# Patient Record
Sex: Female | Born: 1935 | Race: White | Hispanic: No | Smoking: Never smoker
Health system: Southern US, Community
[De-identification: ages and names within clinical notes are randomized; demographics above are authoritative.]

## PROBLEM LIST (undated history)

## (undated) DIAGNOSIS — M1990 Unspecified osteoarthritis, unspecified site: Secondary | ICD-10-CM

## (undated) DIAGNOSIS — E785 Hyperlipidemia, unspecified: Secondary | ICD-10-CM

## (undated) DIAGNOSIS — E669 Obesity, unspecified: Secondary | ICD-10-CM

## (undated) DIAGNOSIS — J309 Allergic rhinitis, unspecified: Secondary | ICD-10-CM

## (undated) DIAGNOSIS — I10 Essential (primary) hypertension: Secondary | ICD-10-CM

## (undated) DIAGNOSIS — I890 Lymphedema, not elsewhere classified: Secondary | ICD-10-CM

## (undated) DIAGNOSIS — N183 Chronic kidney disease, stage 3 unspecified: Secondary | ICD-10-CM

## (undated) DIAGNOSIS — Z0389 Encounter for observation for other suspected diseases and conditions ruled out: Secondary | ICD-10-CM

## (undated) DIAGNOSIS — Z9289 Personal history of other medical treatment: Secondary | ICD-10-CM

## (undated) DIAGNOSIS — I480 Paroxysmal atrial fibrillation: Secondary | ICD-10-CM

## (undated) DIAGNOSIS — F0390 Unspecified dementia without behavioral disturbance: Secondary | ICD-10-CM

## (undated) DIAGNOSIS — C541 Malignant neoplasm of endometrium: Secondary | ICD-10-CM

## (undated) DIAGNOSIS — H269 Unspecified cataract: Secondary | ICD-10-CM

## (undated) DIAGNOSIS — K219 Gastro-esophageal reflux disease without esophagitis: Secondary | ICD-10-CM

## (undated) HISTORY — PX: CHOLECYSTECTOMY: SHX55

## (undated) HISTORY — PX: BACK SURGERY: SHX140

## (undated) HISTORY — DX: Malignant neoplasm of endometrium: C54.1

## (undated) HISTORY — DX: Hyperlipidemia, unspecified: E78.5

## (undated) HISTORY — DX: Gastro-esophageal reflux disease without esophagitis: K21.9

## (undated) HISTORY — DX: Chronic kidney disease, stage 3 (moderate): N18.3

## (undated) HISTORY — DX: Unspecified cataract: H26.9

## (undated) HISTORY — DX: Chronic kidney disease, stage 3 unspecified: N18.30

## (undated) HISTORY — DX: Unspecified osteoarthritis, unspecified site: M19.90

## (undated) HISTORY — PX: VAGINAL HYSTERECTOMY: SUR661

## (undated) HISTORY — DX: Obesity, unspecified: E66.9

## (undated) HISTORY — DX: Paroxysmal atrial fibrillation: I48.0

## (undated) HISTORY — DX: Allergic rhinitis, unspecified: J30.9

## (undated) HISTORY — DX: Essential (primary) hypertension: I10

## (undated) HISTORY — DX: Personal history of other medical treatment: Z92.89

## (undated) HISTORY — DX: Lymphedema, not elsewhere classified: I89.0

## (undated) HISTORY — PX: APPENDECTOMY: SHX54

## (undated) HISTORY — DX: Encounter for observation for other suspected diseases and conditions ruled out: Z03.89

---

## 1998-03-18 ENCOUNTER — Other Ambulatory Visit: Admission: RE | Admit: 1998-03-18 | Discharge: 1998-03-18 | Payer: Self-pay | Admitting: Gynecology

## 1999-06-17 ENCOUNTER — Other Ambulatory Visit: Admission: RE | Admit: 1999-06-17 | Discharge: 1999-06-17 | Payer: Self-pay | Admitting: Obstetrics and Gynecology

## 2000-03-11 ENCOUNTER — Inpatient Hospital Stay (HOSPITAL_COMMUNITY): Admission: EM | Admit: 2000-03-11 | Discharge: 2000-03-14 | Payer: Self-pay | Admitting: Emergency Medicine

## 2000-03-11 ENCOUNTER — Encounter: Payer: Self-pay | Admitting: Family Medicine

## 2000-03-13 ENCOUNTER — Encounter: Payer: Self-pay | Admitting: Internal Medicine

## 2000-04-11 ENCOUNTER — Encounter: Admission: RE | Admit: 2000-04-11 | Discharge: 2000-07-10 | Payer: Self-pay | Admitting: *Deleted

## 2000-05-06 ENCOUNTER — Ambulatory Visit (HOSPITAL_COMMUNITY): Admission: RE | Admit: 2000-05-06 | Discharge: 2000-05-06 | Payer: Self-pay | Admitting: Internal Medicine

## 2000-05-06 ENCOUNTER — Encounter: Payer: Self-pay | Admitting: Internal Medicine

## 2000-08-19 ENCOUNTER — Other Ambulatory Visit: Admission: RE | Admit: 2000-08-19 | Discharge: 2000-08-19 | Payer: Self-pay | Admitting: Gynecology

## 2001-01-08 ENCOUNTER — Emergency Department (HOSPITAL_COMMUNITY): Admission: EM | Admit: 2001-01-08 | Discharge: 2001-01-08 | Payer: Self-pay | Admitting: Emergency Medicine

## 2001-01-08 ENCOUNTER — Encounter: Payer: Self-pay | Admitting: Emergency Medicine

## 2001-06-26 ENCOUNTER — Encounter: Payer: Self-pay | Admitting: Family Medicine

## 2001-06-26 ENCOUNTER — Ambulatory Visit (HOSPITAL_COMMUNITY): Admission: RE | Admit: 2001-06-26 | Discharge: 2001-06-26 | Payer: Self-pay | Admitting: Family Medicine

## 2001-11-06 ENCOUNTER — Encounter: Payer: Self-pay | Admitting: Family Medicine

## 2001-11-06 ENCOUNTER — Other Ambulatory Visit: Admission: RE | Admit: 2001-11-06 | Discharge: 2001-11-06 | Payer: Self-pay | Admitting: Family Medicine

## 2001-11-06 ENCOUNTER — Ambulatory Visit (HOSPITAL_COMMUNITY): Admission: RE | Admit: 2001-11-06 | Discharge: 2001-11-06 | Payer: Self-pay | Admitting: Family Medicine

## 2001-11-16 ENCOUNTER — Ambulatory Visit (HOSPITAL_COMMUNITY): Admission: RE | Admit: 2001-11-16 | Discharge: 2001-11-16 | Payer: Self-pay | Admitting: Cardiology

## 2001-11-17 ENCOUNTER — Ambulatory Visit (HOSPITAL_COMMUNITY): Admission: RE | Admit: 2001-11-17 | Discharge: 2001-11-17 | Payer: Self-pay | Admitting: Cardiology

## 2002-03-18 ENCOUNTER — Ambulatory Visit (HOSPITAL_BASED_OUTPATIENT_CLINIC_OR_DEPARTMENT_OTHER): Admission: RE | Admit: 2002-03-18 | Discharge: 2002-03-18 | Payer: Self-pay | Admitting: Family Medicine

## 2002-12-04 ENCOUNTER — Encounter: Admission: RE | Admit: 2002-12-04 | Discharge: 2003-03-04 | Payer: Self-pay | Admitting: Family Medicine

## 2003-06-06 ENCOUNTER — Encounter: Admission: RE | Admit: 2003-06-06 | Discharge: 2003-09-04 | Payer: Self-pay | Admitting: Family Medicine

## 2003-07-31 ENCOUNTER — Ambulatory Visit (HOSPITAL_BASED_OUTPATIENT_CLINIC_OR_DEPARTMENT_OTHER): Admission: RE | Admit: 2003-07-31 | Discharge: 2003-07-31 | Payer: Self-pay | Admitting: Orthopedic Surgery

## 2003-07-31 ENCOUNTER — Ambulatory Visit (HOSPITAL_COMMUNITY): Admission: RE | Admit: 2003-07-31 | Discharge: 2003-07-31 | Payer: Self-pay | Admitting: Orthopedic Surgery

## 2004-06-04 ENCOUNTER — Other Ambulatory Visit: Admission: RE | Admit: 2004-06-04 | Discharge: 2004-06-04 | Payer: Self-pay | Admitting: Family Medicine

## 2008-03-15 DIAGNOSIS — C541 Malignant neoplasm of endometrium: Secondary | ICD-10-CM

## 2008-03-15 HISTORY — DX: Malignant neoplasm of endometrium: C54.1

## 2009-01-08 ENCOUNTER — Encounter (INDEPENDENT_AMBULATORY_CARE_PROVIDER_SITE_OTHER): Payer: Self-pay | Admitting: *Deleted

## 2009-01-11 ENCOUNTER — Inpatient Hospital Stay (HOSPITAL_COMMUNITY): Admission: EM | Admit: 2009-01-11 | Discharge: 2009-01-12 | Payer: Self-pay | Admitting: Emergency Medicine

## 2009-01-12 ENCOUNTER — Encounter (INDEPENDENT_AMBULATORY_CARE_PROVIDER_SITE_OTHER): Payer: Self-pay | Admitting: Internal Medicine

## 2009-02-25 ENCOUNTER — Encounter (INDEPENDENT_AMBULATORY_CARE_PROVIDER_SITE_OTHER): Payer: Self-pay | Admitting: *Deleted

## 2009-03-04 ENCOUNTER — Ambulatory Visit: Payer: Self-pay | Admitting: Internal Medicine

## 2009-03-04 ENCOUNTER — Encounter (INDEPENDENT_AMBULATORY_CARE_PROVIDER_SITE_OTHER): Payer: Self-pay | Admitting: *Deleted

## 2009-03-05 ENCOUNTER — Telehealth (INDEPENDENT_AMBULATORY_CARE_PROVIDER_SITE_OTHER): Payer: Self-pay | Admitting: *Deleted

## 2009-03-15 DIAGNOSIS — IMO0001 Reserved for inherently not codable concepts without codable children: Secondary | ICD-10-CM

## 2009-03-15 HISTORY — DX: Reserved for inherently not codable concepts without codable children: IMO0001

## 2009-03-20 ENCOUNTER — Ambulatory Visit: Payer: Self-pay | Admitting: Internal Medicine

## 2009-05-05 ENCOUNTER — Encounter (INDEPENDENT_AMBULATORY_CARE_PROVIDER_SITE_OTHER): Payer: Self-pay | Admitting: Internal Medicine

## 2009-06-13 HISTORY — PX: CARDIAC CATHETERIZATION: SHX172

## 2009-06-19 ENCOUNTER — Emergency Department (HOSPITAL_COMMUNITY): Admission: EM | Admit: 2009-06-19 | Discharge: 2009-06-19 | Payer: Self-pay | Admitting: Emergency Medicine

## 2009-07-01 ENCOUNTER — Inpatient Hospital Stay (HOSPITAL_BASED_OUTPATIENT_CLINIC_OR_DEPARTMENT_OTHER): Admission: RE | Admit: 2009-07-01 | Discharge: 2009-07-01 | Payer: Self-pay | Admitting: Cardiovascular Disease

## 2009-08-07 ENCOUNTER — Ambulatory Visit (HOSPITAL_COMMUNITY): Admission: RE | Admit: 2009-08-07 | Discharge: 2009-08-07 | Payer: Self-pay | Admitting: Family Medicine

## 2009-08-12 ENCOUNTER — Encounter: Admission: RE | Admit: 2009-08-12 | Discharge: 2009-08-12 | Payer: Self-pay | Admitting: Orthopedic Surgery

## 2009-08-15 ENCOUNTER — Inpatient Hospital Stay (HOSPITAL_COMMUNITY): Admission: RE | Admit: 2009-08-15 | Discharge: 2009-08-19 | Payer: Self-pay | Admitting: Specialist

## 2009-08-18 ENCOUNTER — Ambulatory Visit: Payer: Self-pay | Admitting: Physical Medicine & Rehabilitation

## 2009-11-10 ENCOUNTER — Encounter: Admission: RE | Admit: 2009-11-10 | Discharge: 2009-11-24 | Payer: Self-pay | Admitting: Specialist

## 2010-01-20 ENCOUNTER — Ambulatory Visit: Payer: Self-pay | Admitting: Cardiovascular Disease

## 2010-02-19 ENCOUNTER — Inpatient Hospital Stay (HOSPITAL_COMMUNITY): Admission: EM | Admit: 2010-02-19 | Discharge: 2009-05-06 | Payer: Self-pay | Admitting: Emergency Medicine

## 2010-04-16 NOTE — Procedures (Signed)
Summary: Colonoscopy  Patient: [name] Note: All result statuses are Final unless otherwise noted.  Tests: (1) Colonoscopy (COL)   COL Colonoscopy           DONE     [facility]     [address]     [place], [place]  [address]           COLONOSCOPY PROCEDURE REPORT           PATIENT:  Janice [name], Janice [name]  MR#:  [id number]     BIRTHDATE:  [birth year], 77 yrs. old  GENDER:  female           ENDOSCOPIST:  Hedwig Morton. Juanda Chance, MD     Referred by:  Laurann Montana, M.D.           PROCEDURE DATE:  03/20/2009     PROCEDURE:  Colonoscopy 19147     ASA CLASS:  Class II     INDICATIONS:  history of polyps, family history of colon cancer     mother and 2 sisters with colon cancer,     prior colon [phone] with polyps,     last colon 2003 no polyps     hx of uterine cancer, s/p radiation           MEDICATIONS:   Versed 5 mg, Fentanyl 50 mcg           DESCRIPTION OF PROCEDURE:   After the risks benefits and     alternatives of the procedure were thoroughly explained, informed     consent was obtained.  Digital rectal exam was performed and     revealed no rectal masses.   The LB [id number]-[id number] endoscope     was introduced through the anus and advanced to the cecum, which     was identified by both the appendix and ileocecal valve, without     limitations.  The quality of the prep was good, using MiraLax.     The instrument was then slowly withdrawn as the colon was fully     examined.     <<PROCEDUREIMAGES>>           FINDINGS:  No polyps or cancers were seen (see image1, image2,     image3, and image4).   Retroflexed views in the rectum revealed no     abnormalities.    The scope was then withdrawn from the patient     and the procedure completed.           COMPLICATIONS:  None           ENDOSCOPIC IMPRESSION:     1) No polyps or cancers     2) Normal colonoscopy     RECOMMENDATIONS:     1) high fiber diet           REPEAT EXAM:  In 5 year(s) for.        ______________________________     Hedwig Morton. Juanda Chance, MD           CC:           n.     eSIGNED:   Hedwig Morton. [place] at 03/20/2009 10:27 AM           Almedia Balls, [id number]  Note: An exclamation mark (!) indicates a result that was not dispersed into the flowsheet. Document Creation Date: 03/20/2009 1:00 PM _______________________________________________________________________  (1) Order result status: Final Collection or observation date-time: 03/20/2009 10:20 Requested date-time:  Receipt date-time:  Reported date-time:  Referring Physician:   Ordering Physician: Lina Sar [phone]) Specimen Source:  Source: Launa Grill Order Number: [id number] Lab site:   Appended Document: Colonoscopy    Clinical Lists Changes  Observations: Added new observation of COLONNXTDUE: 03/2014 (03/20/2009 13:22)

## 2010-05-30 LAB — GLUCOSE, CAPILLARY
Glucose-Capillary: 105 mg/dL — ABNORMAL HIGH (ref 70–99)
Glucose-Capillary: 107 mg/dL — ABNORMAL HIGH (ref 70–99)

## 2010-06-01 LAB — URINE MICROSCOPIC-ADD ON

## 2010-06-01 LAB — GLUCOSE, CAPILLARY
Glucose-Capillary: 102 mg/dL — ABNORMAL HIGH (ref 70–99)
Glucose-Capillary: 104 mg/dL — ABNORMAL HIGH (ref 70–99)
Glucose-Capillary: 111 mg/dL — ABNORMAL HIGH (ref 70–99)
Glucose-Capillary: 112 mg/dL — ABNORMAL HIGH (ref 70–99)
Glucose-Capillary: 115 mg/dL — ABNORMAL HIGH (ref 70–99)
Glucose-Capillary: 116 mg/dL — ABNORMAL HIGH (ref 70–99)
Glucose-Capillary: 117 mg/dL — ABNORMAL HIGH (ref 70–99)
Glucose-Capillary: 124 mg/dL — ABNORMAL HIGH (ref 70–99)
Glucose-Capillary: 137 mg/dL — ABNORMAL HIGH (ref 70–99)
Glucose-Capillary: 147 mg/dL — ABNORMAL HIGH (ref 70–99)

## 2010-06-01 LAB — COMPREHENSIVE METABOLIC PANEL
ALT: 96 U/L — ABNORMAL HIGH (ref 0–35)
Alkaline Phosphatase: 53 U/L (ref 39–117)
BUN: 31 mg/dL — ABNORMAL HIGH (ref 6–23)
CO2: 27 mEq/L (ref 19–32)
Chloride: 97 mEq/L (ref 96–112)
GFR calc non Af Amer: 54 mL/min — ABNORMAL LOW (ref >60)
Potassium: 4.3 mEq/L (ref 3.5–5.1)
Sodium: 132 mEq/L — ABNORMAL LOW (ref 135–145)
Total Bilirubin: 0.6 mg/dL (ref 0.3–1.2)
Total Protein: 6.6 g/dL (ref 6.0–8.3)

## 2010-06-01 LAB — URINALYSIS, ROUTINE W REFLEX MICROSCOPIC
Bilirubin Urine: NEGATIVE
Ketones, ur: NEGATIVE mg/dL
Nitrite: NEGATIVE
Protein, ur: NEGATIVE mg/dL

## 2010-06-01 LAB — CBC
Platelets: 231 10*3/uL (ref 150–400)
RBC: 4.21 MIL/uL (ref 3.87–5.11)
RDW: 14.4 % (ref 11.5–15.5)

## 2010-06-01 LAB — PROTIME-INR
INR: 1.19 (ref 0.00–1.49)
Prothrombin Time: 15 seconds (ref 11.6–15.2)

## 2010-06-01 LAB — HEMOGLOBIN AND HEMATOCRIT, BLOOD
HCT: 29.1 % — ABNORMAL LOW (ref 36.0–46.0)
Hemoglobin: 10.1 g/dL — ABNORMAL LOW (ref 12.0–15.0)

## 2010-06-01 LAB — HEMOGLOBIN A1C
Hgb A1c MFr Bld: 5.9 % — ABNORMAL HIGH (ref <5.7)
Mean Plasma Glucose: 123 mg/dL — ABNORMAL HIGH (ref <117)

## 2010-06-01 LAB — CARDIAC PANEL(CRET KIN+CKTOT+MB+TROPI)
CK, MB: 1.6 ng/mL (ref 0.3–4.0)
Relative Index: INVALID (ref 0.0–2.5)
Troponin I: 0.01 ng/mL (ref 0.00–0.06)

## 2010-06-01 LAB — ABO/RH: ABO/RH(D): O POS

## 2010-06-03 LAB — DIFFERENTIAL
Eosinophils Absolute: 0.1 10*3/uL (ref 0.0–0.7)
Eosinophils Relative: 2 % (ref 0–5)
Lymphs Abs: 2.6 10*3/uL (ref 0.7–4.0)
Monocytes Relative: 13 % — ABNORMAL HIGH (ref 3–12)
Neutrophils Relative %: 50 % (ref 43–77)

## 2010-06-03 LAB — PROTIME-INR: INR: 2.74 — ABNORMAL HIGH (ref 0.00–1.49)

## 2010-06-03 LAB — CBC
HCT: 33.5 % — ABNORMAL LOW (ref 36.0–46.0)
MCV: 90.5 fL (ref 78.0–100.0)
RBC: 3.7 MIL/uL — ABNORMAL LOW (ref 3.87–5.11)
WBC: 7.8 10*3/uL (ref 4.0–10.5)

## 2010-06-03 LAB — BASIC METABOLIC PANEL
Chloride: 101 mEq/L (ref 96–112)
GFR calc Af Amer: >60 mL/min (ref >60)
Potassium: 4.2 mEq/L (ref 3.5–5.1)
Sodium: 136 mEq/L (ref 135–145)

## 2010-06-03 LAB — POCT CARDIAC MARKERS
CKMB, poc: 1.1 ng/mL (ref 1.0–8.0)
Myoglobin, poc: 99.5 ng/mL (ref 12–200)

## 2010-06-03 LAB — BRAIN NATRIURETIC PEPTIDE: Pro B Natriuretic peptide (BNP): 39 pg/mL (ref 0.0–100.0)

## 2010-06-04 LAB — CBC
HCT: 33 % — ABNORMAL LOW (ref 36.0–46.0)
HCT: 33.5 % — ABNORMAL LOW (ref 36.0–46.0)
Hemoglobin: 11 g/dL — ABNORMAL LOW (ref 12.0–15.0)
MCHC: 33.2 g/dL (ref 30.0–36.0)
MCHC: 34.7 g/dL (ref 30.0–36.0)
MCV: 91 fL (ref 78.0–100.0)
MCV: 91 fL (ref 78.0–100.0)
Platelets: 128 10*3/uL — ABNORMAL LOW (ref 150–400)
Platelets: 142 10*3/uL — ABNORMAL LOW (ref 150–400)
Platelets: 168 10*3/uL (ref 150–400)
RBC: 3.63 MIL/uL — ABNORMAL LOW (ref 3.87–5.11)
RBC: 3.69 MIL/uL — ABNORMAL LOW (ref 3.87–5.11)
RBC: 3.75 MIL/uL — ABNORMAL LOW (ref 3.87–5.11)
RDW: 12.9 % (ref 11.5–15.5)
WBC: 8.2 10*3/uL (ref 4.0–10.5)
WBC: 8.7 10*3/uL (ref 4.0–10.5)

## 2010-06-04 LAB — DIFFERENTIAL
Lymphocytes Relative: 33 % (ref 12–46)
Lymphs Abs: 2.9 10*3/uL (ref 0.7–4.0)
Monocytes Absolute: 1 10*3/uL (ref 0.1–1.0)
Monocytes Relative: 12 % (ref 3–12)
Neutro Abs: 4.4 10*3/uL (ref 1.7–7.7)
Neutrophils Relative %: 50 % (ref 43–77)

## 2010-06-04 LAB — BASIC METABOLIC PANEL
BUN: 12 mg/dL (ref 6–23)
BUN: 18 mg/dL (ref 6–23)
CO2: 26 mEq/L (ref 19–32)
CO2: 28 mEq/L (ref 19–32)
Calcium: 8.8 mg/dL (ref 8.4–10.5)
Calcium: 9.1 mg/dL (ref 8.4–10.5)
Chloride: 101 mEq/L (ref 96–112)
Creatinine, Ser: 0.8 mg/dL (ref 0.4–1.2)
Creatinine, Ser: 0.93 mg/dL (ref 0.4–1.2)
GFR calc Af Amer: >60 mL/min (ref >60)
GFR calc Af Amer: >60 mL/min (ref >60)
Glucose, Bld: 101 mg/dL — ABNORMAL HIGH (ref 70–99)
Sodium: 136 mEq/L (ref 135–145)

## 2010-06-04 LAB — PROTIME-INR
INR: 1.32 (ref 0.00–1.49)
INR: 2.39 — ABNORMAL HIGH (ref 0.00–1.49)
Prothrombin Time: 14.3 seconds (ref 11.6–15.2)
Prothrombin Time: 16.3 seconds — ABNORMAL HIGH (ref 11.6–15.2)

## 2010-06-04 LAB — COMPREHENSIVE METABOLIC PANEL
AST: 25 U/L (ref 0–37)
BUN: 13 mg/dL (ref 6–23)
CO2: 26 mEq/L (ref 19–32)
Chloride: 107 mEq/L (ref 96–112)
Creatinine, Ser: 0.88 mg/dL (ref 0.4–1.2)
GFR calc non Af Amer: >60 mL/min (ref >60)
Glucose, Bld: 108 mg/dL — ABNORMAL HIGH (ref 70–99)
Total Bilirubin: 0.5 mg/dL (ref 0.3–1.2)

## 2010-06-04 LAB — POCT I-STAT, CHEM 8
Creatinine, Ser: 0.9 mg/dL (ref 0.4–1.2)
HCT: 34 % — ABNORMAL LOW (ref 36.0–46.0)
Hemoglobin: 11.6 g/dL — ABNORMAL LOW (ref 12.0–15.0)
Potassium: 3.5 mEq/L (ref 3.5–5.1)
Sodium: 134 mEq/L — ABNORMAL LOW (ref 135–145)

## 2010-06-04 LAB — HEPARIN LEVEL (UNFRACTIONATED)
Heparin Unfractionated: 0.72 IU/mL — ABNORMAL HIGH (ref 0.30–0.70)
Heparin Unfractionated: 1.25 IU/mL — ABNORMAL HIGH (ref 0.30–0.70)

## 2010-06-04 LAB — CK TOTAL AND CKMB (NOT AT ARMC)
Relative Index: INVALID (ref 0.0–2.5)
Relative Index: INVALID (ref 0.0–2.5)

## 2010-06-04 LAB — LIPID PANEL
Cholesterol: 116 mg/dL (ref 0–200)
HDL: 57 mg/dL (ref >39)
LDL Cholesterol: 49 mg/dL (ref 0–99)

## 2010-06-04 LAB — TSH: TSH: 2.382 u[IU]/mL (ref 0.350–4.500)

## 2010-06-04 LAB — TROPONIN I
Troponin I: 0.02 ng/mL (ref 0.00–0.06)
Troponin I: 0.03 ng/mL (ref 0.00–0.06)

## 2010-06-04 LAB — GLUCOSE, CAPILLARY
Glucose-Capillary: 107 mg/dL — ABNORMAL HIGH (ref 70–99)
Glucose-Capillary: 84 mg/dL (ref 70–99)
Glucose-Capillary: 91 mg/dL (ref 70–99)
Glucose-Capillary: 91 mg/dL (ref 70–99)

## 2010-06-04 LAB — POCT CARDIAC MARKERS
CKMB, poc: <1 ng/mL — ABNORMAL LOW (ref 1.0–8.0)
Myoglobin, poc: 112 ng/mL (ref 12–200)
Myoglobin, poc: 93.4 ng/mL (ref 12–200)

## 2010-06-18 LAB — GLUCOSE, CAPILLARY

## 2010-06-18 LAB — CARDIAC PANEL(CRET KIN+CKTOT+MB+TROPI)
CK, MB: 1 ng/mL (ref 0.3–4.0)
Relative Index: INVALID (ref 0.0–2.5)
Total CK: 60 U/L (ref 7–177)
Troponin I: 0.01 ng/mL (ref 0.00–0.06)

## 2010-06-18 LAB — DIFFERENTIAL
Basophils Absolute: 0 10*3/uL (ref 0.0–0.1)
Basophils Relative: 0 % (ref 0–1)
Eosinophils Absolute: 0.2 10*3/uL (ref 0.0–0.7)
Lymphs Abs: 2.2 10*3/uL (ref 0.7–4.0)
Neutrophils Relative %: 64 % (ref 43–77)

## 2010-06-18 LAB — BASIC METABOLIC PANEL
BUN: 19 mg/dL (ref 6–23)
Chloride: 103 mEq/L (ref 96–112)
Glucose, Bld: 104 mg/dL — ABNORMAL HIGH (ref 70–99)
Potassium: 3.8 mEq/L (ref 3.5–5.1)
Sodium: 137 mEq/L (ref 135–145)

## 2010-06-18 LAB — POCT I-STAT, CHEM 8
Calcium, Ion: 1.14 mmol/L (ref 1.12–1.32)
Chloride: 101 mEq/L (ref 96–112)
HCT: 41 % (ref 36.0–46.0)
Hemoglobin: 13.9 g/dL (ref 12.0–15.0)
TCO2: 25 mmol/L (ref 0–100)

## 2010-06-18 LAB — CBC
HCT: 34 % — ABNORMAL LOW (ref 36.0–46.0)
HCT: 39.7 % (ref 36.0–46.0)
Hemoglobin: 11.4 g/dL — ABNORMAL LOW (ref 12.0–15.0)
MCV: 92.2 fL (ref 78.0–100.0)
Platelets: 159 10*3/uL (ref 150–400)
Platelets: 196 10*3/uL (ref 150–400)
RDW: 13.3 % (ref 11.5–15.5)
WBC: 9.6 10*3/uL (ref 4.0–10.5)

## 2010-06-18 LAB — LIPID PANEL
Cholesterol: 109 mg/dL (ref 0–200)
LDL Cholesterol: 52 mg/dL (ref 0–99)

## 2010-06-18 LAB — CK TOTAL AND CKMB (NOT AT ARMC)
CK, MB: 1.4 ng/mL (ref 0.3–4.0)
Total CK: 78 U/L (ref 7–177)

## 2010-06-18 LAB — POCT CARDIAC MARKERS

## 2010-06-18 LAB — BRAIN NATRIURETIC PEPTIDE: Pro B Natriuretic peptide (BNP): <30 pg/mL (ref 0.0–100.0)

## 2010-06-18 LAB — TROPONIN I: Troponin I: 0.01 ng/mL (ref 0.00–0.06)

## 2010-07-16 ENCOUNTER — Encounter: Payer: Self-pay | Admitting: Cardiovascular Disease

## 2010-07-16 DIAGNOSIS — I4891 Unspecified atrial fibrillation: Secondary | ICD-10-CM | POA: Insufficient documentation

## 2010-07-16 DIAGNOSIS — R079 Chest pain, unspecified: Secondary | ICD-10-CM | POA: Insufficient documentation

## 2010-07-16 DIAGNOSIS — I10 Essential (primary) hypertension: Secondary | ICD-10-CM | POA: Insufficient documentation

## 2010-07-16 DIAGNOSIS — E785 Hyperlipidemia, unspecified: Secondary | ICD-10-CM | POA: Insufficient documentation

## 2010-07-16 DIAGNOSIS — E119 Type 2 diabetes mellitus without complications: Secondary | ICD-10-CM | POA: Insufficient documentation

## 2010-07-22 ENCOUNTER — Ambulatory Visit (INDEPENDENT_AMBULATORY_CARE_PROVIDER_SITE_OTHER): Payer: Medicare Other | Admitting: Cardiovascular Disease

## 2010-07-22 ENCOUNTER — Encounter: Payer: Self-pay | Admitting: Cardiovascular Disease

## 2010-07-22 VITALS — BP 110/74 | HR 62 | Wt 180.0 lb

## 2010-07-22 DIAGNOSIS — I10 Essential (primary) hypertension: Secondary | ICD-10-CM

## 2010-07-22 DIAGNOSIS — E785 Hyperlipidemia, unspecified: Secondary | ICD-10-CM

## 2010-07-22 DIAGNOSIS — I4891 Unspecified atrial fibrillation: Secondary | ICD-10-CM

## 2010-07-22 MED ORDER — DABIGATRAN ETEXILATE MESYLATE 150 MG PO CAPS: 150.0000 mg | ORAL_CAPSULE | Freq: Two times a day (BID) | ORAL | Status: DC

## 2010-07-22 NOTE — Progress Notes (Signed)
Janice [name] Date of Birth  27-Nov-1931 [facility] / [facility] [address]     [address] [place], [place]  [address] [phone]  Fax  [phone]  History of Present Illness:  Janice [name] is an elderly female with a history of atrial fibrillation, hypertension, and hyperlipidemia. She's been in normal sinus rhythm for the past several office visits. She's been exercising at the [facility] several times a week ( water aerobics).  No chest pain or dyspnea.  Occasionally has palpitations.    Current Outpatient Prescriptions on File Prior to Visit  Medication Sig Dispense Refill  . aspirin 81 MG tablet Take 81 mg by mouth daily.        . Cyanocobalamin (B-12 PO) Take by mouth daily.        . dabigatran (PRADAXA) 150 MG CAPS Take 150 mg by mouth every 12 (twelve) hours.        . Diclofenac Sodium (PENNSAID) 1.5 % SOLN Place onto the skin as needed.        . fluticasone (FLONASE) 50 MCG/ACT nasal spray 2 sprays by Nasal route as needed.        Marland Kitchen losartan-hydrochlorothiazide (HYZAAR) 100-25 MG per tablet Take 1 tablet by mouth daily.        . metFORMIN (GLUMETZA) 1000 MG (MOD) 24 hr tablet Take 1,000 mg by mouth 2 (two) times daily with a meal.       . metoprolol (LOPRESSOR) 50 MG tablet Take 25 mg by mouth 2 (two) times daily.       . Multiple Minerals (CALCIUM/MAGNESIUM/ZINC PO) Take by mouth daily.        . nitroGLYCERIN (NITROSTAT) 0.4 MG SL tablet Place 0.4 mg under the tongue every 5 (five) minutes as needed.        . ranitidine (ZANTAC) 300 MG tablet Take 300 mg by mouth as needed.        . simvastatin (ZOCOR) 80 MG tablet Take 20 mg by mouth at bedtime.        . methocarbamol (ROBAXIN) 500 MG tablet Take 500 mg by mouth as needed.          Allergies  Allergen Reactions  . Meperidine Hcl     REACTION: nausea and vomiting    Past Medical History  Diagnosis Date  . Atrial fibrillation   . Diabetes mellitus   . Hypertension   . Hyperlipidemia   . Chest  pain     Past Surgical History  Procedure Date  . Cardiac catheterization 06/2009    REVEALED SMOOTH AND NORMAL CORONARY ARTERIES  . Back surgery     History  Smoking status  . Never Smoker   Smokeless tobacco  . Not on file    History  Alcohol Use No    Family History  Problem Relation Age of Onset  . Colon cancer Mother   . Kidney cancer Father   . Lung cancer Sister     Reviw of Systems:  Reviewed in the HPI.  All other systems are negative.  Physical Exam: BP 110/74  Pulse 62  Wt 180 lb (81.647 kg) The patient is alert and oriented x 3.  The mood and affect are normal.  The skin is warm and dry.  Color is normal.  The HEENT exam reveals that the sclera are nonicteric.  The mucous membranes are moist.  The carotids are 2+ without bruits.  There is no thyromegaly.  There is no JVD.  The  lungs are clear.  The chest wall is non tender.  The heart exam reveals a regular rate with a normal S1 and S2.  There are no murmurs, gallops, or rubs.  The PMI is not displaced.   Abdominal exam reveals good bowel sounds.  There is no guarding or rebound.  There is no hepatosplenomegaly or tenderness.  There are no masses.  Exam of the legs reveal no clubbing, cyanosis, or edema.  The legs are without rashes.  The distal pulses are intact.  Cranial nerves II - XII are intact.  Motor and sensory functions are intact.  The gait is normal.  ECG: Normal sinus rhythm. She has no ST or T wave changes. Assessment / Plan:

## 2010-07-22 NOTE — Assessment & Plan Note (Signed)
She is stable for the time being. I like to change her from simvastatin atorvastatin and becomes less expensive.

## 2010-07-22 NOTE — Assessment & Plan Note (Signed)
Normal is doing very well from a cardiac standpoint. We'll continue the Pradaxa. Fortunately, she remains in normal sinus rhythm. I'll see her in 6 months.

## 2010-07-22 NOTE — Patient Instructions (Signed)
I anticipate changing the Simvastatin to Atorvastatin when it becomes cheaper.

## 2010-07-22 NOTE — Assessment & Plan Note (Signed)
Her blood pressure remains fairly well controlled. We'll continue with her current medications

## 2010-07-31 NOTE — Discharge Summary (Signed)
[facility]  Patient:    Janice [name], Janice [name]                     MRN: [id number] Adm. Date:  [id number] Disc. Date: 03/14/00 Attending:  Selina Cooley CC:         Willis Modena. Dreiling, M.D.  Hedwig Morton. Juanda Chance, M.D. [facility]   Discharge Summary  DISCHARGE DIAGNOSES: 1. Pancreatitis, unclear etiology, probably secondary to choledocholithiasis    versus idiopathic.  Computed tomographic scan consistent with pancreatitis    of the uncinate process.  Peak lipase of 107, peak amylase of 148.    Discharge lipase of 18.  Liver function tests normal. 2. Ileus secondary to pancreatitis. 3. Status post cholecystectomy approximately nine years ago. 4. History of hypertension. 5. Hyperglycemia with a hemoglobin A1C of 6 and a strong family history of    diabetes mellitus. 6. History of colon polyps per colonoscopy in 1993, by Dr. Juanda Chance. 7. Status post total abdominal hysterectomy/bilateral salpingo-oophorectomy    for uterine cancer, by Dr. Lily Peer. 8. Degenerative joint disease of the lumbar spine.  DISCHARGE MEDICATIONS: 1. Maxzide 12.5 mg p.o. q.d. 2. Claritin-D p.r.n. 3. Multivitamin p.o. q.d. 4. Os-Cal D t.i.d. with meals. 5. Evista 60 mg p.o. q.d. (patient not taking). 6. Extra-strength Tylenol p.o. q.4-6h. p.r.n. pain.  DIET:  Low fat.  FOLLOW-UP:  Follow-up appointment with Dr. Lattie Corns on Thursday, March 24, 2000, at 11 a.m.  Dr. Juanda Chance on March 28, 2000, at 11:30 a.m.  CONDITION ON DISCHARGE:  The patient is alert and oriented, with stable vital signs, denying nausea and vomiting.  Endorsing much-improved abdominal pain with slight right upper quadrant tenderness.  Tolerating a full liquid diet. Discharge hemoglobin of 12.1, white count of 8.5.  Lipase of 18.  Creatinine of 0.8.  CONSULTATIONS:  Dr. Lina Sar.  LABORATORY DATA:  Abdominal CT on March 13, 2000, consistent with focal pancreatitis of the uncinate process.  No evidence of  pseudocyst or abscess. Ductal dilatation, mild fatty liver, and atelectasis of the left base. Abdominal ultrasound consistent with cholecystectomy.  Common bile duct of 4 mm.  Slight diffuse fatty liver.  Lipids:  Triglycerides of 67, LDL of 102.  REASON FOR ADMISSION:  The patient is a 77 year old white female with a past medical history for cholecystitis and cholecystectomy approximately nine years ago, who was admitted with acute onset of nausea, vomiting, and epigastric right upper quadrant pain radiating to the back.  Admission pancreatic enzymes elevated with a lipase of 107 and amylase of 148.  Abdominal x-ray on admission consistent with mild ileus.  HOSPITAL COURSE: #1 - PANCREATITIS:  Unclear etiology.  The patient was provided with antiemetics, IV Demerol, IV fluids, complete bowel rest, with gradual improvement in her symptoms.  She did have a transient worsening of symptoms once started back on clears; however, on discharge she is tolerating a full liquid diet.  Dr. Lina Sar of gastroenterology was invited to consult.  She suspected that this episode was secondary to choledocholithiasis.  A CT scan was obtained which was consistent with focal pancreatitis involving the uncinate process.  On discharge her pancreatic enzymes normalized.  LFTs were normal, as well, throughout the hospitalization.  The patient will follow up with Dr. Juanda Chance and ultimately undergo an ERCP, probably several weeks after discharge.  Of note, the patient has no significant alcohol history, is status post cholecystectomy, had normal triglycerides, and was on no culprit medications that could be associated with pancreatitis.  There is a chance that this could ultimately be idiopathic.  #2 - ILEUS SECONDARY TO PANCREATITIS:  Resolving on discharge.  #3 - HYPERTENSION:  The patient will be discharged on her usual medications.  #4 - HYPERGLYCEMIA:  Suspect borderline diabetes with hemoglobin A1C  of 6 and a strong family history of diabetes mellitus.  The patient would probably benefit from diabetes education with respect to lifestyle modification, i.e., exercise and low fat, low carbohydrate diet. DD:  03/14/00 TD:  03/14/00 Job: [id number]

## 2010-07-31 NOTE — Op Note (Signed)
   NAME:  Janice Janice [name], Janice Janice [name]                        ACCOUNT NO.:  [id number]   MEDICAL RECORD NO.:  [id number]                   PATIENT TYPE:  OIB   LOCATION:  2856                                 FACILITY:  [facility]   PHYSICIAN:  Traci R. Mayford Knife, M.D.               DATE OF BIRTH:  1932/02/23   DATE OF PROCEDURE:  11/17/2001  DATE OF DISCHARGE:  11/17/2001                                 OPERATIVE REPORT   PROCEDURE PERFORMED:  Tilt table test.   OPERATOR:  Traci R. Mayford Knife, M.D.   INDICATIONS FOR PROCEDURE:  Syncope and presyncope.   COMPLICATIONS:  None.   The patient is Janice [name] 77 year old white female with several episodes of  presyncope and one episode of syncope who how presents for tilt table  testing.  The patient was brought to the cardiac catheterization laboratory  in Janice [name] fasting non sedated state.  Informed consent was obtained.  The patient  was connected to continuous heart rate and pulse oximetry monitoring and  intermittent blood pressure monitoring.  The patient's blood pressure was  measured for Janice [name] total of 15 minutes supine and then she was tilted upright to  70 degrees and blood pressure monitored for 45 minutes.  Baseline blood  pressure ranged from 125/59 to 141/77.  Heart rates were in the 80s.  The  lowest blood pressure achieved during upright tilt was 112/72 mmHg with  heart rates remaining in the 80s during the entire tilt.  The patient was  completely asymptomatic during the study.   ASSESSMENT:  1. Presyncope and syncope.  2. Negative tilt table test for vasovagal orthostatic hypotension, vasovagal     syncope or orthostatic hypotension.   PLAN:  She is going to follow up with me in several weeks after her stress  test and 2D echocardiogram.                                               Traci R. Mayford Knife, M.D.    TRT/MEDQ  D:  11/17/2001  T:  11/20/2001  Job:  [id number]

## 2010-07-31 NOTE — H&P (Signed)
[facility]  Patient:    Janice [name], Janice [name]                     MRN: [id number] Adm. Date:  [id number] Attending:  Selina Cooley                         History and Physical  CHIEF COMPLAINT:  Nausea and abdominal pain.  HISTORY OF PRESENT ILLNESS:  A 77 year old white female with onset 4 days prior to admission of nausea and feeling of indigestion. She developed abdominal pain about 24 hours prior to her admission and vomited x 1 during that time. She presented to the emergency room at [facility] secondary to increasing pain. She states that it feels similar to gallbladder pain in the past. Demerol and Phenergan given in the emergency room helped only lasting about 2 hours before the pain returns.  PAST MEDICAL HISTORY:  Significant for hypertension, history of allergies as well as history of uterine cancer.  PAST SURGICAL HISTORY:  Status post cholecystectomy approximately 9 years ago and status post TAH and BSO via Dr. Lily Peer in approximately 1993 for the uterine cancer. History of colonic polyps followed by Dr. Juanda Chance (unclear what type).  MEDICATIONS:  Maxzide 25 mg 1/2 tablet q.d., Claritin D p.r.n., multivitamin daily with vitamin D (not taking calcium). E-Vista 6 mg q.d. has been prescribed but she forgets to take it.  ALLERGIES:  No known drug allergies.  FAMILY HISTORY:  Multiple family members with diabetes and some family members with stroke. Mother died earlier this month questionably secondary to colon cancer. Maternal aunt had colon cancer. Father died of lung cancer and sister also had lung cancer.  SOCIAL HISTORY:  No tobacco or alcohol.  REVIEW OF SYSTEMS:  Denies fever, congestion, chest pain, shortness of breath, diarrhea, constipation or urinary symptoms. She has some mild pedal edema which the Maxzide usually controls. By report has had normal bone densitometry. PHYSICAL EXAMINATION:  GENERAL:   Well-developed, well-nourished, in mild pain during exam but no acute distress.  VITAL SIGNS:  Temperature 98.4, blood pressure 186/76, pulse 106, respirations 24.  HEENT:  Normocephalic, atraumatic. Pupils equal round and reactive to light. Fundi normal. Tympanic membranes normal. Oropharynx normal.  NECK:  Supple without adenopathy, nontender.  LUNGS:  Clear.  HEART:  Regular rate and rhythm without murmur, gallop or rub.  ABDOMEN:  Positive bowel sounds, soft, mildly tender throughout especially in the epigastrium but no rebound or guarding.  EXTREMITIES:  Show 1+ pedal edema, normal DP and PT pulses.  NEUROLOGIC:  Grossly within normal limits. Muscle strength 5/5, DTRs 2+ and symmetrical.  GU/GYN:  Deferred to Dr. Colman Cater exam (the patient states she is up to date and has also done a mammogram).  LABORATORY DATA:  WBC 16.4, 79 neutrophils, and 11 lymphs. Hemoglobin 15.4, hematocrit 43.9, platelets 256,000. Amylase elevated at 148, lipase elevated at 107. AST and ALT normal. Sodium 136, potassium 3.6, chloride 99, CO2 30, BUN 12, creatinine 0.9. Glucose mildly elevated at 155. Urinalysis is normal.  Chest x-ray normal. Abdominal x-ray shows mild ileus but no obstruction.  IMPRESSION: 1. Pancreatitis although etiology is somewhat unclear since she does not drink    alcohol and had cholecystectomy 9 years ago. There is no elevation of liver    functions. Ileus is secondary to this. 2. Hypertension. 3. Mildly elevated glucose.  PLAN: 1. N.P.O., nasogastric tube if needed but has been  able to hold off so far,    IV fluids, Demerol and Phenergan for pain. 2. Check abdominal ultrasound, follow amylase and lipase as well as white    blood count today and will follow glucose as well. DD:  03/11/00 TD:  03/11/00 Job: [id number]

## 2010-07-31 NOTE — Op Note (Signed)
NAME:  Janice [name], Janice [name]                        ACCOUNT NO.:  [id number]   MEDICAL RECORD NO.:  [id number]                   PATIENT TYPE:  AMB   LOCATION:  DSC                                  FACILITY:  [facility]   PHYSICIAN:  Feliberto Gottron. Turner Daniels, M.D.                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   POSTOPERATIVE DIAGNOSIS:  Left knee lateral medial meniscal tear with  chondromalacia to the medial femoral condyle and the trochlea.   SURGERY:  1. Left knee arthroscopic partial medial meniscectomy, posterior horn     lateral meniscectomy, posterolateral horn.  2. Debridement of grade 3 from the medial femoral condyle and grade 2-3 from     the trochlea.   SURGEON:  Feliberto Gottron. Turner Daniels, M.D.   ANESTHESIA:  Local with IV sedation.   ESTIMATED BLOOD LOSS:  Minimal.   FLUIDS REPLACED:  700 mL crystalloid.   DRAINS PLACED:  None.   TOURNIQUET TIME:  None.   INDICATION FOR PROCEDURE:  [name] 77 year old woman with symptomatic left knee  medial meniscal tear as well as some arthritis by x-ray.  Her arthritis was  not bad enough to warrant any sort of total knee replacement, but she does  have medial joint line tenderness and has failed conservative treatment with  observation, exercise, anti-inflammatory medicines.  She desires elective  arthroscopic evaluation and treatment of her left knee.  Risks and benefits  of surgery were understood by the patient and her family.   DESCRIPTION OF PROCEDURE:  The patient identified by arm band, taken to the  operating room at [facility] day surgery center.  Appropriate anesthetic monitors  were attached and local anesthesia with IV sedation induced into the left  knee, [name] lateral post applied to the table, and the left lower extremity  prepped and draped in the usual sterile fashion from the  ankle to the mid-  thigh.  Using [name] #11 blade, standard inferomedial and inferolateral  peripatellar portals were then made allowing introduction of the arthroscope  through the inferolateral portal and the outflow through the inferomedial  portal.  Diagnostic arthroscopy revealed grade 2-3 chondromalacia of the  trochlea, which was debrided back to stable margins using [name] 4.2 Great White  sucker shaver, as was chondromalacia of the medial femoral condyle.  The  posterior horn of the medial meniscus had complex horizontal and parrot beak-  type tears, and this was debrided back to [name] stable margin and thoroughly  probed using straight biters and [name] 4.2 Great White sucker shaver.  The ACL  and the PCL were intact.  On the lateral side the patient had some  significant degenerative tear of the posterolateral horn of the lateral  meniscus, and this was probed and debrided as  well.  The gutters were  cleared.  The scope was taken medial and lateral to the PCL, clearing the posterior  compartment.  The knee was irrigated out with normal saline solution and [name]  dressing of Xeroform, 4 x 4 dressing sponges, Webril, and an Ace wrap  applied.  The patient was then awakened and taken to the recovery room  without difficulty.                                               Feliberto Gottron. Turner Daniels, M.D.    Ovid Curd  D:  07/31/2003  T:  08/01/2003  Job:  [id number]

## 2010-10-09 ENCOUNTER — Telehealth: Payer: Self-pay | Admitting: Cardiovascular Disease

## 2010-10-09 NOTE — Telephone Encounter (Signed)
Patient has some questions regarding her prescriptions

## 2010-10-09 NOTE — Telephone Encounter (Signed)
Pt cant continue to afford pradaxa, wants to go back on coumadin but wants PCP Laurann Montana to do lab draws because its cheaper, informed her that she needs to ask her pcp if she will draw labs and regulate her coumadin. She will call and find out and will let us know which way she will go. She has enough pradaxa for 2 months. Alfonso Ramus RN

## 2010-10-23 ENCOUNTER — Telehealth: Payer: Self-pay | Admitting: Cardiovascular Disease

## 2010-10-23 NOTE — Telephone Encounter (Signed)
Called because Dr. Cliffton Asters said that she will prescribe the Warfrin for her and that she would monitor it as long as it is ok with him. Please call back. I pulled her chart.

## 2010-10-23 NOTE — Telephone Encounter (Signed)
Spoke with patient she will finish approximately 1 more month of Pradaxa then get script for Coumadin from Dr. Cliffton Asters who will also monitor INR. As long as this is OK with Dr Elease Hashimoto. Note to be routed to Dr Elease Hashimoto and Kennon Rounds in the Coumadin clinic.

## 2010-10-26 ENCOUNTER — Encounter: Payer: Self-pay | Admitting: Cardiovascular Disease

## 2010-10-26 NOTE — Progress Notes (Signed)
This encounter was created in error - please disregard.

## 2010-10-26 NOTE — Telephone Encounter (Signed)
Ok with me if primary monitors / regulates coumadin / INR

## 2010-10-27 ENCOUNTER — Telehealth: Payer: Self-pay | Admitting: Cardiology

## 2010-10-27 NOTE — Telephone Encounter (Signed)
Pt informed that Dr Elease Hashimoto is ok with her getting her coumadin managed by Dr Cliffton Asters. Verbalizes understanding.

## 2011-04-13 IMAGING — CR DG LUMBAR SPINE 1V
1 series · 1 of 1 positions shown · non-contrast
Comparison: MR 08/07/2009

CLINICAL DATA: Spinal stenosis

LUMBAR SPINE - 1 VIEW

[view not recorded]
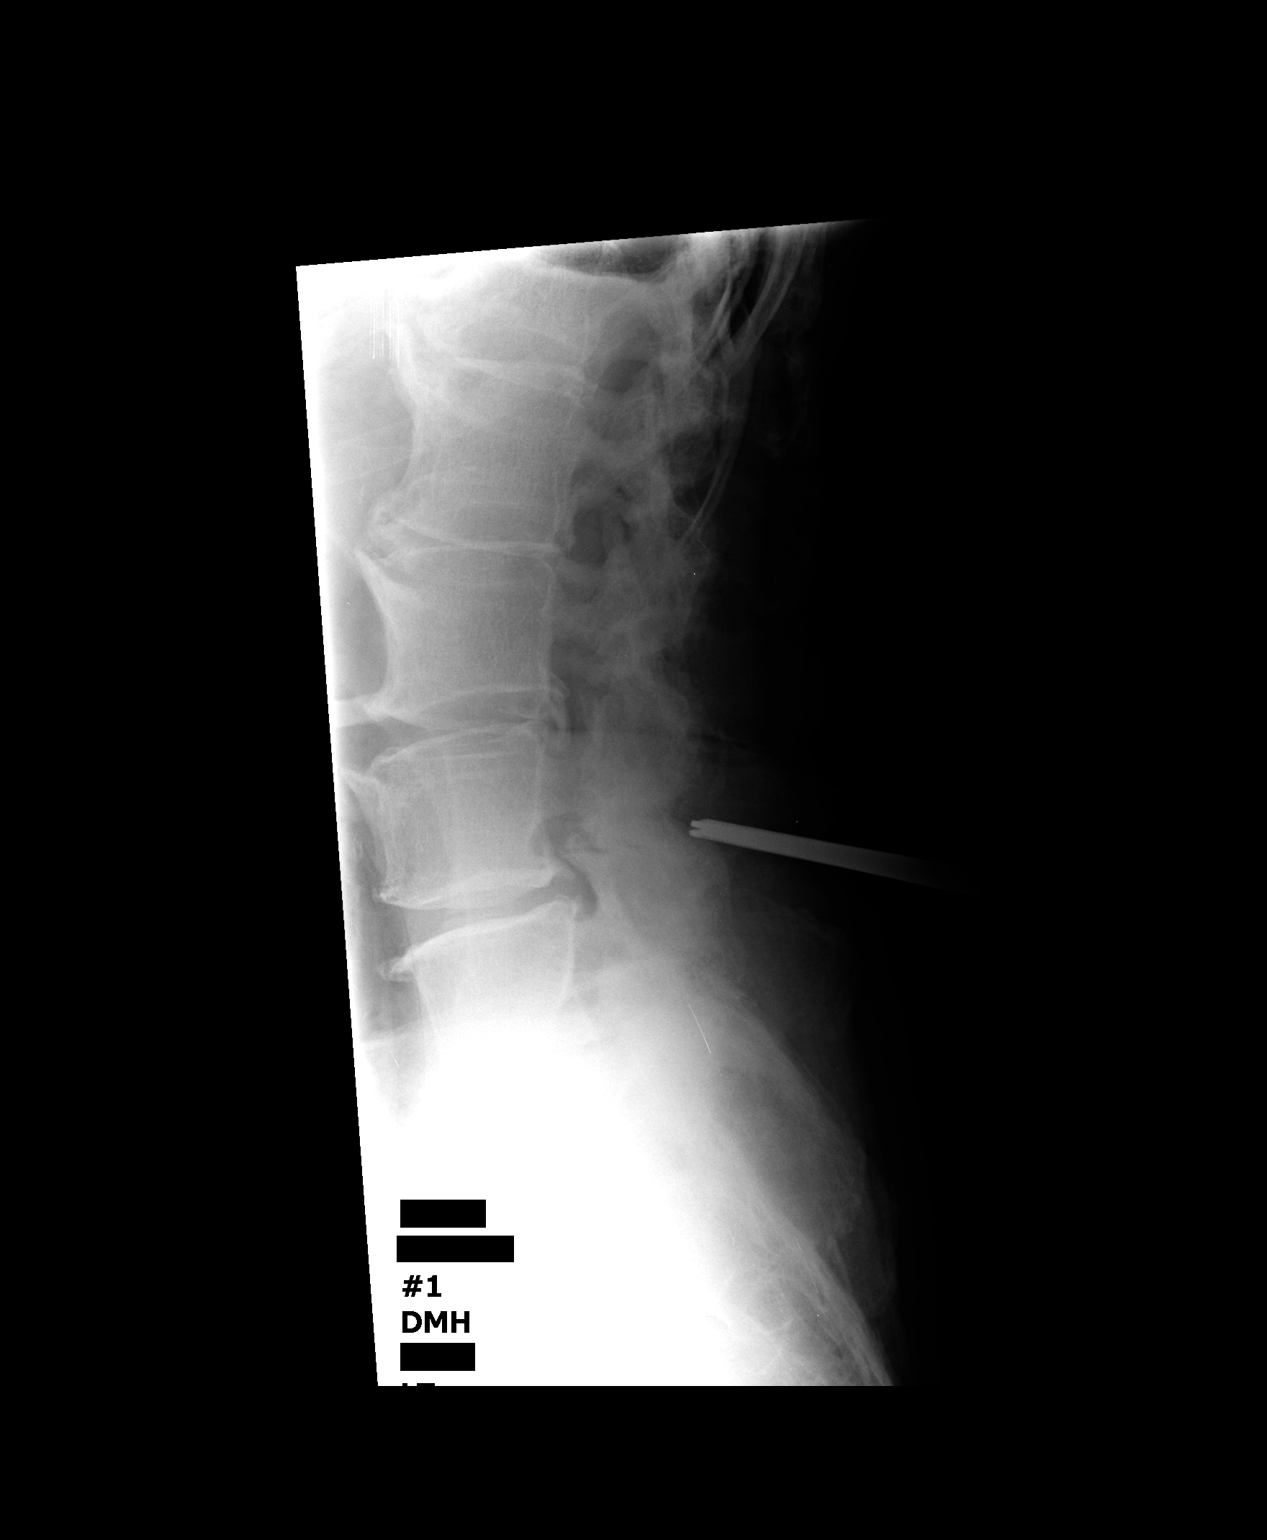

[1 of 1 positions shown; findings below may reference images not displayed]

FINDINGS: Single lateral intraoperative lumbar spine radiograph
shows surgical instrument projecting over the L4 spinous process.
IMPRESSION: Intraoperative localization.

## 2011-04-15 ENCOUNTER — Encounter: Payer: Self-pay | Admitting: Cardiovascular Disease

## 2011-04-15 ENCOUNTER — Ambulatory Visit (INDEPENDENT_AMBULATORY_CARE_PROVIDER_SITE_OTHER): Payer: Medicare Other | Admitting: Cardiovascular Disease

## 2011-04-15 DIAGNOSIS — I4891 Unspecified atrial fibrillation: Secondary | ICD-10-CM

## 2011-04-15 DIAGNOSIS — I10 Essential (primary) hypertension: Secondary | ICD-10-CM

## 2011-04-15 MED ORDER — SIMVASTATIN 20 MG PO TABS: 20.0000 mg | ORAL_TABLET | Freq: Every day | ORAL | Status: DC

## 2011-04-15 NOTE — Assessment & Plan Note (Signed)
She's not had any recurrent episodes of atrial fibrillation.

## 2011-04-15 NOTE — Assessment & Plan Note (Signed)
Her blood pressure remains a little bit elevated. We'll have her continue to watch her salt intake.

## 2011-04-15 NOTE — Patient Instructions (Signed)
Your physician wants you to follow-up in: 1 year  You will receive a reminder letter in the mail two months in advance. If you don't receive a letter, please call our office to schedule the follow-up appointment.   Your physician recommends that you return for a FASTING lipid profile: 1 year   

## 2011-04-15 NOTE — Progress Notes (Signed)
Janice [name] Date of Birth  September 15, 1931 [facility]     [place] [facility]  [address]    [address]   [address] [place], [place]  [address]    [place], [place]  [address] [phone]  Fax  [phone]  [phone]  Fax [phone]  List: 1. Atrial fibrillation 2. Diabetes mellitus  3. Hypertension 4. Hyperlipidemia 5. Chest pain-cardiac catheterization in April, 2011 revealed smooth and normal coronary arteries  History of Present Illness:  Janice [name] is a 78 yo with a history of intermittent atrial fibrillation. She has been in normal sinus rhythm for the past several years. She also has a history of diabetes mellitus, hypertension, and hyperlipidemia. She's had episodes of chest pain in the past but has normal coronary arteries by heart catheterization in 2011.  She has not been exercising as much as she would like.  She used to go to water aerobics but has not been in a while  Current Outpatient Prescriptions on File Prior to Visit  Medication Sig Dispense Refill  . aspirin 81 MG tablet Take 81 mg by mouth daily.        . Cyanocobalamin (B-12 PO) Take by mouth daily.        . Diclofenac Sodium (PENNSAID) 1.5 % SOLN Place onto the skin as needed.        . gabapentin (NEURONTIN) 100 MG tablet Take 100 mg by mouth 3 (three) times daily.        Marland Kitchen losartan-hydrochlorothiazide (HYZAAR) 100-25 MG per tablet Take 1 tablet by mouth daily.        . metFORMIN (GLUMETZA) 1000 MG (MOD) 24 hr tablet Take 1,000 mg by mouth 2 (two) times daily with a meal.       . methocarbamol (ROBAXIN) 500 MG tablet Take 500 mg by mouth as needed.        . metoprolol (LOPRESSOR) 50 MG tablet Take 25 mg by mouth 2 (two) times daily.       . Multiple Minerals (CALCIUM/MAGNESIUM/ZINC PO) Take by mouth daily.        . nitroGLYCERIN (NITROSTAT) 0.4 MG SL tablet Place 0.4 mg under the tongue every 5 (five) minutes as needed.        . ranitidine (ZANTAC) 300 MG tablet Take 300 mg by mouth as  needed.        . simvastatin (ZOCOR) 80 MG tablet Take 20 mg by mouth at bedtime.          Allergies  Allergen Reactions  . Meperidine Hcl     REACTION: nausea and vomiting    Past Medical History  Diagnosis Date  . Atrial fibrillation   . Diabetes mellitus   . Hypertension   . Hyperlipidemia   . Chest pain     Past Surgical History  Procedure Date  . Cardiac catheterization 06/2009    REVEALED SMOOTH AND NORMAL CORONARY ARTERIES  . Back surgery     History  Smoking status  . Never Smoker   Smokeless tobacco  . Not on file    History  Alcohol Use No    Family History  Problem Relation Age of Onset  . Colon cancer Mother   . Kidney cancer Father   . Lung cancer Sister     Reviw of Systems:  Reviewed in the HPI.  All other systems are negative.  Physical Exam: Blood pressure 155/79, pulse 75, height 5' 6.5" (1.689 m), weight 185 lb 9.6 oz (84.188  kg).  General: Well developed, well nourished, in no acute distress.  Head: Normocephalic, atraumatic, sclera non-icteric, mucus membranes are moist   Neck: Supple. Negative for carotid bruits. JVD not elevated.  Lungs: Clear bilaterally to auscultation without wheezes, rales, or rhonchi. Breathing is unlabored.  Heart: RRR with S1 S2. No murmurs, rubs, or gallops appreciated.  Abdomen: Soft, non-tender, non-distended with normoactive bowel sounds. No hepatomegaly. No rebound/guarding. No obvious abdominal masses.  Msk:  Strength and tone appear normal for age.  Extremities: No clubbing or cyanosis. No edema.  Distal pedal pulses are 2+ and equal bilaterally.  Neuro: Alert and oriented X 3. Moves all extremities spontaneously.  Psych:  Responds to questions appropriately with a normal affect.   ECG:  Assessment / Plan:

## 2012-07-21 ENCOUNTER — Telehealth: Payer: Self-pay | Admitting: Internal Medicine

## 2012-07-21 NOTE — Telephone Encounter (Signed)
Spoke with Jerene Dilling at Dr. Lucilla Lame office and scheduled patient on 07/24/12 at 9/9:15 AM with Doug Sou, PA.

## 2012-07-24 ENCOUNTER — Encounter: Payer: Self-pay | Admitting: Gastroenterology

## 2012-07-24 ENCOUNTER — Encounter: Payer: Self-pay | Admitting: Internal Medicine

## 2012-07-24 ENCOUNTER — Telehealth: Payer: Self-pay

## 2012-07-24 ENCOUNTER — Ambulatory Visit (INDEPENDENT_AMBULATORY_CARE_PROVIDER_SITE_OTHER): Payer: Medicare Other | Admitting: Gastroenterology

## 2012-07-24 VITALS — Ht 66.0 in | Wt 164.4 lb

## 2012-07-24 DIAGNOSIS — R1319 Other dysphagia: Secondary | ICD-10-CM

## 2012-07-24 NOTE — Progress Notes (Signed)
07/24/2012 Janice [name] [id number] 12-30-1931   HISTORY OF PRESENT ILLNESS:  Patient is a pleasant 79 year old female who is a patient of Dr. Regino Schultze.  She states that for at least the past year she has been experiencing issues with swallowing solid food intermittently.  She says that it gets stuck and will not go down for at least several minutes.  It then causes pain in her chest and into her neck.  She was even taken to the ED on 2 or 3 occasions for cardiac evaluation.  It was discovered at one point that she has atrial fibrillation, and she was placed on coumadin.  She has learned to make sure she chews her food well, but it still occurs.  Food eventually goes down.  She has lost 10 pounds over the past year.  She denies nausea, vomiting, abdominal pain.  Says that her appetite is ok except for when the dysphagia occurs then she no longer wants to eat that meal.  She was taking zantac at bedtime as needed, but last week her PCP suggested that she begin taking it daily, which she has done.    Past Medical History  Diagnosis Date  . Atrial fibrillation   . Diabetes mellitus   . Hypertension   . Hyperlipidemia   . Chest pain   . Allergic rhinitis   . Obesity   . Osteoarthritis   . Cataracts, bilateral   . Endometrial cancer   . Atrial fibrillation   . GERD (gastroesophageal reflux disease)   . CKD (chronic kidney disease)   . Lymphedema    Past Surgical History  Procedure Laterality Date  . Cardiac catheterization  06/2009    REVEALED SMOOTH AND NORMAL CORONARY ARTERIES  . Back surgery    . Cholecystectomy    . Vaginal hysterectomy      reports that she has never smoked. She has never used smokeless tobacco. She reports that she does not drink alcohol or use illicit drugs. family history includes Colon cancer in her mother and sister; Kidney cancer in her father; and Lung cancer in her sister. Allergies  Allergen Reactions  . Demerol (Meperidine) Nausea Only    And vertigo   . Meperidine Hcl     REACTION: nausea and vomiting      Outpatient Encounter Prescriptions as of 07/24/2012  Medication Sig Dispense Refill  . aspirin 81 MG tablet Take 81 mg by mouth daily.        . Cyanocobalamin (B-12 PO) Take by mouth daily.        . Diclofenac Sodium (PENNSAID) 1.5 % SOLN Place onto the skin as needed.        . gabapentin (NEURONTIN) 100 MG tablet Take 100 mg by mouth 3 (three) times daily.        Marland Kitchen losartan-hydrochlorothiazide (HYZAAR) 100-25 MG per tablet Take 1 tablet by mouth daily.        . metFORMIN (GLUMETZA) 1000 MG (MOD) 24 hr tablet Take 1,000 mg by mouth 2 (two) times daily with a meal.       . methocarbamol (ROBAXIN) 500 MG tablet Take 500 mg by mouth as needed.        . metoprolol (LOPRESSOR) 50 MG tablet Take 25 mg by mouth 2 (two) times daily.       . Multiple Minerals (CALCIUM/MAGNESIUM/ZINC PO) Take by mouth daily.        . nitrofurantoin, macrocrystal-monohydrate, (MACROBID) 100 MG capsule       .  nitroGLYCERIN (NITROSTAT) 0.4 MG SL tablet Place 0.4 mg under the tongue every 5 (five) minutes as needed.        . polyethylene glycol (MIRALAX / GLYCOLAX) packet Take by mouth as needed.      . ranitidine (ZANTAC) 300 MG tablet Take 300 mg by mouth as needed.        . simvastatin (ZOCOR) 20 MG tablet Take 1 tablet (20 mg total) by mouth at bedtime.  90 tablet  3  . warfarin (COUMADIN) 2.5 MG tablet Take 2.5 mg by mouth 2 (two) times daily.       No facility-administered encounter medications on file as of 07/24/2012.     REVIEW OF SYSTEMS  : All other systems reviewed and negative except where noted in the History of Present Illness.   PHYSICAL EXAM: Ht 5\' 6"  (1.676 m)  Wt 164 lb 6 oz (74.56 kg)  BMI 26.54 kg/m2 General: Well developed white female in no acute distress Head: Normocephalic and atraumatic Eyes:  sclerae anicteric,conjunctive pink. Ears: Normal auditory acuity Lungs: Clear throughout to auscultation Heart: Regular rate and  rhythm Abdomen: Soft, non-tender, non-distended. No masses or hepatomegaly noted. Normal bowel sounds. Musculoskeletal: Symmetrical with no gross deformities  Skin: No lesions on visible extremities Extremities: No edema  Neurological: Alert oriented x 4, grossly nonfocal Psychological:  Alert and cooperative. Normal mood and affect  ASSESSMENT AND PLAN: -Solid food dysphagia:  Patient with food getting stuck intermittently for at least the past year.  *EGD with possible dilation with Dr. Juanda Chance.

## 2012-07-24 NOTE — Telephone Encounter (Signed)
Will fax new clearance form to Dr. Cliffton Asters for instructions on coumadin clearance.

## 2012-07-24 NOTE — Telephone Encounter (Signed)
Pt not seen in 1.5 yrs, Dr Laurann Montana is managing her coumadin. Can you please ask her for coumadin instructions. I will forward to CMA Lewellyn.

## 2012-07-24 NOTE — Patient Instructions (Addendum)
You have been scheduled for an endoscopy with propofol. Please follow written instructions given to you at your visit today. If you use inhalers (even only as needed), please bring them with you on the day of your procedure. Your physician has requested that you go to www.startemmi.com and enter the access code given to you at your visit today. This web site gives a general overview about your procedure. However, you should still follow specific instructions given to you by our office regarding your preparation for the procedure.  You will be contaced by our office prior to your procedure for directions on holding your Coumadin/Warfarin.  If you do not hear from our office 1 week prior to your scheduled procedure, please call [phone] to discuss.  cc: Laurann Montana, MD

## 2012-07-24 NOTE — Telephone Encounter (Signed)
  07/24/2012    RE: Janice [name] DOB: March 26, 1931 MRN: [id number]   Dear Dr. Elease Hashimoto,    We have scheduled the above patient for an endoscopic procedure. Our records show that she is on anticoagulation therapy.   Please advise as to how long the patient may come off her therapy of coumadin prior to the procedure, which is scheduled for 08/11/12.  Please fax back/ or route the completed form to Christie Nottingham at [phone].   Sincerely,  Christie Nottingham, CMA  Lina Sar, MD

## 2012-07-25 NOTE — Progress Notes (Signed)
reviewded and agree with EGD/dil

## 2012-07-25 NOTE — Telephone Encounter (Signed)
Received a fax from Dr. Lucilla Lame office stating that Dr. Elease Hashimoto prescribed the coumadin for atrial fibrillation and our office to check with him about how long patient can stop coumadin before Endoscopy. Called Dr. Lucilla Lame office back and spoke with Annabelle Harman (Dr. Lucilla Lame nurse) and asked if she would send a note to Dr. Cliffton Asters asking her again if she would answer the anticoagulant clearance since Dr. Elease Hashimoto states she has been the one managing it for over a year. Annabelle Harman states she will call me back with a answer after reviewing this with Dr. Cliffton Asters.

## 2012-07-26 ENCOUNTER — Telehealth: Payer: Self-pay | Admitting: Internal Medicine

## 2012-07-26 NOTE — Telephone Encounter (Signed)
Per Annabelle Harman at Dr. Lucilla Lame office patient can come off coumadin 5 days prior to Endoscopy per Dr. Lucilla Lame orders. Notified patient of Dr. Lucilla Lame orders. Pt agreed and verbalized understanding.

## 2012-07-26 NOTE — Telephone Encounter (Signed)
Told her I called her cell phone and got her already about her coumadin clearance. Pt verbalized understanding.

## 2012-08-11 ENCOUNTER — Encounter: Payer: Self-pay | Admitting: Internal Medicine

## 2012-08-11 ENCOUNTER — Ambulatory Visit (AMBULATORY_SURGERY_CENTER): Payer: Medicare Other | Admitting: Internal Medicine

## 2012-08-11 ENCOUNTER — Other Ambulatory Visit: Payer: Self-pay | Admitting: Internal Medicine

## 2012-08-11 VITALS — BP 147/67 | HR 65 | Temp 96.3°F | Resp 14 | Ht 66.0 in | Wt 164.0 lb

## 2012-08-11 DIAGNOSIS — K2960 Other gastritis without bleeding: Secondary | ICD-10-CM

## 2012-08-11 DIAGNOSIS — R1310 Dysphagia, unspecified: Secondary | ICD-10-CM

## 2012-08-11 DIAGNOSIS — K222 Esophageal obstruction: Secondary | ICD-10-CM

## 2012-08-11 LAB — GLUCOSE, CAPILLARY: Glucose-Capillary: 102 mg/dL — ABNORMAL HIGH (ref 70–99)

## 2012-08-11 MED ORDER — SODIUM CHLORIDE 0.9 % IV SOLN: 500.0000 mL | INTRAVENOUS | Status: DC

## 2012-08-11 NOTE — Progress Notes (Signed)
Patient did not experience any of the following events: a burn prior to discharge; a fall within the facility; wrong site/side/patient/procedure/implant event; or a hospital transfer or hospital admission upon discharge from the facility. (G8907) Patient did not have preoperative order for IV antibiotic SSI prophylaxis. (G8918)  

## 2012-08-11 NOTE — Patient Instructions (Addendum)
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  YOU HAD AN ENDOSCOPIC PROCEDURE TODAY AT THE [facility]: Refer to the procedure report that was given to you for any specific questions about what was found during the examination.  If the procedure report does not answer your questions, please call your gastroenterologist to clarify.  If you requested that your care partner not be given the details of your procedure findings, then the procedure report has been included in a sealed envelope for you to review at your convenience later.  YOU SHOULD EXPECT: Some feelings of bloating in the abdomen. Passage of more gas than usual.  Walking can help get rid of the air that was put into your GI tract during the procedure and reduce the bloating. If you had a lower endoscopy (such as a colonoscopy or flexible sigmoidoscopy) you may notice spotting of blood in your stool or on the toilet paper. If you underwent a bowel prep for your procedure, then you may not have a normal bowel movement for a few days.  DIET: Your first meal following the procedure should be a light meal and then it is ok to progress to your normal diet.  A half-sandwich or bowl of soup is an example of a good first meal.  Heavy or fried foods are harder to digest and may make you feel nauseous or bloated.  Likewise meals heavy in dairy and vegetables can cause extra gas to form and this can also increase the bloating.  Drink plenty of fluids but you should avoid alcoholic beverages for 24 hours.  ACTIVITY: Your care partner should take you home directly after the procedure.  You should plan to take it easy, moving slowly for the rest of the day.  You can resume normal activity the day after the procedure however you should NOT DRIVE or use heavy machinery for 24 hours  (because of the sedation medicines used during the test).    SYMPTOMS TO REPORT IMMEDIATELY: A gastroenterologist can be reached at any hour.  During normal business hours, 8:30 AM to 5:00 PM Monday through Friday, call [phone].  After hours and on weekends, please call the GI answering service at [phone] who will take a message and have the physician on call contact you.   Following upper endoscopy (EGD)  Vomiting of blood or coffee ground material  New chest pain or pain under the shoulder blades  Painful or persistently difficult swallowing  New shortness of breath  Fever of 100F or higher  Black, tarry-looking stools  FOLLOW UP: If any biopsies were taken you will be contacted by phone or by letter within the next 1-3 weeks.  Call your gastroenterologist if you have not heard about the biopsies in 3 weeks.  Our staff will call the home number listed on your records the next business day following your procedure to check on you and address any questions or concerns that you may have at that time regarding the information given to you following your procedure. This is a courtesy call and so if there is no answer at the home number and we have not heard from you through the emergency physician on call, we will assume that you have returned to your regular daily activities without incident.  SIGNATURES/CONFIDENTIALITY: You and/or your care partner have signed paperwork which will be entered into your electronic medical record.  These signatures attest to the fact that that the information above on your After Visit Summary has been reviewed and is understood.  Full responsibility of the confidentiality of this discharge information lies with you and/or your care-partner.

## 2012-08-11 NOTE — Op Note (Signed)
[facility] [address] [place], [address]   ENDOSCOPY PROCEDURE REPORT  PATIENT: Janice, [name]  MR#: [id number] BIRTHDATE: [birth year] , 80  yrs. old GENDER: Female ENDOSCOPIST: Hart Carwin, MD REFERRED BY:  Laurann Montana, M.D. PROCEDURE DATE:  08/11/2012 PROCEDURE:  EGD w/ biopsy and Savary dilation of esophagus ASA CLASS:     Class II INDICATIONS:  Dysphagia. MEDICATIONS: MAC sedation, administered by CRNA and propofol (Diprivan) 150mg  IV TOPICAL ANESTHETIC: Cetacaine Spray  DESCRIPTION OF PROCEDURE: After the risks benefits and alternatives of the procedure were thoroughly explained, informed consent was obtained.  The LB [id number] endoscope was introduced through the mouth and advanced to the second portion of the duodenum. Without limitations.  The instrument was slowly withdrawn as the mucosa was fully examined.      esophagus: Esophageal mucosa appeared normal throughout the proximal mid and distal esophagus. At the level of 20 cm was a pulsating aortic impression which bulged into the lumen. Esophagus was slightly tortuous and there was a mild nonobstructing stricture at the GE junction which appeared benign. It was traversed with the endoscope without resistance. There was no significant hiatal hernia,guidewire was placed into the stomach and a Savary dilator passed into the stomach without resistance. 14 and 15 mm dilators were used and was small amount of blood on the dilator Stomach: Gastric mucosa appeared atrophic. There was paucity of flow rugal folds. Multiple biopsies were obtained to rule out atrophic gastritis. Retroflexion of the endoscope revealed normal fundus and cardia. Gastric outlet was normal Duodenum duodenal bulb and descending duodenum were normal[ The scope was then withdrawn from the patient and the procedure completed.  COMPLICATIONS: There were no complications. 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 Status post passage of 14 and 15 mm dilators Diffuse gastritis rule out atrophic gastritis. Biopsies taken RECOMMENDATIONS: await results of the biopsies Await results of the dilatation. If dysphagia persists barium esophagram may be useful to assess the motility as well as the degree of obstruction by aortic arch REPEAT EXAM: for EGD pending biopsy results.  eSigned:  Hart Carwin, MD 08/11/2012 9:44 AM   CC:  PATIENT NAME:  Janice, [name] MR#: [id number]

## 2012-08-11 NOTE — Progress Notes (Signed)
Procedure ends, to recovery, report given and VSS. 

## 2012-08-14 ENCOUNTER — Telehealth: Payer: Self-pay

## 2012-08-14 NOTE — Telephone Encounter (Signed)
  Follow up Call-  Call back number 08/11/2012  Post procedure Call Back phone  # [phone]  Permission to leave phone message Yes     Patient questions:  Do you have a fever, pain , or abdominal swelling? no Pain Score  0 *  Have you tolerated food without any problems? yes  Have you been able to return to your normal activities? yes  Do you have any questions about your discharge instructions: Diet   no Medications  no Follow up visit  no  Do you have questions or concerns about your Care? no  Actions: * If pain score is 4 or above: No action needed, pain <4.

## 2012-08-15 ENCOUNTER — Encounter: Payer: Self-pay | Admitting: Internal Medicine

## 2012-09-18 ENCOUNTER — Other Ambulatory Visit (HOSPITAL_COMMUNITY): Payer: Self-pay | Admitting: Specialist

## 2012-09-18 ENCOUNTER — Other Ambulatory Visit: Payer: Self-pay | Admitting: Specialist

## 2012-09-18 ENCOUNTER — Ambulatory Visit (HOSPITAL_COMMUNITY)
Admission: RE | Admit: 2012-09-18 | Discharge: 2012-09-18 | Disposition: A | Discharge status: Home / Self Care | Payer: Medicare Other | Source: Ambulatory Visit | Attending: Specialist | Admitting: Specialist

## 2012-09-18 DIAGNOSIS — M545 Low back pain, unspecified: Secondary | ICD-10-CM | POA: Insufficient documentation

## 2012-09-18 DIAGNOSIS — M4804 Spinal stenosis, thoracic region: Secondary | ICD-10-CM | POA: Insufficient documentation

## 2012-09-18 DIAGNOSIS — Z8542 Personal history of malignant neoplasm of other parts of uterus: Secondary | ICD-10-CM | POA: Insufficient documentation

## 2012-09-18 DIAGNOSIS — M5380 Other specified dorsopathies, site unspecified: Secondary | ICD-10-CM | POA: Insufficient documentation

## 2012-09-18 DIAGNOSIS — R209 Unspecified disturbances of skin sensation: Secondary | ICD-10-CM | POA: Insufficient documentation

## 2012-09-18 LAB — CREATININE, SERUM
Creatinine, Ser: 1.04 mg/dL (ref 0.50–1.10)
GFR calc non Af Amer: 50 mL/min — ABNORMAL LOW (ref >90)

## 2012-09-18 MED ORDER — GADOBENATE DIMEGLUMINE 529 MG/ML IV SOLN: 15.0000 mL | Freq: Once | INTRAVENOUS | Status: DC

## 2012-09-18 MED ORDER — GADOBENATE DIMEGLUMINE 529 MG/ML IV SOLN: 14.0000 mL | Freq: Once | INTRAVENOUS | Status: AC | PRN

## 2012-09-21 ENCOUNTER — Other Ambulatory Visit: Payer: Medicare Other

## 2012-09-27 ENCOUNTER — Other Ambulatory Visit: Payer: Self-pay | Admitting: Family Medicine

## 2012-09-27 DIAGNOSIS — R5383 Other fatigue: Secondary | ICD-10-CM

## 2012-09-27 DIAGNOSIS — R634 Abnormal weight loss: Secondary | ICD-10-CM

## 2012-09-29 ENCOUNTER — Ambulatory Visit
Admission: RE | Admit: 2012-09-29 | Discharge: 2012-09-29 | Disposition: A | Discharge status: Home / Self Care | Payer: Medicare Other | Source: Ambulatory Visit | Attending: Family Medicine | Admitting: Family Medicine

## 2012-09-29 DIAGNOSIS — R5383 Other fatigue: Secondary | ICD-10-CM

## 2012-09-29 DIAGNOSIS — R634 Abnormal weight loss: Secondary | ICD-10-CM

## 2012-09-29 MED ORDER — IOHEXOL 300 MG/ML  SOLN
100.0000 mL | Freq: Once | INTRAMUSCULAR | Status: AC | PRN
  Administered 2012-09-29: 100 mL via INTRAVENOUS

## 2012-10-12 ENCOUNTER — Other Ambulatory Visit: Payer: Self-pay | Admitting: Family Medicine

## 2012-10-12 DIAGNOSIS — R319 Hematuria, unspecified: Secondary | ICD-10-CM

## 2012-10-13 ENCOUNTER — Ambulatory Visit
Admission: RE | Admit: 2012-10-13 | Discharge: 2012-10-13 | Disposition: A | Discharge status: Home / Self Care | Payer: Medicare Other | Source: Ambulatory Visit | Attending: Family Medicine | Admitting: Family Medicine

## 2012-10-13 DIAGNOSIS — R319 Hematuria, unspecified: Secondary | ICD-10-CM

## 2013-04-16 ENCOUNTER — Other Ambulatory Visit: Payer: Self-pay | Admitting: Family Medicine

## 2013-04-16 ENCOUNTER — Ambulatory Visit
Admission: RE | Admit: 2013-04-16 | Discharge: 2013-04-16 | Disposition: A | Discharge status: Home / Self Care | Payer: Medicare Other | Source: Ambulatory Visit | Attending: Family Medicine | Admitting: Family Medicine

## 2013-04-16 DIAGNOSIS — M545 Low back pain, unspecified: Secondary | ICD-10-CM

## 2013-06-22 ENCOUNTER — Observation Stay (HOSPITAL_COMMUNITY)
Admission: EM | Admit: 2013-06-22 | Discharge: 2013-06-23 | Disposition: A | Discharge status: Home / Self Care | Payer: Medicare Other | Attending: Internal Medicine | Admitting: Internal Medicine

## 2013-06-22 ENCOUNTER — Emergency Department (HOSPITAL_COMMUNITY): Payer: Medicare Other

## 2013-06-22 ENCOUNTER — Encounter (HOSPITAL_COMMUNITY): Payer: Self-pay | Admitting: Emergency Medicine

## 2013-06-22 DIAGNOSIS — R079 Chest pain, unspecified: Secondary | ICD-10-CM | POA: Diagnosis present

## 2013-06-22 DIAGNOSIS — E119 Type 2 diabetes mellitus without complications: Secondary | ICD-10-CM | POA: Insufficient documentation

## 2013-06-22 DIAGNOSIS — N289 Disorder of kidney and ureter, unspecified: Secondary | ICD-10-CM | POA: Diagnosis present

## 2013-06-22 DIAGNOSIS — R0789 Other chest pain: Principal | ICD-10-CM | POA: Insufficient documentation

## 2013-06-22 DIAGNOSIS — E785 Hyperlipidemia, unspecified: Secondary | ICD-10-CM

## 2013-06-22 DIAGNOSIS — Z9889 Other specified postprocedural states: Secondary | ICD-10-CM | POA: Insufficient documentation

## 2013-06-22 DIAGNOSIS — I10 Essential (primary) hypertension: Secondary | ICD-10-CM

## 2013-06-22 DIAGNOSIS — I4891 Unspecified atrial fibrillation: Secondary | ICD-10-CM | POA: Diagnosis present

## 2013-06-22 DIAGNOSIS — N189 Chronic kidney disease, unspecified: Secondary | ICD-10-CM | POA: Insufficient documentation

## 2013-06-22 DIAGNOSIS — K219 Gastro-esophageal reflux disease without esophagitis: Secondary | ICD-10-CM | POA: Insufficient documentation

## 2013-06-22 DIAGNOSIS — Z7901 Long term (current) use of anticoagulants: Secondary | ICD-10-CM | POA: Insufficient documentation

## 2013-06-22 DIAGNOSIS — Z7982 Long term (current) use of aspirin: Secondary | ICD-10-CM | POA: Insufficient documentation

## 2013-06-22 DIAGNOSIS — M79609 Pain in unspecified limb: Secondary | ICD-10-CM | POA: Insufficient documentation

## 2013-06-22 DIAGNOSIS — T45515A Adverse effect of anticoagulants, initial encounter: Secondary | ICD-10-CM

## 2013-06-22 DIAGNOSIS — Z79899 Other long term (current) drug therapy: Secondary | ICD-10-CM | POA: Insufficient documentation

## 2013-06-22 DIAGNOSIS — I129 Hypertensive chronic kidney disease with stage 1 through stage 4 chronic kidney disease, or unspecified chronic kidney disease: Secondary | ICD-10-CM | POA: Insufficient documentation

## 2013-06-22 DIAGNOSIS — Z8541 Personal history of malignant neoplasm of cervix uteri: Secondary | ICD-10-CM | POA: Insufficient documentation

## 2013-06-22 DIAGNOSIS — D6832 Hemorrhagic disorder due to extrinsic circulating anticoagulants: Secondary | ICD-10-CM | POA: Diagnosis present

## 2013-06-22 DIAGNOSIS — E669 Obesity, unspecified: Secondary | ICD-10-CM | POA: Insufficient documentation

## 2013-06-22 DIAGNOSIS — M1990 Unspecified osteoarthritis, unspecified site: Secondary | ICD-10-CM | POA: Insufficient documentation

## 2013-06-22 DIAGNOSIS — H269 Unspecified cataract: Secondary | ICD-10-CM | POA: Insufficient documentation

## 2013-06-22 DIAGNOSIS — I890 Lymphedema, not elsewhere classified: Secondary | ICD-10-CM | POA: Insufficient documentation

## 2013-06-22 LAB — BASIC METABOLIC PANEL
BUN: 27 mg/dL — ABNORMAL HIGH (ref 6–23)
CALCIUM: 9.3 mg/dL (ref 8.4–10.5)
CO2: 24 meq/L (ref 19–32)
Chloride: 105 mEq/L (ref 96–112)
Creatinine, Ser: 1.19 mg/dL — ABNORMAL HIGH (ref 0.50–1.10)
GFR calc Af Amer: 49 mL/min — ABNORMAL LOW (ref >90)
GFR, EST NON AFRICAN AMERICAN: 42 mL/min — AB (ref >90)
GLUCOSE: 95 mg/dL (ref 70–99)
POTASSIUM: 3.9 meq/L (ref 3.7–5.3)
SODIUM: 144 meq/L (ref 137–147)

## 2013-06-22 LAB — CBC
HEMATOCRIT: 34.1 % — AB (ref 36.0–46.0)
HEMOGLOBIN: 11.4 g/dL — AB (ref 12.0–15.0)
MCH: 29.6 pg (ref 26.0–34.0)
MCHC: 33.4 g/dL (ref 30.0–36.0)
MCV: 88.6 fL (ref 78.0–100.0)
Platelets: 150 10*3/uL (ref 150–400)
RBC: 3.85 MIL/uL — AB (ref 3.87–5.11)
RDW: 13.8 % (ref 11.5–15.5)
WBC: 8.7 10*3/uL (ref 4.0–10.5)

## 2013-06-22 LAB — I-STAT TROPONIN, ED: Troponin i, poc: 0.01 ng/mL (ref 0.00–0.08)

## 2013-06-22 LAB — CBG MONITORING, ED: Glucose-Capillary: 107 mg/dL — ABNORMAL HIGH (ref 70–99)

## 2013-06-22 MED ORDER — ASPIRIN 81 MG PO CHEW
324.0000 mg | CHEWABLE_TABLET | Freq: Once | ORAL | Status: DC
  Filled 2013-06-22: qty 4

## 2013-06-22 NOTE — ED Notes (Signed)
Jenkins, MD at bedside.  

## 2013-06-22 NOTE — ED Notes (Signed)
Pt is from home, BIB [facility]. Pt reporting chest pressure and left sided arm pain that started "about an hour ago". EMS states that the pt was rating pain as a 5/10, but denies pain at this time. Pt received 2 NTG via EMS with some relief, as well as 4 mg of morphine with relief of the pain in her left arm. Pt also took 324 ASA prior to EMS arrival. EMS also reports bilateral ankle swelling. Pt also c/o memory impairment x "past couple of weeks" and is concerned that her medication is affecting her memory. CBG is 122 for EMS.

## 2013-06-22 NOTE — ED Provider Notes (Signed)
CSN: [id number]     Arrival date & time 06/22/13  2100 History   First MD Initiated Contact with Patient 06/22/13 2146     Chief Complaint  Patient presents with  . Chest Pain  . Arm Pain     (Consider location/radiation/quality/duration/timing/severity/associated sxs/prior Treatment) The history is provided by the patient.   history of present illness: 80 year-old female with history of atrial fibrillation, diabetes, hypertension, hyperlipidemia who presents with episode of chest pain that started a few hours prior to arrival. Pain described as pressure and is located in the center of the chest with radiation to the left arm, left side of the neck, and left side of the head. She denies dyspnea. Chest pain occurred while at rest. She was given aspirin and nitroglycerin via EMS prior to arrival.  Past Medical History  Diagnosis Date  . Atrial fibrillation   . Diabetes mellitus   . Hypertension   . Hyperlipidemia   . Chest pain   . Allergic rhinitis   . Obesity   . Osteoarthritis   . Cataracts, bilateral   . Endometrial cancer   . Atrial fibrillation   . GERD (gastroesophageal reflux disease)   . CKD (chronic kidney disease)   . Lymphedema    Past Surgical History  Procedure Laterality Date  . Cardiac catheterization  06/2009    REVEALED SMOOTH AND NORMAL CORONARY ARTERIES  . Back surgery    . Cholecystectomy    . Vaginal hysterectomy    . Appendectomy     Family History  Problem Relation Age of Onset  . Colon cancer Mother   . Kidney cancer Father   . Lung cancer Sister   . Colon cancer Sister     x2   History  Substance Use Topics  . Smoking status: Never Smoker   . Smokeless tobacco: Never Used  . Alcohol Use: No   OB History   Grav Para Term Preterm Abortions TAB SAB Ect Mult Living                 Review of Systems  Constitutional: Negative for fever and chills.  HENT: Negative for congestion.   Eyes: Negative for pain.  Respiratory: Negative for  shortness of breath.   Cardiovascular: Positive for chest pain.  Gastrointestinal: Negative for nausea, vomiting, abdominal pain, diarrhea and constipation.  Genitourinary: Negative for dysuria.  Musculoskeletal: Negative for back pain.  Skin: Negative for rash and wound.  Neurological: Negative for headaches.  All other systems reviewed and are negative.     Allergies  Demerol and Meperidine hcl  Home Medications   Current Outpatient Rx  Name  Route  Sig  Dispense  Refill  . aspirin 81 MG tablet   Oral   Take 81 mg by mouth daily.           Marland Kitchen HYDROcodone-acetaminophen (NORCO/VICODIN) 5-325 MG per tablet   Oral   Take 0.5 tablets by mouth every 6 (six) hours as needed for moderate pain.         Marland Kitchen irbesartan (AVAPRO) 300 MG tablet   Oral   Take 300 mg by mouth every evening.          . metFORMIN (GLUMETZA) 1000 MG (MOD) 24 hr tablet   Oral   Take 500 mg by mouth 2 (two) times daily with a meal.          . metoprolol (LOPRESSOR) 50 MG tablet   Oral   Take 50 mg by  mouth 2 (two) times daily.          . Multiple Minerals (CALCIUM/MAGNESIUM/ZINC PO)   Oral   Take 1 tablet by mouth daily as needed (for vitamin).          . nitroGLYCERIN (NITROSTAT) 0.4 MG SL tablet   Sublingual   Place 0.4 mg under the tongue every 5 (five) minutes as needed.           . polyethylene glycol (MIRALAX / GLYCOLAX) packet   Oral   Take 17 g by mouth daily as needed for mild constipation.          . ranitidine (ZANTAC) 300 MG tablet   Oral   Take 300 mg by mouth as needed for heartburn.          . simvastatin (ZOCOR) 40 MG tablet   Oral   Take 20 mg by mouth daily at 6 PM.         . warfarin (COUMADIN) 2.5 MG tablet   Oral   Take 2.5 mg by mouth See admin instructions. Take 1 tablet (2.5mg ) by mouth daily except take 1/2 tablet (1.25mg ) by mouth on Monday and thursday          BP 140/55  Temp(Src) 97.9 F (36.6 C) (Oral)  Resp 18  Ht 5\' 6"  (1.676 m)  Wt  165 lb (74.844 kg)  BMI 26.64 kg/m2  SpO2 98% Physical Exam  Nursing note and vitals reviewed. Constitutional: She is oriented to person, place, and time. She appears well-developed and well-nourished. No distress.  HENT:  Head: Normocephalic and atraumatic.  Eyes: Conjunctivae are normal.  Neck: Neck supple.  Cardiovascular: Normal rate, regular rhythm, normal heart sounds and intact distal pulses.   Pulmonary/Chest: Effort normal and breath sounds normal. She has no wheezes. She has no rales.  Abdominal: Soft. She exhibits no distension. There is no tenderness.  Musculoskeletal: Normal range of motion.  Neurological: She is alert and oriented to person, place, and time.  Skin: Skin is warm and dry.    ED Course  Procedures (including critical care time) Labs Review Labs Reviewed - No data to display Imaging Review No results found.   EKG Interpretation None      MDM   Final diagnoses:  None    80 year old female with history of diabetes, hypertension, hyperlipidemia who presents with substernal chest pressure radiating to the left side of the head and left arm that started a few hours prior to arrival. Patient received aspirin and nitroglycerin in route with some improvement in pain. Vital signs stable.  Presentation concerning for ACS. Have low suspicion for PE. EKG with atrial fibrillation.  Initial troponin negative. Hospitalist consulted and will admit for further evaluation of chest pain.   Date: 06/23/2013  Rate: 63  Rhythm: atrial fibrillation  QRS Axis: normal  Intervals: normal  ST/T Wave abnormalities: normal  Conduction Disutrbances:none  Narrative Interpretation: Atrial fibrillation  Old EKG Reviewed: unchanged    Renaldo Reel, MD 06/23/13 0157

## 2013-06-23 DIAGNOSIS — I4891 Unspecified atrial fibrillation: Secondary | ICD-10-CM

## 2013-06-23 DIAGNOSIS — T45515A Adverse effect of anticoagulants, initial encounter: Secondary | ICD-10-CM

## 2013-06-23 DIAGNOSIS — R079 Chest pain, unspecified: Secondary | ICD-10-CM

## 2013-06-23 DIAGNOSIS — D6832 Hemorrhagic disorder due to extrinsic circulating anticoagulants: Secondary | ICD-10-CM | POA: Diagnosis present

## 2013-06-23 DIAGNOSIS — E785 Hyperlipidemia, unspecified: Secondary | ICD-10-CM

## 2013-06-23 DIAGNOSIS — E119 Type 2 diabetes mellitus without complications: Secondary | ICD-10-CM

## 2013-06-23 DIAGNOSIS — N289 Disorder of kidney and ureter, unspecified: Secondary | ICD-10-CM | POA: Diagnosis present

## 2013-06-23 DIAGNOSIS — I10 Essential (primary) hypertension: Secondary | ICD-10-CM

## 2013-06-23 LAB — TROPONIN I: Troponin I: <0.3 ng/mL (ref <0.30)

## 2013-06-23 LAB — GLUCOSE, CAPILLARY
GLUCOSE-CAPILLARY: 112 mg/dL — AB (ref 70–99)
GLUCOSE-CAPILLARY: 84 mg/dL (ref 70–99)
Glucose-Capillary: 109 mg/dL — ABNORMAL HIGH (ref 70–99)

## 2013-06-23 LAB — BASIC METABOLIC PANEL
BUN: 29 mg/dL — ABNORMAL HIGH (ref 6–23)
CO2: 25 mEq/L (ref 19–32)
CREATININE: 1.2 mg/dL — AB (ref 0.50–1.10)
Calcium: 9.2 mg/dL (ref 8.4–10.5)
Chloride: 104 mEq/L (ref 96–112)
GFR calc non Af Amer: 42 mL/min — ABNORMAL LOW (ref >90)
GFR, EST AFRICAN AMERICAN: 48 mL/min — AB (ref >90)
Glucose, Bld: 111 mg/dL — ABNORMAL HIGH (ref 70–99)
Potassium: 3.7 mEq/L (ref 3.7–5.3)
Sodium: 142 mEq/L (ref 137–147)

## 2013-06-23 LAB — CBC
HCT: 33.8 % — ABNORMAL LOW (ref 36.0–46.0)
Hemoglobin: 11.1 g/dL — ABNORMAL LOW (ref 12.0–15.0)
MCH: 29.2 pg (ref 26.0–34.0)
MCHC: 32.8 g/dL (ref 30.0–36.0)
MCV: 88.9 fL (ref 78.0–100.0)
Platelets: 146 10*3/uL — ABNORMAL LOW (ref 150–400)
RBC: 3.8 MIL/uL — ABNORMAL LOW (ref 3.87–5.11)
RDW: 13.8 % (ref 11.5–15.5)
WBC: 8.2 10*3/uL (ref 4.0–10.5)

## 2013-06-23 LAB — PROTIME-INR
INR: 1.93 — ABNORMAL HIGH (ref 0.00–1.49)
Prothrombin Time: 21.5 seconds — ABNORMAL HIGH (ref 11.6–15.2)

## 2013-06-23 LAB — LIPID PANEL
CHOL/HDL RATIO: 2.3 ratio
Cholesterol: 121 mg/dL (ref 0–200)
HDL: 52 mg/dL (ref >39)
LDL Cholesterol: 58 mg/dL (ref 0–99)
Triglycerides: 54 mg/dL (ref <150)
VLDL: 11 mg/dL (ref 0–40)

## 2013-06-23 LAB — HEMOGLOBIN A1C
Hgb A1c MFr Bld: 5.8 % — ABNORMAL HIGH (ref <5.7)
Mean Plasma Glucose: 120 mg/dL — ABNORMAL HIGH (ref <117)

## 2013-06-23 MED ORDER — SODIUM CHLORIDE 0.9 % IJ SOLN
3.0000 mL | Freq: Two times a day (BID) | INTRAMUSCULAR | Status: DC
  Administered 2013-06-23 (×2): 3 mL via INTRAVENOUS

## 2013-06-23 MED ORDER — HYDROMORPHONE HCL PF 1 MG/ML IJ SOLN: 0.5000 mg | INTRAMUSCULAR | Status: DC | PRN

## 2013-06-23 MED ORDER — WARFARIN SODIUM 2.5 MG PO TABS
2.5000 mg | ORAL_TABLET | ORAL | Status: DC
  Filled 2013-06-23: qty 1

## 2013-06-23 MED ORDER — ASPIRIN 81 MG PO CHEW
324.0000 mg | CHEWABLE_TABLET | Freq: Every day | ORAL | Status: DC
  Administered 2013-06-23: 324 mg via ORAL
  Filled 2013-06-23: qty 4

## 2013-06-23 MED ORDER — IRBESARTAN 300 MG PO TABS
300.0000 mg | ORAL_TABLET | Freq: Every evening | ORAL | Status: DC
  Filled 2013-06-23: qty 1

## 2013-06-23 MED ORDER — ONDANSETRON HCL 4 MG PO TABS: 4.0000 mg | ORAL_TABLET | Freq: Four times a day (QID) | ORAL | Status: DC | PRN

## 2013-06-23 MED ORDER — SODIUM CHLORIDE 0.9 % IJ SOLN: 3.0000 mL | Freq: Two times a day (BID) | INTRAMUSCULAR | Status: DC

## 2013-06-23 MED ORDER — NITROGLYCERIN 0.4 MG SL SUBL: 0.4000 mg | SUBLINGUAL_TABLET | SUBLINGUAL | Status: DC | PRN

## 2013-06-23 MED ORDER — ALUM & MAG HYDROXIDE-SIMETH 200-200-20 MG/5ML PO SUSP: 30.0000 mL | Freq: Four times a day (QID) | ORAL | Status: DC | PRN

## 2013-06-23 MED ORDER — SODIUM CHLORIDE 0.9 % IV SOLN: 250.0000 mL | INTRAVENOUS | Status: DC | PRN

## 2013-06-23 MED ORDER — SODIUM CHLORIDE 0.9 % IJ SOLN: 3.0000 mL | INTRAMUSCULAR | Status: DC | PRN

## 2013-06-23 MED ORDER — ONDANSETRON HCL 4 MG/2ML IJ SOLN: 4.0000 mg | Freq: Four times a day (QID) | INTRAMUSCULAR | Status: DC | PRN

## 2013-06-23 MED ORDER — METOPROLOL TARTRATE 50 MG PO TABS
50.0000 mg | ORAL_TABLET | Freq: Two times a day (BID) | ORAL | Status: DC
  Administered 2013-06-23 (×2): 50 mg via ORAL
  Filled 2013-06-23 (×3): qty 1

## 2013-06-23 MED ORDER — ACETAMINOPHEN 650 MG RE SUPP: 650.0000 mg | Freq: Four times a day (QID) | RECTAL | Status: DC | PRN

## 2013-06-23 MED ORDER — ACETAMINOPHEN 325 MG PO TABS
650.0000 mg | ORAL_TABLET | Freq: Once | ORAL | Status: AC
  Administered 2013-06-23: 650 mg via ORAL
  Filled 2013-06-23: qty 2

## 2013-06-23 MED ORDER — INSULIN ASPART 100 UNIT/ML ~~LOC~~ SOLN: 0.0000 [IU] | Freq: Three times a day (TID) | SUBCUTANEOUS | Status: DC

## 2013-06-23 MED ORDER — OXYCODONE HCL 5 MG PO TABS: 5.0000 mg | ORAL_TABLET | ORAL | Status: DC | PRN

## 2013-06-23 MED ORDER — WARFARIN SODIUM 2.5 MG PO TABS: 2.5000 mg | ORAL_TABLET | ORAL | Status: DC

## 2013-06-23 MED ORDER — ACETAMINOPHEN 325 MG PO TABS: 650.0000 mg | ORAL_TABLET | Freq: Four times a day (QID) | ORAL | Status: DC | PRN

## 2013-06-23 MED ORDER — FAMOTIDINE 20 MG PO TABS
20.0000 mg | ORAL_TABLET | Freq: Every day | ORAL | Status: DC
  Administered 2013-06-23: 20 mg via ORAL
  Filled 2013-06-23: qty 1

## 2013-06-23 MED ORDER — POLYETHYLENE GLYCOL 3350 17 G PO PACK
17.0000 g | PACK | Freq: Every day | ORAL | Status: DC | PRN
  Filled 2013-06-23: qty 1

## 2013-06-23 MED ORDER — WARFARIN 1.25 MG HALF TABLET: 1.2500 mg | ORAL_TABLET | ORAL | Status: DC

## 2013-06-23 MED ORDER — WARFARIN - PHYSICIAN DOSING INPATIENT: Freq: Every day | Status: DC

## 2013-06-23 MED ORDER — INSULIN ASPART 100 UNIT/ML ~~LOC~~ SOLN: 0.0000 [IU] | Freq: Every day | SUBCUTANEOUS | Status: DC

## 2013-06-23 MED ORDER — SIMVASTATIN 20 MG PO TABS
20.0000 mg | ORAL_TABLET | Freq: Every day | ORAL | Status: DC
  Filled 2013-06-23: qty 1

## 2013-06-23 NOTE — Discharge Summary (Signed)
Physician Discharge Summary  LAMEKA DISLA TGG:[id number] DOB: [birth year] DOA: 06/22/2013  PCP: Vidal Schwalbe, MD  Admit date: 06/22/2013 Discharge date: 06/23/2013  Time spent: 35 minutes  Recommendations for Outpatient Follow-up:  1. Patient admitted for CP rule out  Discharge Diagnoses:  Principal Problem:   Chest pain Active Problems:   Atrial fibrillation   Diabetes mellitus without complication   Hypertension   Hyperlipidemia   Warfarin-induced coagulopathy   Renal insufficiency   Discharge Condition: Stable  Diet recommendation: Heart Healthy  Filed Weights   06/22/13 2115  Weight: 74.844 kg (165 lb)    History of present illness:  Janice [name] is a 80 y.o. female with a history of Atrial Fibrillation, DM2, HTN and Hyperlipidemia who presents to the ED with complaints of substernal chest pressure with pain radiating into her left arm and neck and face and lastied 30+ minutes . The onset was at 8 pm tonight while she was sitting watching TV. She rated the pain at a 4/10 at the worse, and the pain was associated with SOB but no nausea, vomiting, or diaphoresis. She contacted her daughter who persuaded her to go to the ED, and EMS was called , and they administered 2 SL NTGs for her pain.  Of Note: She had a Normal cardiac catheterization in 2011 by Dr. Acie Fredrickson.   Hospital Course:  Patient is a pleasant 80 year old female with a past medical history of atrial fibrillation, type 2 diabetes mellitus hypertension, who presented to the emergency room on 06/23/2013 with complaints of chest discomfort. Patient presented with chest pain having atypical features characterized as pressure-like. It appeared that she had a normal cardiac catheterization in 2011. She was brought in for overnight observation, were her troponins were cycled and remained negative. She had reported recently been under a significant amount of stress as her grandson overdosed on heroine several days ago.  She reports that this is been a difficult time for her or her family. Given hemodynamic stability, and resolution of chest pain symptoms, she was discharged in stable condition on 06/23/2013. I suspect her chest discomfort they be secondary to anxiety and perhaps bereavement from her recent loss.   Discharge Exam: Filed Vitals:   06/23/13 1016  BP: 143/68  Pulse: 59  Temp:   Resp:     General: Patient does not appear to be in acute distress, awake alert oriented Cardiovascular: Regular rate rhythm normal S1-S2 Respiratory: Clear to auscultation bilaterally no wheezing rhonchi or rales Abdomen: Soft nontender nondistended Extremities: No edema Discharge Instructions You were cared for by a hospitalist during your hospital stay. If you have any questions about your discharge medications or the care you received while you were in the hospital after you are discharged, you can call the unit and asked to speak with the hospitalist on call if the hospitalist that took care of you is not available. Once you are discharged, your primary care physician will handle any further medical issues. Please note that NO REFILLS for any discharge medications will be authorized once you are discharged, as it is imperative that you return to your primary care physician (or establish a relationship with a primary care physician if you do not have one) for your aftercare needs so that they can reassess your need for medications and monitor your lab values.  Discharge Orders   Future Orders Complete By Expires   Call MD for:  difficulty breathing, headache or visual disturbances  As directed    Call  MD for:  extreme fatigue  As directed    Call MD for:  persistant dizziness or light-headedness  As directed    Call MD for:  persistant nausea and vomiting  As directed    Call MD for:  temperature >100.4  As directed    Diet - low sodium heart healthy  As directed    Increase activity slowly  As directed         Medication List         aspirin 81 MG tablet  Take 81 mg by mouth daily.     CALCIUM/MAGNESIUM/ZINC PO  Take 1 tablet by mouth daily as needed (for vitamin).     HYDROcodone-acetaminophen 5-325 MG per tablet  Commonly known as:  NORCO/VICODIN  Take 0.5 tablets by mouth every 6 (six) hours as needed for moderate pain.     irbesartan 300 MG tablet  Commonly known as:  AVAPRO  Take 300 mg by mouth every evening.     metFORMIN 1000 MG (MOD) 24 hr tablet  Commonly known as:  GLUMETZA  Take 500 mg by mouth 2 (two) times daily with a meal.     metoprolol 50 MG tablet  Commonly known as:  LOPRESSOR  Take 50 mg by mouth 2 (two) times daily.     nitroGLYCERIN 0.4 MG SL tablet  Commonly known as:  NITROSTAT  Place 0.4 mg under the tongue every 5 (five) minutes as needed.     polyethylene glycol packet  Commonly known as:  MIRALAX / GLYCOLAX  Take 17 g by mouth daily as needed for mild constipation.     ranitidine 300 MG tablet  Commonly known as:  ZANTAC  Take 300 mg by mouth as needed for heartburn.     simvastatin 40 MG tablet  Commonly known as:  ZOCOR  Take 20 mg by mouth daily at 6 PM.     warfarin 2.5 MG tablet  Commonly known as:  COUMADIN  Take 2.5 mg by mouth See admin instructions. Take 1 tablet (2.5mg ) by mouth daily except take 1/2 tablet (1.25mg ) by mouth on Monday and thursday       Allergies  Allergen Reactions  . Demerol [Meperidine] Nausea Only    And vertigo  . Meperidine Hcl     REACTION: nausea and vomiting       Follow-up Information   Follow up with Vidal Schwalbe, MD In 1 week.   Specialty:  Family Medicine   Contact information:   [address], [place] [address] [phone]        The results of significant diagnostics from this hospitalization (including imaging, microbiology, ancillary and laboratory) are listed below for reference.    Significant Diagnostic Studies: Dg Chest Portable 1 View  06/22/2013    CLINICAL DATA:  CHEST PAIN ARM PAIN  EXAM: PORTABLE CHEST - 1 VIEW  COMPARISON:  Prior CT from 09/29/2012  FINDINGS: The cardiac and mediastinal silhouettes are stable in size and contour, and remain within normal limits.  There is mild elevation of the left hemidiaphragm with mild left basilar atelectasis. No airspace consolidation, pleural effusion, or pulmonary edema is identified. There is no pneumothorax.  No acute osseous abnormality identified. Degenerative changes noted about the acromioclavicular joints and shoulders bilaterally. Degenerative spurring present throughout the visualized spine.  IMPRESSION: Mild elevation of the left hemidiaphragm with associated mild left basilar atelectasis. No acute cardiopulmonary abnormality.   Electronically Signed   By: Pincus Badder.D.  On: 06/22/2013 23:22    Microbiology: No results found for this or any previous visit (from the past 240 hour(s)).   Labs: Basic Metabolic Panel:  Recent Labs Lab 06/22/13 2232 06/23/13 0145  NA 144 142  K 3.9 3.7  CL 105 104  CO2 24 25  GLUCOSE 95 111*  BUN 27* 29*  CREATININE 1.19* 1.20*  CALCIUM 9.3 9.2   Liver Function Tests: No results found for this basename: AST, ALT, ALKPHOS, BILITOT, PROT, ALBUMIN,  in the last 168 hours No results found for this basename: LIPASE, AMYLASE,  in the last 168 hours No results found for this basename: AMMONIA,  in the last 168 hours CBC:  Recent Labs Lab 06/22/13 2232 06/23/13 0145  WBC 8.7 8.2  HGB 11.4* 11.1*  HCT 34.1* 33.8*  MCV 88.6 88.9  PLT 150 146*   Cardiac Enzymes:  Recent Labs Lab 06/23/13 0145  TROPONINI <0.30   BNP: BNP (last 3 results) No results found for this basename: PROBNP,  in the last 8760 hours CBG:  Recent Labs Lab 06/22/13 2351 06/23/13 0114 06/23/13 0756 06/23/13 1204  GLUCAP 107* 112* 84 109*       Signed:  Izack Hoogland  [facility] 06/23/2013, 3:58 PM

## 2013-06-23 NOTE — H&P (Signed)
[facility] History and Physical  Janice [name] ZOX:[id number] DOB: 1931/04/06 DOA: 06/22/2013  Referring physician:  EDP PCP: Vidal Schwalbe, MD  Specialists:   Chief Complaint: Chest Pain  HPI: Janice [name] is a 80 y.o. female with a history of Atrial Fibrillation, DM2, HTN and Hyperlipidemia who presents to the ED with complaints of substernal chest pressure with pain radiating into her left arm and neck and face and lastied 30+ minutes .  The onset was at 8 pm tonight while she was sitting watching TV.  She rated the pain at a 4/10 at the worse, and the pain was associated with SOB but no nausea, vomiting, or diaphoresis.   She contacted her daughter who persuaded her to go to the ED, and EMS was called , and they administered 2 SL NTGs for her pain.      Of Note: She had a Normal cardiac catheterization in 2011 by Dr. Acie Fredrickson.      Review of Systems:  Constitutional: No Weight Loss, No Weight Gain, Night Sweats, Fevers, Chills, Fatigue, or Generalized Weakness HEENT: No Headaches, Difficulty Swallowing,Tooth/Dental Problems,Sore Throat,  No Sneezing, Rhinitis, Ear Ache, Nasal Congestion, or Post Nasal Drip,  Cardio-vascular:  +Chest pain, Orthopnea, PND, Edema in lower extremities, Anasarca, Dizziness, Palpitations  Resp: +Dyspnea, No DOE, No Cough, No Hemoptysis, No Wheezing.    GI: No Heartburn, Indigestion, Abdominal Pain, Nausea, Vomiting, Diarrhea, Change in Bowel Habits,  Loss of Appetite  GU: No Dysuria, Change in Color of Urine, No Urgency or Frequency.  No flank pain.  Musculoskeletal: No Joint Pain or Swelling.  No Decreased Range of Motion. No Back Pain.  Neurologic: No Syncope, No Seizures, Muscle Weakness, Paresthesia, Vision Disturbance or Loss, No Diplopia, No Vertigo, No Difficulty Walking,  Skin: No Rash or Lesions. Psych: No Change in Mood or Affect. No Depression or Anxiety. No Memory loss. No Confusion or Hallucinations   Past Medical History   Diagnosis Date  . Atrial fibrillation   . Diabetes mellitus   . Hypertension   . Hyperlipidemia   . Chest pain   . Allergic rhinitis   . Obesity   . Osteoarthritis   . Cataracts, bilateral   . Endometrial cancer   . GERD (gastroesophageal reflux disease)   . CKD (chronic kidney disease)   . Lymphedema       Past Surgical History  Procedure Laterality Date  . Cardiac catheterization  06/2009    REVEALED SMOOTH AND NORMAL CORONARY ARTERIES  . Back surgery    . Cholecystectomy    . Vaginal hysterectomy    . Appendectomy         Prior to Admission medications   Medication Sig Start Date End Date Taking? Authorizing Provider  aspirin 81 MG tablet Take 81 mg by mouth daily.     Yes Historical Provider, MD  HYDROcodone-acetaminophen (NORCO/VICODIN) 5-325 MG per tablet Take 0.5 tablets by mouth every 6 (six) hours as needed for moderate pain.   Yes Historical Provider, MD  irbesartan (AVAPRO) 300 MG tablet Take 300 mg by mouth every evening.  05/23/13  Yes Historical Provider, MD  metFORMIN (GLUMETZA) 1000 MG (MOD) 24 hr tablet Take 500 mg by mouth 2 (two) times daily with a meal.    Yes Historical Provider, MD  metoprolol (LOPRESSOR) 50 MG tablet Take 50 mg by mouth 2 (two) times daily.    Yes Historical Provider, MD  Multiple Minerals (CALCIUM/MAGNESIUM/ZINC PO) Take 1 tablet by mouth daily as needed (for  vitamin).    Yes Historical Provider, MD  nitroGLYCERIN (NITROSTAT) 0.4 MG SL tablet Place 0.4 mg under the tongue every 5 (five) minutes as needed.     Yes Historical Provider, MD  polyethylene glycol (MIRALAX / GLYCOLAX) packet Take 17 g by mouth daily as needed for mild constipation.    Yes Historical Provider, MD  ranitidine (ZANTAC) 300 MG tablet Take 300 mg by mouth as needed for heartburn.    Yes Historical Provider, MD  simvastatin (ZOCOR) 40 MG tablet Take 20 mg by mouth daily at 6 PM.   Yes Historical Provider, MD  warfarin (COUMADIN) 2.5 MG tablet Take 2.5 mg by mouth  See admin instructions. Take 1 tablet (2.5mg ) by mouth daily except take 1/2 tablet (1.25mg ) by mouth on Monday and thursday   Yes Historical Provider, MD      Allergies  Allergen Reactions  . Demerol [Meperidine] Nausea Only    And vertigo  . Meperidine Hcl     REACTION: nausea and vomiting     Social History:  reports that she has never smoked. She has never used smokeless tobacco. She reports that she does not drink alcohol or use illicit drugs.     Family History  Problem Relation Age of Onset  . Colon cancer Mother   . Kidney cancer Father   . Lung cancer Sister   . Colon cancer Sister     x2       Physical Exam:  GEN:  Pleasant Elderly Well nourished and Well Developed  80 y.o. Caucasian female  examined  and in no acute distress; cooperative with exam Filed Vitals:   06/22/13 2115 06/22/13 2249 06/22/13 2330 06/22/13 2345  BP: 140/55 132/71 127/50 127/50  Pulse:   68 70  Temp: 97.9 F (36.6 C)     TempSrc: Oral     Resp: 18 16 16 16   Height: 5\' 6"  (1.676 m)     Weight: 74.844 kg (165 lb)     SpO2: 98% 100% 97% 99%   Blood pressure 127/50, pulse 70, temperature 97.9 F (36.6 C), temperature source Oral, resp. rate 16, height 5\' 6"  (1.676 m), weight 74.844 kg (165 lb), SpO2 99.00%. PSYCH: She is alert and oriented x4; does not appear anxious does not appear depressed; affect is normal HEENT: Normocephalic and Atraumatic, Mucous membranes pink; PERRLA; EOM intact; Fundi:  Benign;  No scleral icterus, Nares: Patent, Oropharynx: Clear, Fair Dentition, Neck:  FROM, no cervical lymphadenopathy nor thyromegaly or carotid bruit; no JVD; Breasts:: Not examined CHEST WALL: No tenderness CHEST: Normal respiration, clear to auscultation bilaterally HEART: Regular rate and rhythm; no murmurs rubs or gallops BACK: No kyphosis or scoliosis; no CVA tenderness ABDOMEN: Positive Bowel Sounds, Soft non-tender; no masses, no organomegaly. Rectal Exam: Not done EXTREMITIES: No  cyanosis, clubbing or edema; no ulcerations. Genitalia: not examined PULSES: 2+ and symmetric SKIN: Normal hydration no rash or ulceration CNS:  Alert and Oriented x 4,  No Focal Deficits Vascular: pulses palpable throughout    Labs on Admission:  Basic Metabolic Panel:  Recent Labs Lab 06/22/13 2232  NA 144  K 3.9  CL 105  CO2 24  GLUCOSE 95  BUN 27*  CREATININE 1.19*  CALCIUM 9.3   Liver Function Tests: No results found for this basename: AST, ALT, ALKPHOS, BILITOT, PROT, ALBUMIN,  in the last 168 hours No results found for this basename: LIPASE, AMYLASE,  in the last 168 hours No results found for this basename: AMMONIA,  in the last 168 hours CBC:  Recent Labs Lab 06/22/13 2232  WBC 8.7  HGB 11.4*  HCT 34.1*  MCV 88.6  PLT 150   Cardiac Enzymes: No results found for this basename: CKTOTAL, CKMB, CKMBINDEX, TROPONINI,  in the last 168 hours  BNP (last 3 results) No results found for this basename: PROBNP,  in the last 8760 hours CBG:  Recent Labs Lab 06/22/13 2351  GLUCAP 107*    Radiological Exams on Admission: Dg Chest Portable 1 View  06/22/2013   CLINICAL DATA:  CHEST PAIN ARM PAIN  EXAM: PORTABLE CHEST - 1 VIEW  COMPARISON:  Prior CT from 09/29/2012  FINDINGS: The cardiac and mediastinal silhouettes are stable in size and contour, and remain within normal limits.  There is mild elevation of the left hemidiaphragm with mild left basilar atelectasis. No airspace consolidation, pleural effusion, or pulmonary edema is identified. There is no pneumothorax.  No acute osseous abnormality identified. Degenerative changes noted about the acromioclavicular joints and shoulders bilaterally. Degenerative spurring present throughout the visualized spine.  IMPRESSION: Mild elevation of the left hemidiaphragm with associated mild left basilar atelectasis. No acute cardiopulmonary abnormality.   Electronically Signed   By: Jeannine Boga M.D.   On: 06/22/2013 23:22       EKG: Independently reviewed. Atrial Fibrillation rate 63, No acute S-T changes.      Assessment/Plan:   80 y.o. female with  Principal Problem:   Chest pain Active Problems:   Atrial fibrillation   Diabetes mellitus without complication   Hypertension   Hyperlipidemia   Warfarin-induced coagulopathy   Renal insufficiency     1. Chest Pain-Telemetry monitoring, cycle Troponins,  Nitropaste, O2, ASA, and already on Beta blocker Rx, and ARB Rx, and Warfarin Rx.    2.  Atrial Fibrillation-  On Beta blocker RX, and Warfarin Rx.    3.  DM2-  Metformin on Hold, and SSI coverage ordered. Check HbA1C in AM.    4.   HTN- continue Avapro, and Metoprolol RX, Monitor BPs.    5.  Hyperlipidemia- continue Statin Rx,and Check Fasting Lipids in AM.    6.  Warfarin Induced Coagulopathy-  monitor PT/INRs  7.   Renal insufficiency- Monitr BUN/CR.       Code Status:   FULL CODE Family Communication:   Daughter at Bedside  Disposition Plan:  Observation Telemetry Bed       Time spent:  [facility] Pager [phone]  If 7PM-7AM, please contact night-coverage [email or web address] Password [email or web address] 06/23/2013, 12:16 AM

## 2013-06-23 NOTE — ED Provider Notes (Signed)
Medical screening examination/treatment/procedure(s) were conducted as a shared visit with non-physician practitioner(s) or resident and myself. I personally evaluated the patient during the encounter and agree with the findings and plan unless otherwise indicated.  I have personally reviewed any xrays and/ or EKG's with the provider and I agree with interpretation.   EKG Interpretation  Date/Time: Friday June 22 2013 21:13:56 EDT  Ventricular Rate: 63  PR Interval:  QRS Duration: 76  QT Interval: 399  QTC Calculation: 408  R Axis: 38  Text Interpretation: Atrial fibrillation Low voltage, extremity leads  Abnormal R-wave progression, early transition Confirmed by Bert Ptacek MD,  [name] ([id number]) on 06/22/2013 11:28:70 PM   80 year old female with history of atrial fibrillation, diabetes, htn, lipids presents with anterior chest pressure with radiation down the left arm. No history of similar. No history of heart attack or heart failure. EKG reviewed showing atrial fibrillation with no acute findings. With age and risk factors and recent chest pressure plan for telemetry observation. Exam patient to rate her heart rhythm, normal rate, lungs clear, abdomen soft nontender, mild lower extremity swelling bilateral. Patient denies recent surgery, active cancer, hemoptysis or blood clot history.   Chest pain, high blood pressure, atrial fibrillation, diabetes   Mariea Clonts, MD 06/23/13 0200

## 2014-03-05 ENCOUNTER — Ambulatory Visit
Admission: RE | Admit: 2014-03-05 | Discharge: 2014-03-05 | Disposition: A | Discharge status: Home / Self Care | Payer: Medicare Other | Source: Ambulatory Visit | Attending: Family Medicine | Admitting: Family Medicine

## 2014-03-05 ENCOUNTER — Other Ambulatory Visit: Payer: Self-pay | Admitting: Family Medicine

## 2014-03-05 DIAGNOSIS — R1031 Right lower quadrant pain: Secondary | ICD-10-CM

## 2014-03-05 DIAGNOSIS — J90 Pleural effusion, not elsewhere classified: Secondary | ICD-10-CM

## 2014-03-07 ENCOUNTER — Other Ambulatory Visit: Payer: Self-pay | Admitting: Specialist

## 2014-03-07 DIAGNOSIS — M47817 Spondylosis without myelopathy or radiculopathy, lumbosacral region: Secondary | ICD-10-CM

## 2014-03-07 DIAGNOSIS — M48061 Spinal stenosis, lumbar region without neurogenic claudication: Secondary | ICD-10-CM

## 2014-03-07 DIAGNOSIS — M545 Low back pain: Secondary | ICD-10-CM

## 2014-03-12 ENCOUNTER — Ambulatory Visit
Admission: RE | Admit: 2014-03-12 | Discharge: 2014-03-12 | Disposition: A | Discharge status: Home / Self Care | Payer: Medicare Other | Source: Ambulatory Visit | Attending: Specialist | Admitting: Specialist

## 2014-03-12 DIAGNOSIS — M545 Low back pain: Secondary | ICD-10-CM

## 2014-03-12 DIAGNOSIS — M48061 Spinal stenosis, lumbar region without neurogenic claudication: Secondary | ICD-10-CM

## 2014-03-12 DIAGNOSIS — M47817 Spondylosis without myelopathy or radiculopathy, lumbosacral region: Secondary | ICD-10-CM

## 2014-03-30 ENCOUNTER — Encounter: Payer: Self-pay | Admitting: Internal Medicine

## 2014-04-01 ENCOUNTER — Ambulatory Visit
Admission: RE | Admit: 2014-04-01 | Discharge: 2014-04-01 | Disposition: A | Discharge status: Home / Self Care | Payer: Medicare Other | Source: Ambulatory Visit | Attending: Family Medicine | Admitting: Family Medicine

## 2014-04-01 ENCOUNTER — Other Ambulatory Visit: Payer: Self-pay | Admitting: Family Medicine

## 2014-04-01 DIAGNOSIS — J189 Pneumonia, unspecified organism: Secondary | ICD-10-CM

## 2014-05-14 ENCOUNTER — Encounter: Payer: Self-pay | Admitting: Internal Medicine

## 2014-05-14 ENCOUNTER — Ambulatory Visit (INDEPENDENT_AMBULATORY_CARE_PROVIDER_SITE_OTHER): Payer: Medicare Other | Admitting: Internal Medicine

## 2014-05-14 VITALS — BP 120/60 | HR 56 | Ht 66.0 in | Wt 170.6 lb

## 2014-05-14 DIAGNOSIS — Z800 Family history of malignant neoplasm of digestive organs: Secondary | ICD-10-CM

## 2014-05-14 DIAGNOSIS — K5901 Slow transit constipation: Secondary | ICD-10-CM

## 2014-05-14 DIAGNOSIS — Z1211 Encounter for screening for malignant neoplasm of colon: Secondary | ICD-10-CM

## 2014-05-14 NOTE — Progress Notes (Signed)
Janice [name] February 21, 1932 [id number]  Note: This dictation was prepared with Dragon digital system. Any transcriptional errors that result from this procedure are unintentional.   History of Present Illness: This is a 81 year old white female here to discuss recall colonoscopy. She has a positive family history of colon cancer in her mother and maternal aunt. She has a personal history of uterine cancer followed by radiation. Her prior colonoscopies were in 1995, 1998 when she had a polyp, 2003 and in January 2011. She has regular bowel habits while taking MiraLAX 17 g daily. She denies abdominal pain or weight loss. She has been on Coumadin for atrial fibrillation.    Past Medical History  Diagnosis Date  . Atrial fibrillation   . Diabetes mellitus   . Hypertension   . Hyperlipidemia   . Chest pain   . Allergic rhinitis   . Obesity   . Osteoarthritis   . Cataracts, bilateral   . Endometrial cancer   . GERD (gastroesophageal reflux disease)   . CKD (chronic kidney disease)   . Lymphedema     Past Surgical History  Procedure Laterality Date  . Cardiac catheterization  06/2009    REVEALED SMOOTH AND NORMAL CORONARY ARTERIES  . Back surgery    . Cholecystectomy    . Vaginal hysterectomy    . Appendectomy      Allergies  Allergen Reactions  . Demerol [Meperidine] Nausea Only    And vertigo  . Meperidine Hcl     REACTION: nausea and vomiting    Family history and social history have been reviewed.  Review of Systems: Regular bowel habits. Denies rectal bleeding or abdominal pain  The remainder of the 10 point ROS is negative except as outlined in the H&P  Physical Exam: General Appearance Well developed, in no distress Eyes  Non icteric  HEENT  Non traumatic, normocephalic  Mouth No lesion, tongue papillated, no cheilosis Neck Supple without adenopathy, thyroid not enlarged, no carotid bruits, no JVD Lungs Clear to auscultation bilaterally COR Normal S1, normal  S2, regular rhythm, no murmur, quiet precordium Abdomen soft nontender with normoactive bowel sounds. No scars. No distention Rectal not done Extremities  1+ pedal edema Skin No lesions Neurological Alert and oriented x 3 Psychological Normal mood and affect  Assessment and Plan:   81 year old white female with strong family history of  colon cancer in her mother and maternal aunt and personal history of colon polyps. She  Has undergone 4 colonoscopies in the past  and  does not want  to continue colonoscopies screening. She would prefer to come only when she has problems with her bowel habits and I support her decision.She is aware of the slight risk of having colon cancer in the future. She will let us know if there are any problems   Delfin Edis 05/14/2014

## 2014-05-14 NOTE — Patient Instructions (Addendum)
Please follow up as needed Dr Harlan Stains

## 2014-06-04 ENCOUNTER — Other Ambulatory Visit: Payer: Self-pay | Admitting: Family Medicine

## 2014-06-04 ENCOUNTER — Ambulatory Visit
Admission: RE | Admit: 2014-06-04 | Discharge: 2014-06-04 | Disposition: A | Discharge status: Home / Self Care | Payer: Medicare Other | Source: Ambulatory Visit | Attending: Family Medicine | Admitting: Family Medicine

## 2014-06-04 DIAGNOSIS — M25551 Pain in right hip: Secondary | ICD-10-CM

## 2014-06-04 DIAGNOSIS — M25561 Pain in right knee: Secondary | ICD-10-CM

## 2014-09-16 ENCOUNTER — Emergency Department (HOSPITAL_COMMUNITY): Payer: Medicare Other

## 2014-09-16 ENCOUNTER — Observation Stay (HOSPITAL_COMMUNITY)
Admission: EM | Admit: 2014-09-16 | Discharge: 2014-09-18 | Disposition: A | Discharge status: Skilled Nursing Facility | Payer: Medicare Other | Attending: Internal Medicine | Admitting: Internal Medicine

## 2014-09-16 ENCOUNTER — Encounter (HOSPITAL_COMMUNITY): Payer: Self-pay | Admitting: Emergency Medicine

## 2014-09-16 DIAGNOSIS — I129 Hypertensive chronic kidney disease with stage 1 through stage 4 chronic kidney disease, or unspecified chronic kidney disease: Secondary | ICD-10-CM | POA: Diagnosis not present

## 2014-09-16 DIAGNOSIS — E119 Type 2 diabetes mellitus without complications: Secondary | ICD-10-CM

## 2014-09-16 DIAGNOSIS — Z7901 Long term (current) use of anticoagulants: Secondary | ICD-10-CM | POA: Diagnosis not present

## 2014-09-16 DIAGNOSIS — W108XXA Fall (on) (from) other stairs and steps, initial encounter: Secondary | ICD-10-CM | POA: Diagnosis not present

## 2014-09-16 DIAGNOSIS — S76011A Strain of muscle, fascia and tendon of right hip, initial encounter: Secondary | ICD-10-CM | POA: Insufficient documentation

## 2014-09-16 DIAGNOSIS — Y92008 Other place in unspecified non-institutional (private) residence as the place of occurrence of the external cause: Secondary | ICD-10-CM | POA: Insufficient documentation

## 2014-09-16 DIAGNOSIS — I890 Lymphedema, not elsewhere classified: Secondary | ICD-10-CM | POA: Insufficient documentation

## 2014-09-16 DIAGNOSIS — Z79899 Other long term (current) drug therapy: Secondary | ICD-10-CM | POA: Insufficient documentation

## 2014-09-16 DIAGNOSIS — S3210XA Unspecified fracture of sacrum, initial encounter for closed fracture: Secondary | ICD-10-CM | POA: Diagnosis present

## 2014-09-16 DIAGNOSIS — M542 Cervicalgia: Secondary | ICD-10-CM | POA: Diagnosis not present

## 2014-09-16 DIAGNOSIS — Z7982 Long term (current) use of aspirin: Secondary | ICD-10-CM | POA: Diagnosis not present

## 2014-09-16 DIAGNOSIS — Z6828 Body mass index (BMI) 28.0-28.9, adult: Secondary | ICD-10-CM | POA: Insufficient documentation

## 2014-09-16 DIAGNOSIS — I4891 Unspecified atrial fibrillation: Secondary | ICD-10-CM | POA: Diagnosis present

## 2014-09-16 DIAGNOSIS — E669 Obesity, unspecified: Secondary | ICD-10-CM | POA: Diagnosis not present

## 2014-09-16 DIAGNOSIS — Z79891 Long term (current) use of opiate analgesic: Secondary | ICD-10-CM | POA: Diagnosis not present

## 2014-09-16 DIAGNOSIS — M25562 Pain in left knee: Secondary | ICD-10-CM | POA: Diagnosis not present

## 2014-09-16 DIAGNOSIS — M1990 Unspecified osteoarthritis, unspecified site: Secondary | ICD-10-CM | POA: Insufficient documentation

## 2014-09-16 DIAGNOSIS — E785 Hyperlipidemia, unspecified: Secondary | ICD-10-CM | POA: Diagnosis not present

## 2014-09-16 DIAGNOSIS — K219 Gastro-esophageal reflux disease without esophagitis: Secondary | ICD-10-CM | POA: Diagnosis not present

## 2014-09-16 DIAGNOSIS — M25561 Pain in right knee: Secondary | ICD-10-CM | POA: Insufficient documentation

## 2014-09-16 DIAGNOSIS — E1140 Type 2 diabetes mellitus with diabetic neuropathy, unspecified: Secondary | ICD-10-CM | POA: Insufficient documentation

## 2014-09-16 DIAGNOSIS — N189 Chronic kidney disease, unspecified: Secondary | ICD-10-CM | POA: Insufficient documentation

## 2014-09-16 DIAGNOSIS — Z8542 Personal history of malignant neoplasm of other parts of uterus: Secondary | ICD-10-CM | POA: Diagnosis not present

## 2014-09-16 DIAGNOSIS — I10 Essential (primary) hypertension: Secondary | ICD-10-CM | POA: Diagnosis present

## 2014-09-16 DIAGNOSIS — S32591A Other specified fracture of right pubis, initial encounter for closed fracture: Secondary | ICD-10-CM | POA: Diagnosis not present

## 2014-09-16 DIAGNOSIS — M25559 Pain in unspecified hip: Secondary | ICD-10-CM

## 2014-09-16 DIAGNOSIS — M25551 Pain in right hip: Secondary | ICD-10-CM | POA: Diagnosis present

## 2014-09-16 DIAGNOSIS — W19XXXA Unspecified fall, initial encounter: Secondary | ICD-10-CM

## 2014-09-16 DIAGNOSIS — E1136 Type 2 diabetes mellitus with diabetic cataract: Secondary | ICD-10-CM | POA: Insufficient documentation

## 2014-09-16 DIAGNOSIS — S329XXA Fracture of unspecified parts of lumbosacral spine and pelvis, initial encounter for closed fracture: Secondary | ICD-10-CM

## 2014-09-16 LAB — CBC WITH DIFFERENTIAL/PLATELET
Basophils Absolute: 0.1 10*3/uL (ref 0.0–0.1)
Basophils Relative: 1 % (ref 0–1)
Eosinophils Absolute: 0.3 10*3/uL (ref 0.0–0.7)
Eosinophils Relative: 3 % (ref 0–5)
HEMATOCRIT: 43.4 % (ref 36.0–46.0)
Hemoglobin: 14.3 g/dL (ref 12.0–15.0)
Lymphocytes Relative: 22 % (ref 12–46)
Lymphs Abs: 2.3 10*3/uL (ref 0.7–4.0)
MCH: 31.2 pg (ref 26.0–34.0)
MCHC: 32.9 g/dL (ref 30.0–36.0)
MCV: 94.8 fL (ref 78.0–100.0)
Monocytes Absolute: 1.1 10*3/uL — ABNORMAL HIGH (ref 0.1–1.0)
Monocytes Relative: 11 % (ref 3–12)
NEUTROS PCT: 63 % (ref 43–77)
Neutro Abs: 6.8 10*3/uL (ref 1.7–7.7)
PLATELETS: 162 10*3/uL (ref 150–400)
RBC: 4.58 MIL/uL (ref 3.87–5.11)
RDW: 12.2 % (ref 11.5–15.5)
WBC: 10.7 10*3/uL — AB (ref 4.0–10.5)

## 2014-09-16 LAB — BASIC METABOLIC PANEL
Anion gap: 10 (ref 5–15)
BUN: 28 mg/dL — AB (ref 6–20)
CO2: 26 mmol/L (ref 22–32)
CREATININE: 1.17 mg/dL — AB (ref 0.44–1.00)
Calcium: 9.1 mg/dL (ref 8.9–10.3)
Chloride: 102 mmol/L (ref 101–111)
GFR calc Af Amer: 50 mL/min — ABNORMAL LOW (ref >60)
GFR calc non Af Amer: 43 mL/min — ABNORMAL LOW (ref >60)
Glucose, Bld: 85 mg/dL (ref 65–99)
POTASSIUM: 3.9 mmol/L (ref 3.5–5.1)
Sodium: 138 mmol/L (ref 135–145)

## 2014-09-16 LAB — PROTIME-INR
INR: 1.77 — ABNORMAL HIGH (ref 0.00–1.49)
Prothrombin Time: 20.6 seconds — ABNORMAL HIGH (ref 11.6–15.2)

## 2014-09-16 MED ORDER — HYDROCODONE-ACETAMINOPHEN 5-325 MG PO TABS
0.5000 | ORAL_TABLET | Freq: Once | ORAL | Status: AC
  Administered 2014-09-16: 0.5 via ORAL
  Filled 2014-09-16: qty 1

## 2014-09-16 MED ORDER — HYDROCODONE-ACETAMINOPHEN 5-325 MG PO TABS
1.0000 | ORAL_TABLET | Freq: Once | ORAL | Status: AC
Start: 2014-09-17 — End: 2014-09-17
  Administered 2014-09-17: 1 via ORAL
  Filled 2014-09-16: qty 1

## 2014-09-16 NOTE — ED Notes (Signed)
Bed: WA09 Expected date:  Expected time:  Means of arrival:  Comments: Ems Fall to Hip Knee

## 2014-09-16 NOTE — ED Notes (Addendum)
Pt provided graham crackers and peanut butter per family request, concerning "blood sugar". Pt A&O x4, non diaphoretic upon entry. Pt states pain is now a 6/10. Pt and family member state "she still can't bear weight on that leg". Family at bedside. Informed pt EDP will be in to update on status. No further concerns. In NAD.

## 2014-09-16 NOTE — ED Notes (Addendum)
Patient transported to X-ray 

## 2014-09-16 NOTE — ED Notes (Signed)
Pt was unable to ambulate could not bare weight because of pain on her right side.

## 2014-09-16 NOTE — ED Notes (Signed)
Pt was stepped on to a flight of 4 wooden stairs when stair gave way causing pt to fall. Pt has R hip pain and L knee pain. Pt reports she was able to stand after fall and sit in a chair. Hx of arthritis in hips and knees. Did not hit head, no LOC. Pt on warfarin. No obvious deformity.

## 2014-09-16 NOTE — ED Provider Notes (Signed)
CSN: [id number]     Arrival date & time 09/16/14  1524 History   First MD Initiated Contact with Patient 09/16/14 1551     Chief Complaint  Patient presents with  . Fall     The history is provided by the patient. No language interpreter was used.   Ms. Dolney presents for evaluation of injuries following a fall. She was walking down the steps at her sister's house when the staircase broke. She fell to the ground, kind of to the side about 4 steps. She struck her head on the side of a trailer. She states it is a soft blow. No loss of consciousness. She had some neck pain at that time which she related to her arthritis. Her neck is no longer hurting. She has pain in bilateral hips, left greater than right and bilateral knees left greater than right. She has considerable pain with trying to stand. She takes Coumadin for atrial fibrillation. Symptoms are moderate and constant.  Past Medical History  Diagnosis Date  . Atrial fibrillation   . Diabetes mellitus   . Hypertension   . Hyperlipidemia   . Chest pain   . Allergic rhinitis   . Obesity   . Osteoarthritis   . Cataracts, bilateral   . Endometrial cancer   . GERD (gastroesophageal reflux disease)   . CKD (chronic kidney disease)   . Lymphedema    Past Surgical History  Procedure Laterality Date  . Cardiac catheterization  06/2009    REVEALED SMOOTH AND NORMAL CORONARY ARTERIES  . Back surgery    . Cholecystectomy    . Vaginal hysterectomy    . Appendectomy     Family History  Problem Relation Age of Onset  . Colon cancer Mother   . Kidney cancer Father   . Lung cancer Sister   . Colon cancer Sister     x2   History  Substance Use Topics  . Smoking status: Never Smoker   . Smokeless tobacco: Never Used  . Alcohol Use: No   OB History    No data available     Review of Systems  All other systems reviewed and are negative.     Allergies  Demerol and Meperidine hcl  Home Medications   Prior to Admission  medications   Medication Sig Start Date End Date Taking? Authorizing Provider  ALPRAZolam Duanne Moron) 0.25 MG tablet Take 0.25 mg by mouth daily as needed for anxiety.    Yes Historical Provider, MD  amLODipine (NORVASC) 2.5 MG tablet Take 2.5 mg by mouth daily.   Yes Historical Provider, MD  aspirin 81 MG tablet Take 81 mg by mouth daily.     Yes Historical Provider, MD  Cyanocobalamin (VITAMIN B-12) 500 MCG SUBL Place 1 tablet under the tongue daily.   Yes Historical Provider, MD  HYDROcodone-acetaminophen (NORCO/VICODIN) 5-325 MG per tablet Take 0.5 tablets by mouth every 6 (six) hours as needed for moderate pain.   Yes Historical Provider, MD  irbesartan (AVAPRO) 300 MG tablet Take 300 mg by mouth daily.  05/23/13  Yes Historical Provider, MD  LUTEIN PO Take 1 tablet by mouth daily.   Yes Historical Provider, MD  metoprolol (LOPRESSOR) 50 MG tablet Take 50 mg by mouth 2 (two) times daily.    Yes Historical Provider, MD  nitroGLYCERIN (NITROSTAT) 0.4 MG SL tablet Place 0.4 mg under the tongue every 5 (five) minutes as needed.     Yes Historical Provider, MD  polyethylene glycol (MIRALAX /  GLYCOLAX) packet Take 17 g by mouth daily as needed for mild constipation.    Yes Historical Provider, MD  simvastatin (ZOCOR) 40 MG tablet Take 20 mg by mouth daily at 6 PM.   Yes Historical Provider, MD  warfarin (COUMADIN) 2.5 MG tablet Take 2.5 mg by mouth daily.    Yes Historical Provider, MD  metFORMIN (GLUMETZA) 1000 MG (MOD) 24 hr tablet Take 500 mg by mouth 2 (two) times daily with a meal.     Historical Provider, MD   BP 184/72 mmHg  Pulse 68  Temp(Src) 97.9 F (36.6 C) (Oral)  Resp 16  SpO2 96% Physical Exam  Constitutional: She is oriented to person, place, and time. She appears well-developed and well-nourished.  HENT:  Head: Normocephalic and atraumatic.  Neck:  No C-spine tenderness  Cardiovascular: Normal rate and regular rhythm.   No murmur heard. Pulmonary/Chest: Effort normal and  breath sounds normal. No respiratory distress.  Abdominal: Soft. There is no tenderness. There is no rebound and no guarding.  Musculoskeletal:  2+ nonpitting edema bilateral lower extremities. Moderate tenderness to palpation over bilateral hips, left greater than right. No tenderness over the left knee. 2+ DP pulses bilaterally  Neurological: She is alert and oriented to person, place, and time.  5 out of 5 strength in all 4 extremities  Skin: Skin is warm and dry.  Psychiatric: She has a normal mood and affect. Her behavior is normal.  Nursing note and vitals reviewed.   ED Course  Procedures (including critical care time) Labs Review Labs Reviewed  PROTIME-INR - Abnormal; Notable for the following:    Prothrombin Time 20.6 (*)    INR 1.77 (*)    All other components within normal limits  BASIC METABOLIC PANEL - Abnormal; Notable for the following:    BUN 28 (*)    Creatinine, Ser 1.17 (*)    GFR calc non Af Amer 43 (*)    GFR calc Af Amer 50 (*)    All other components within normal limits  CBC WITH DIFFERENTIAL/PLATELET - Abnormal; Notable for the following:    WBC 10.7 (*)    Monocytes Absolute 1.1 (*)    All other components within normal limits    Imaging Review Ct Head Wo Contrast  09/16/2014   CLINICAL DATA:  Initial encounter for fall without head trauma. History of atrial fibrillation. Diabetes.  EXAM: CT HEAD WITHOUT CONTRAST  CT CERVICAL SPINE WITHOUT CONTRAST  TECHNIQUE: Multidetector CT imaging of the head and cervical spine was performed following the standard protocol without intravenous contrast. Multiplanar CT image reconstructions of the cervical spine were also generated.  COMPARISON:  Head CT of 08/12/2009. No prior cervical spine imaging.  FINDINGS: CT HEAD FINDINGS  Sinuses/Soft tissues: No significant soft tissue swelling. Hyperostosis frontalis interna. Clear paranasal sinuses and mastoid air cells. No skull fracture.  Intracranial: Expected cerebral volume  loss for age. Mild low density in the periventricular white matter likely related to small vessel disease. No mass lesion, hemorrhage, hydrocephalus, acute infarct, intra-axial, or extra-axial fluid collection.  CT CERVICAL SPINE FINDINGS  Spinal visualization through the mid T1 level. Prevertebral soft tissues are within normal limits. Subcentimeter left thyroid nodule which is nonspecific. No apical pneumothorax. Marked spondylosis. Prominent osteophytes at C4 through C7. At C4-5, right neural foraminal and central canal stenosis. At C5-6, there is severe central canal and bilateral neural foraminal narrowing. Straightening of expected lordosis with maintenance of vertebral body height. Heterogeneous osteopenia. Advanced facet arthropathy at  multiple levels, most marked in the lower cervical spine. Coronal reformats demonstrate only degenerative change involving the C1-2 articulation.  IMPRESSION: 1.  No acute intracranial abnormality. 2.  Cerebral atrophy and small vessel ischemic change. 3. Advanced cervical spondylosis, without acute fracture or subluxation. This results in central canal and neural foraminal narrowing, which could predispose the patient to cord injury in the setting of trauma. 4. Straightening of expected cervical lordosis could be positional, due to muscular spasm, or ligamentous injury.   Electronically Signed   By: Abigail Miyamoto M.D.   On: 09/16/2014 17:33   Ct Cervical Spine Wo Contrast  09/16/2014   CLINICAL DATA:  Initial encounter for fall without head trauma. History of atrial fibrillation. Diabetes.  EXAM: CT HEAD WITHOUT CONTRAST  CT CERVICAL SPINE WITHOUT CONTRAST  TECHNIQUE: Multidetector CT imaging of the head and cervical spine was performed following the standard protocol without intravenous contrast. Multiplanar CT image reconstructions of the cervical spine were also generated.  COMPARISON:  Head CT of 08/12/2009. No prior cervical spine imaging.  FINDINGS: CT HEAD FINDINGS   Sinuses/Soft tissues: No significant soft tissue swelling. Hyperostosis frontalis interna. Clear paranasal sinuses and mastoid air cells. No skull fracture.  Intracranial: Expected cerebral volume loss for age. Mild low density in the periventricular white matter likely related to small vessel disease. No mass lesion, hemorrhage, hydrocephalus, acute infarct, intra-axial, or extra-axial fluid collection.  CT CERVICAL SPINE FINDINGS  Spinal visualization through the mid T1 level. Prevertebral soft tissues are within normal limits. Subcentimeter left thyroid nodule which is nonspecific. No apical pneumothorax. Marked spondylosis. Prominent osteophytes at C4 through C7. At C4-5, right neural foraminal and central canal stenosis. At C5-6, there is severe central canal and bilateral neural foraminal narrowing. Straightening of expected lordosis with maintenance of vertebral body height. Heterogeneous osteopenia. Advanced facet arthropathy at multiple levels, most marked in the lower cervical spine. Coronal reformats demonstrate only degenerative change involving the C1-2 articulation.  IMPRESSION: 1.  No acute intracranial abnormality. 2.  Cerebral atrophy and small vessel ischemic change. 3. Advanced cervical spondylosis, without acute fracture or subluxation. This results in central canal and neural foraminal narrowing, which could predispose the patient to cord injury in the setting of trauma. 4. Straightening of expected cervical lordosis could be positional, due to muscular spasm, or ligamentous injury.   Electronically Signed   By: Abigail Miyamoto M.D.   On: 09/16/2014 17:33   Dg Hips Bilat With Pelvis 3-4 Views  09/16/2014   CLINICAL DATA:  [place] through the stairs today at her sisters house. Bilateral hip pain, right worse than left.  EXAM: BILATERAL HIP (WITH PELVIS) 3-4 VIEWS  COMPARISON:  None.  FINDINGS: No evidence of pelvic fracture. Left hip shows mild osteoarthritis with marginal osteophytes. The right hip  shows more advanced osteoarthritis with more extensive marginal osteophytes. There is also calcification between the femur and the ischium probably related to old injury with heterotopic change.  IMPRESSION: No acute traumatic finding. Osteoarthritis of both hips. Heterotopic calcification between the right femur in the ischium probably related to old injury.   Electronically Signed   By: Nelson Chimes M.D.   On: 09/16/2014 17:55     EKG Interpretation None      MDM   Final diagnoses:  Hip pain  Pelvic fracture, closed, initial encounter    Patient here for evaluation of injuries following a mechanical fall. Patient initially refusing pain medications but on repeat evaluation she is requesting something for pain but  doesn't want anything strong. On initial evaluation patient had considerable tenderness over bilateral hips. On repeat evaluation patient was able to range the left hip but not able to range the right hip. MRI obtained to evaluate for occult fracture. MRI with pelvic fractures.  Pt unable to bear weight on RLE.  Discussed with pt and family findings, plan to admit.  Discussed with Dr Erlinda Hong with [facility].  Medicine contacted for admission.    Pt did not have neck pain on recheck, able to range neck without difficulty.  She did have initial neck soreness that she attributes to her usual arthritis.    Quintella Reichert, MD 09/17/14 0000

## 2014-09-16 NOTE — Progress Notes (Signed)
CSW met with patient at bedside. Sister was present. Patient confirms that she presents to [facility] due to fall.   Patient states that she fell while at her sisters house. Patient stated " Her steps fell apart. Her grandson and I were on the same steps and we were too heavy."  Patient informed CSW that she does not fall often. She states that she has fallen x1 in the past 6 months. Also, patient states that she completes her ADL's independently.   Patient informed CSW that she lives alone in an apartment in [place]. Patient states that her sister and niece are her primary supports. Sister stated " I could check on her everyday if I needed to."   Patient stated that she is not interested in a facility at this time.  Sister/Gene Reese [phone]   , LCSWA [phone] ED CSW 09/16/2014 5:05 PM   

## 2014-09-16 NOTE — ED Notes (Signed)
Pt still in MRI 

## 2014-09-16 NOTE — ED Notes (Signed)
Spoke w/ pt and her family, updated on what is happening next, pt in no distress, no new request at this time.

## 2014-09-17 ENCOUNTER — Encounter (HOSPITAL_COMMUNITY): Payer: Self-pay | Admitting: General Practice

## 2014-09-17 DIAGNOSIS — E119 Type 2 diabetes mellitus without complications: Secondary | ICD-10-CM | POA: Diagnosis not present

## 2014-09-17 DIAGNOSIS — S329XXA Fracture of unspecified parts of lumbosacral spine and pelvis, initial encounter for closed fracture: Secondary | ICD-10-CM | POA: Diagnosis not present

## 2014-09-17 DIAGNOSIS — I482 Chronic atrial fibrillation: Secondary | ICD-10-CM

## 2014-09-17 DIAGNOSIS — S3210XA Unspecified fracture of sacrum, initial encounter for closed fracture: Secondary | ICD-10-CM | POA: Diagnosis present

## 2014-09-17 DIAGNOSIS — S3210XD Unspecified fracture of sacrum, subsequent encounter for fracture with routine healing: Secondary | ICD-10-CM

## 2014-09-17 DIAGNOSIS — Z7901 Long term (current) use of anticoagulants: Secondary | ICD-10-CM

## 2014-09-17 LAB — CBC WITH DIFFERENTIAL/PLATELET
Basophils Absolute: 0.1 10*3/uL (ref 0.0–0.1)
Basophils Relative: 1 % (ref 0–1)
Eosinophils Absolute: 0.2 10*3/uL (ref 0.0–0.7)
Eosinophils Relative: 3 % (ref 0–5)
HCT: 38.9 % (ref 36.0–46.0)
Hemoglobin: 12.7 g/dL (ref 12.0–15.0)
Lymphocytes Relative: 22 % (ref 12–46)
Lymphs Abs: 1.7 10*3/uL (ref 0.7–4.0)
MCH: 30.6 pg (ref 26.0–34.0)
MCHC: 32.6 g/dL (ref 30.0–36.0)
MCV: 93.7 fL (ref 78.0–100.0)
MONOS PCT: 14 % — AB (ref 3–12)
Monocytes Absolute: 1.1 10*3/uL — ABNORMAL HIGH (ref 0.1–1.0)
NEUTROS ABS: 4.7 10*3/uL (ref 1.7–7.7)
NEUTROS PCT: 60 % (ref 43–77)
Platelets: 133 10*3/uL — ABNORMAL LOW (ref 150–400)
RBC: 4.15 MIL/uL (ref 3.87–5.11)
RDW: 12.2 % (ref 11.5–15.5)
WBC: 7.8 10*3/uL (ref 4.0–10.5)

## 2014-09-17 LAB — COMPREHENSIVE METABOLIC PANEL
ALT: 17 U/L (ref 14–54)
AST: 26 U/L (ref 15–41)
Albumin: 3.4 g/dL — ABNORMAL LOW (ref 3.5–5.0)
Alkaline Phosphatase: 102 U/L (ref 38–126)
Anion gap: 8 (ref 5–15)
BUN: 25 mg/dL — ABNORMAL HIGH (ref 6–20)
CHLORIDE: 104 mmol/L (ref 101–111)
CO2: 27 mmol/L (ref 22–32)
Calcium: 8.9 mg/dL (ref 8.9–10.3)
Creatinine, Ser: 1.15 mg/dL — ABNORMAL HIGH (ref 0.44–1.00)
GFR, EST AFRICAN AMERICAN: 51 mL/min — AB (ref >60)
GFR, EST NON AFRICAN AMERICAN: 44 mL/min — AB (ref >60)
GLUCOSE: 110 mg/dL — AB (ref 65–99)
Potassium: 4.1 mmol/L (ref 3.5–5.1)
SODIUM: 139 mmol/L (ref 135–145)
Total Bilirubin: 1 mg/dL (ref 0.3–1.2)
Total Protein: 6.2 g/dL — ABNORMAL LOW (ref 6.5–8.1)

## 2014-09-17 LAB — GLUCOSE, CAPILLARY
GLUCOSE-CAPILLARY: 106 mg/dL — AB (ref 65–99)
GLUCOSE-CAPILLARY: 100 mg/dL — AB (ref 65–99)
Glucose-Capillary: 170 mg/dL — ABNORMAL HIGH (ref 65–99)
Glucose-Capillary: 97 mg/dL (ref 65–99)
Glucose-Capillary: 73 mg/dL (ref 65–99)

## 2014-09-17 LAB — PROTIME-INR
INR: 1.78 — AB (ref 0.00–1.49)
Prothrombin Time: 20.7 seconds — ABNORMAL HIGH (ref 11.6–15.2)

## 2014-09-17 MED ORDER — ASPIRIN 81 MG PO CHEW
81.0000 mg | CHEWABLE_TABLET | Freq: Every day | ORAL | Status: DC
  Administered 2014-09-17 – 2014-09-18 (×2): 81 mg via ORAL
  Filled 2014-09-17: qty 1

## 2014-09-17 MED ORDER — SIMVASTATIN 20 MG PO TABS
20.0000 mg | ORAL_TABLET | Freq: Every day | ORAL | Status: DC
  Administered 2014-09-17: 20 mg via ORAL
  Filled 2014-09-17 (×2): qty 1

## 2014-09-17 MED ORDER — POLYETHYLENE GLYCOL 3350 17 G PO PACK: 17.0000 g | PACK | Freq: Every day | ORAL | Status: DC | PRN

## 2014-09-17 MED ORDER — METOPROLOL TARTRATE 50 MG PO TABS
50.0000 mg | ORAL_TABLET | Freq: Two times a day (BID) | ORAL | Status: DC
  Administered 2014-09-18: 50 mg via ORAL
  Filled 2014-09-17 (×2): qty 1

## 2014-09-17 MED ORDER — CALCITONIN (SALMON) 200 UNIT/ACT NA SOLN
1.0000 | Freq: Every day | NASAL | Status: DC
  Administered 2014-09-18: 1 via NASAL
  Filled 2014-09-17: qty 3.7

## 2014-09-17 MED ORDER — AMLODIPINE BESYLATE 2.5 MG PO TABS
2.5000 mg | ORAL_TABLET | Freq: Every day | ORAL | Status: DC
  Administered 2014-09-17 – 2014-09-18 (×2): 2.5 mg via ORAL
  Filled 2014-09-17 (×2): qty 1

## 2014-09-17 MED ORDER — INSULIN ASPART 100 UNIT/ML ~~LOC~~ SOLN: 0.0000 [IU] | Freq: Every day | SUBCUTANEOUS | Status: DC

## 2014-09-17 MED ORDER — METHOCARBAMOL 1000 MG/10ML IJ SOLN
500.0000 mg | Freq: Four times a day (QID) | INTRAVENOUS | Status: DC | PRN
  Filled 2014-09-17: qty 5

## 2014-09-17 MED ORDER — METHOCARBAMOL 500 MG PO TABS
500.0000 mg | ORAL_TABLET | Freq: Four times a day (QID) | ORAL | Status: DC | PRN
  Administered 2014-09-17: 500 mg via ORAL
  Filled 2014-09-17: qty 1

## 2014-09-17 MED ORDER — SENNA 8.6 MG PO TABS
1.0000 | ORAL_TABLET | Freq: Two times a day (BID) | ORAL | Status: DC
  Administered 2014-09-17 – 2014-09-18 (×4): 8.6 mg via ORAL
  Filled 2014-09-17 (×4): qty 1

## 2014-09-17 MED ORDER — INSULIN ASPART 100 UNIT/ML ~~LOC~~ SOLN: 0.0000 [IU] | Freq: Three times a day (TID) | SUBCUTANEOUS | Status: DC

## 2014-09-17 MED ORDER — IRBESARTAN 300 MG PO TABS
300.0000 mg | ORAL_TABLET | Freq: Every day | ORAL | Status: DC
  Administered 2014-09-17 – 2014-09-18 (×2): 300 mg via ORAL
  Filled 2014-09-17 (×2): qty 1

## 2014-09-17 MED ORDER — ALPRAZOLAM 0.25 MG PO TABS: 0.2500 mg | ORAL_TABLET | Freq: Every day | ORAL | Status: DC | PRN

## 2014-09-17 MED ORDER — OXYCODONE HCL 5 MG PO TABS: 5.0000 mg | ORAL_TABLET | ORAL | Status: DC | PRN

## 2014-09-17 MED ORDER — OXYCODONE HCL 5 MG PO TABS
5.0000 mg | ORAL_TABLET | ORAL | Status: DC | PRN
  Administered 2014-09-17 – 2014-09-18 (×3): 5 mg via ORAL
  Filled 2014-09-17 (×3): qty 1

## 2014-09-17 NOTE — H&P (Signed)
[facility] History and Physical  Patient: Janice [name]  MRN: [id number]  DOB: 1931/05/23  DOS: the patient was seen and examined on 09/17/2014 PCP: Vidal Schwalbe, MD  Referring physician: Dr. Ayesha Rumpf Chief Complaint: Fall  HPI: Janice [name] is a 81 y.o. female with Past medical history of diabetes mellitus, hypertension, dyslipidemia, GERD, chronic lymphedema, atrial fibrillation on warfarin. The patient is presenting with a mechanical fall. The patient was climbing down the stairs out of a trailer and suddenly the stairs broke. She was climbing along with her grandson who fell on her. She also complains of having hitting her head to the trailer. She denies passing out she denies any headache at the time of my evaluation no dizziness no lightheadedness no vertigo no speech difficulty no blurring of the vision no swallowing difficulty. She denies any chest pain chest heaviness chest tightness or shortness of breath. She denies having any fever chills cough nausea or vomiting. He denies any diarrhea or abdominal pain. She has been passing gas and also has been able to control her bowel movements. She also has been able to control her urination. Although she complains of urinating more than her usual as well as has involuntary has more than her usual. She denies any burning urination. She denies any new focal deficit in her lower extremity. She says due to her neuropathy she has some tingling and numbness on both her feet.  The patient is coming from home.  At her baseline ambulates without any support or sometimes with cane And is independent for most of her ADL manages her medication on her own.  Review of Systems: as mentioned in the history of present illness.  A comprehensive review of the other systems is negative.  Past Medical History  Diagnosis Date  . Atrial fibrillation   . Diabetes mellitus   . Hypertension   . Hyperlipidemia   . Chest pain   . Allergic  rhinitis   . Obesity   . Osteoarthritis   . Cataracts, bilateral   . Endometrial cancer   . GERD (gastroesophageal reflux disease)   . CKD (chronic kidney disease)   . Lymphedema    Past Surgical History  Procedure Laterality Date  . Cardiac catheterization  06/2009    REVEALED SMOOTH AND NORMAL CORONARY ARTERIES  . Back surgery    . Cholecystectomy    . Vaginal hysterectomy    . Appendectomy     Social History:  reports that she has never smoked. She has never used smokeless tobacco. She reports that she does not drink alcohol or use illicit drugs.  Allergies  Allergen Reactions  . Demerol [Meperidine] Nausea Only    And vertigo  . Meperidine Hcl     REACTION: nausea and vomiting    Family History  Problem Relation Age of Onset  . Colon cancer Mother   . Kidney cancer Father   . Lung cancer Sister   . Colon cancer Sister     x2    Prior to Admission medications   Medication Sig Start Date End Date Taking? Authorizing Provider  ALPRAZolam Duanne Moron) 0.25 MG tablet Take 0.25 mg by mouth daily as needed for anxiety.    Yes Historical Provider, MD  amLODipine (NORVASC) 2.5 MG tablet Take 2.5 mg by mouth daily.   Yes Historical Provider, MD  aspirin 81 MG tablet Take 81 mg by mouth daily.     Yes Historical Provider, MD  Cyanocobalamin (VITAMIN B-12) 500 MCG SUBL Place  1 tablet under the tongue daily.   Yes Historical Provider, MD  HYDROcodone-acetaminophen (NORCO/VICODIN) 5-325 MG per tablet Take 0.5 tablets by mouth every 6 (six) hours as needed for moderate pain.   Yes Historical Provider, MD  irbesartan (AVAPRO) 300 MG tablet Take 300 mg by mouth daily.  05/23/13  Yes Historical Provider, MD  LUTEIN PO Take 1 tablet by mouth daily.   Yes Historical Provider, MD  metoprolol (LOPRESSOR) 50 MG tablet Take 50 mg by mouth 2 (two) times daily.    Yes Historical Provider, MD  nitroGLYCERIN (NITROSTAT) 0.4 MG SL tablet Place 0.4 mg under the tongue every 5 (five) minutes as needed.      Yes Historical Provider, MD  polyethylene glycol (MIRALAX / GLYCOLAX) packet Take 17 g by mouth daily as needed for mild constipation.    Yes Historical Provider, MD  simvastatin (ZOCOR) 40 MG tablet Take 20 mg by mouth daily at 6 PM.   Yes Historical Provider, MD  warfarin (COUMADIN) 2.5 MG tablet Take 2.5 mg by mouth daily.    Yes Historical Provider, MD  metFORMIN (GLUMETZA) 1000 MG (MOD) 24 hr tablet Take 500 mg by mouth 2 (two) times daily with a meal.     Historical Provider, MD    Physical Exam: Filed Vitals:   09/16/14 1531 09/16/14 1727 09/16/14 2039 09/16/14 2307  BP: 154/66 184/72 158/58 132/52  Pulse: 57 68 65 72  Temp: 97.9 F (36.6 C)     TempSrc: Oral     Resp: 18 16 18 18   SpO2: 96% 96% 97% 94%    General: Alert, Awake and Oriented to Time, Place and Person. Appear in mild distress Eyes: PERRL ENT: Oral Mucosa clear moist. Neck: no JVD Cardiovascular: S1 and S2 Present, no Murmur, Peripheral Pulses Present Respiratory: Bilateral Air entry equal and Decreased,  Clear to Auscultation, no Crackles, no wheezes Abdomen: Bowel Sound present, Soft and non tender Skin: no Rash Extremities: Bilateral Pedal edema, no calf tenderness Neurologic: Grossly no focal neuro deficit.  Labs on Admission:  CBC:  Recent Labs Lab 09/16/14 1706  WBC 10.7*  NEUTROABS 6.8  HGB 14.3  HCT 43.4  MCV 94.8  PLT 162    CMP     Component Value Date/Time   NA 138 09/16/2014 1706   K 3.9 09/16/2014 1706   CL 102 09/16/2014 1706   CO2 26 09/16/2014 1706   GLUCOSE 85 09/16/2014 1706   BUN 28* 09/16/2014 1706   CREATININE 1.17* 09/16/2014 1706   CALCIUM 9.1 09/16/2014 1706   PROT 6.6 08/14/2009 1507   ALBUMIN 3.6 08/14/2009 1507   AST 41* 08/14/2009 1507   ALT 96* 08/14/2009 1507   ALKPHOS 53 08/14/2009 1507   BILITOT 0.6 08/14/2009 1507   GFRNONAA 43* 09/16/2014 1706   GFRAA 50* 09/16/2014 1706    No results for input(s): LIPASE, AMYLASE in the last 168 hours.  No  results for input(s): CKTOTAL, CKMB, CKMBINDEX, TROPONINI in the last 168 hours. BNP (last 3 results) No results for input(s): BNP in the last 8760 hours.  ProBNP (last 3 results) No results for input(s): PROBNP in the last 8760 hours.   Radiological Exams on Admission: Ct Head Wo Contrast  09/16/2014   CLINICAL DATA:  Initial encounter for fall without head trauma. History of atrial fibrillation. Diabetes.  EXAM: CT HEAD WITHOUT CONTRAST  CT CERVICAL SPINE WITHOUT CONTRAST  TECHNIQUE: Multidetector CT imaging of the head and cervical spine was performed following the standard  protocol without intravenous contrast. Multiplanar CT image reconstructions of the cervical spine were also generated.  COMPARISON:  Head CT of 08/12/2009. No prior cervical spine imaging.  FINDINGS: CT HEAD FINDINGS  Sinuses/Soft tissues: No significant soft tissue swelling. Hyperostosis frontalis interna. Clear paranasal sinuses and mastoid air cells. No skull fracture.  Intracranial: Expected cerebral volume loss for age. Mild low density in the periventricular white matter likely related to small vessel disease. No mass lesion, hemorrhage, hydrocephalus, acute infarct, intra-axial, or extra-axial fluid collection.  CT CERVICAL SPINE FINDINGS  Spinal visualization through the mid T1 level. Prevertebral soft tissues are within normal limits. Subcentimeter left thyroid nodule which is nonspecific. No apical pneumothorax. Marked spondylosis. Prominent osteophytes at C4 through C7. At C4-5, right neural foraminal and central canal stenosis. At C5-6, there is severe central canal and bilateral neural foraminal narrowing. Straightening of expected lordosis with maintenance of vertebral body height. Heterogeneous osteopenia. Advanced facet arthropathy at multiple levels, most marked in the lower cervical spine. Coronal reformats demonstrate only degenerative change involving the C1-2 articulation.  IMPRESSION: 1.  No acute intracranial  abnormality. 2.  Cerebral atrophy and small vessel ischemic change. 3. Advanced cervical spondylosis, without acute fracture or subluxation. This results in central canal and neural foraminal narrowing, which could predispose the patient to cord injury in the setting of trauma. 4. Straightening of expected cervical lordosis could be positional, due to muscular spasm, or ligamentous injury.   Electronically Signed   By: Abigail Miyamoto M.D.   On: 09/16/2014 17:33   Ct Cervical Spine Wo Contrast  09/16/2014   CLINICAL DATA:  Initial encounter for fall without head trauma. History of atrial fibrillation. Diabetes.  EXAM: CT HEAD WITHOUT CONTRAST  CT CERVICAL SPINE WITHOUT CONTRAST  TECHNIQUE: Multidetector CT imaging of the head and cervical spine was performed following the standard protocol without intravenous contrast. Multiplanar CT image reconstructions of the cervical spine were also generated.  COMPARISON:  Head CT of 08/12/2009. No prior cervical spine imaging.  FINDINGS: CT HEAD FINDINGS  Sinuses/Soft tissues: No significant soft tissue swelling. Hyperostosis frontalis interna. Clear paranasal sinuses and mastoid air cells. No skull fracture.  Intracranial: Expected cerebral volume loss for age. Mild low density in the periventricular white matter likely related to small vessel disease. No mass lesion, hemorrhage, hydrocephalus, acute infarct, intra-axial, or extra-axial fluid collection.  CT CERVICAL SPINE FINDINGS  Spinal visualization through the mid T1 level. Prevertebral soft tissues are within normal limits. Subcentimeter left thyroid nodule which is nonspecific. No apical pneumothorax. Marked spondylosis. Prominent osteophytes at C4 through C7. At C4-5, right neural foraminal and central canal stenosis. At C5-6, there is severe central canal and bilateral neural foraminal narrowing. Straightening of expected lordosis with maintenance of vertebral body height. Heterogeneous osteopenia. Advanced facet  arthropathy at multiple levels, most marked in the lower cervical spine. Coronal reformats demonstrate only degenerative change involving the C1-2 articulation.  IMPRESSION: 1.  No acute intracranial abnormality. 2.  Cerebral atrophy and small vessel ischemic change. 3. Advanced cervical spondylosis, without acute fracture or subluxation. This results in central canal and neural foraminal narrowing, which could predispose the patient to cord injury in the setting of trauma. 4. Straightening of expected cervical lordosis could be positional, due to muscular spasm, or ligamentous injury.   Electronically Signed   By: Abigail Miyamoto M.D.   On: 09/16/2014 17:33   Dg Hips Bilat With Pelvis 3-4 Views  09/16/2014   CLINICAL DATA:  [place] through the stairs today at  her sisters house. Bilateral hip pain, right worse than left.  EXAM: BILATERAL HIP (WITH PELVIS) 3-4 VIEWS  COMPARISON:  None.  FINDINGS: No evidence of pelvic fracture. Left hip shows mild osteoarthritis with marginal osteophytes. The right hip shows more advanced osteoarthritis with more extensive marginal osteophytes. There is also calcification between the femur and the ischium probably related to old injury with heterotopic change.  IMPRESSION: No acute traumatic finding. Osteoarthritis of both hips. Heterotopic calcification between the right femur in the ischium probably related to old injury.   Electronically Signed   By: Nelson Chimes M.D.   On: 09/16/2014 17:55   Assessment/Plan Principal Problem:   Sacral fracture, closed Active Problems:   Atrial fibrillation   Diabetes mellitus without complication   Hypertension   Pelvic fracture   Warfarin anticoagulation   1. Sacral fracture, closed The patient is presenting with a fall. The fall appears to be mechanical. CT of the head and C-spine are unremarkable. X-ray of the pelvis was not showing any acute abnormality although an MRI due to persistent pain was performed which is showing  evidence of pubic rami fracture as well as a right-sided sacral fracture without any evidence of SI joint widening. Patient's examination does not reveal any acute abnormality neurologically. She complains of severe pain in the right groin. With this orthopedic has been consulted who recommends conservative management. The patient will be admitted in med surge unit. When necessary pain management with OxyIR. Mobile regimen. Patient has seen Dr. Louanne Skye in the past,  Bedrest until he was evaluated by orthopedics again in the morning. Physical therapy and occupational therapy consultation after that. Bother regimen as well as as needed in and out cath.  2. History of hypertension. History of A. fib. Warfarin use. Continuing medications with Lopressor. Checking EKG reviewed In view of injury noted to muscular structures in the pelvis I'm holding off on warfarin dosing for tomorrow morning as well as holding aspirin. Medication can be resumed when her H&H remains to be stable.  3. History of diabetes mellitus. Checking hemoglobin A1c. Placing a sliding scale. Holding metformin.  4. History of chronic kidney disease. Serum creatinine appears to be stable. Avoid nephrotoxic medication and monitor daily weight.  Advance goals of care discussion: Partial code. Patient's niece will be her power of attorney.   Consults: ED discussed with orthopedics.  DVT Prophylaxis: mechanical compression device and on chronic therapeutic anticoagulation. Nutrition: Diabetic  Family Communication: No family was available at bedside Disposition: Admitted as observation, med-surge unit.  Author: Berle Mull, MD [facility] Pager: [phone] 09/17/2014  If 7PM-7AM, please contact night-coverage [email or web address] Password [email or web address]

## 2014-09-17 NOTE — Progress Notes (Signed)
PT Cancellation Note  Patient Details Name: GEANA WALTS MRN: [id number] DOB: 11-21-1931   Cancelled Treatment:    Reason Eval/Treat Not Completed: Medical issues which prohibited therapy. Pt currently on bedrest-awaiting ortho consult. Will hold PT until ortho makes recommendations (activity level, WB status).   Weston Anna [facility] 09/17/2014, 10:54 AM

## 2014-09-17 NOTE — Progress Notes (Signed)
[facility] PROGRESS NOTE  Janice [name] WUJ:[id number] DOB: [birth year] DOA: 09/16/2014 PCP: Vidal Schwalbe, MD  Brief narrative 81 y.o. female with Past medical history of diabetes mellitus, hypertension, dyslipidemia, GERD, chronic lymphedema, atrial fibrillation on warfarin admitted after a mechanical fall and landing on her right hip. Patient sustained a right sacral ala fracture with right superior and inferior pubic ramus fracture. MRI of the pelvis also showed rupture of iliopsoas tendon with proximal retraction.   Assessment/Plan: Closed right sacral fracture / right superior and inf pubic ramus fracture with iliopsoas tendon rupture Admit to telemetry Pain control with prn percocet and robaxin.  [facility] [employer]. (Spoke with Biagio Borg PA) Add bowel regimen.  Essential HTN Continue amlodipine, metoprolol and Avapro  Type 2 diabetes mellitus Monitor with sliding scale insulin. Continue Avapro  Dyslipidemia Continue statin  A. fib Rate controlled. Continue beta blocker. Hold Coumadin until orthopedics evaluation..  Diet: Nothing by mouth  DVT prophylaxis: SCD. Resume coumadin once seen by ortho and no intervention recommended  Code Status: CARDIAC RESUSCITATION, DO NOT INTUBATE. Family Communication: sister at bedside Disposition Plan: PT and ortho eval pending. Patient lives alone. May benefit from skilled nursing facility.   Consultants:  [employer] (pending)  Procedures:  MRI pelvis  Antibiotics:  none  HPI/Subjective: Seen and examined. Reports pain on rt hip with minimal movement  Objective: Filed Vitals:   09/17/14 0732  BP: 149/58  Pulse: 60  Temp: 98 F (36.7 C)  Resp: 18    Intake/Output Summary (Last 24 hours) at 09/17/14 1320 Last data filed at 09/17/14 1111  Gross per 24 hour  Intake    240 ml  Output    100 ml  Net    140 ml   Filed Weights   09/17/14 0900  Weight: 81.421 kg (179 lb 8 oz)     Exam:   General:  Elderly female in no acute distress  HEENT: No pallor, moist oral mucosa, supple neck  Chest: Clear to auscultation bilaterally  CVS: S1 and S2 irregular, no murmurs rub or gallop  GI: Soft, nondistended, nontender, bowel sounds present  Musculoskeletal: Warm, limited out OM off right hip due to pain,  CNS: Alert and oriented  Data Reviewed: Basic Metabolic Panel:  Recent Labs Lab 09/16/14 1706 09/17/14 0520  NA 138 139  K 3.9 4.1  CL 102 104  CO2 26 27  GLUCOSE 85 110*  BUN 28* 25*  CREATININE 1.17* 1.15*  CALCIUM 9.1 8.9   Liver Function Tests:  Recent Labs Lab 09/17/14 0520  AST 26  ALT 17  ALKPHOS 102  BILITOT 1.0  PROT 6.2*  ALBUMIN 3.4*   No results for input(s): LIPASE, AMYLASE in the last 168 hours. No results for input(s): AMMONIA in the last 168 hours. CBC:  Recent Labs Lab 09/16/14 1706 09/17/14 0520  WBC 10.7* 7.8  NEUTROABS 6.8 4.7  HGB 14.3 12.7  HCT 43.4 38.9  MCV 94.8 93.7  PLT 162 133*   Cardiac Enzymes: No results for input(s): CKTOTAL, CKMB, CKMBINDEX, TROPONINI in the last 168 hours. BNP (last 3 results) No results for input(s): BNP in the last 8760 hours.  ProBNP (last 3 results) No results for input(s): PROBNP in the last 8760 hours.  CBG:  Recent Labs Lab 09/17/14 0120 09/17/14 0725 09/17/14 1136  GLUCAP 170* 97 106*    No results found for this or any previous visit (from the past 240 hour(s)).   Studies: Ct Head Wo Contrast  09/16/2014   CLINICAL DATA:  Initial encounter for fall without head trauma. History of atrial fibrillation. Diabetes.  EXAM: CT HEAD WITHOUT CONTRAST  CT CERVICAL SPINE WITHOUT CONTRAST  TECHNIQUE: Multidetector CT imaging of the head and cervical spine was performed following the standard protocol without intravenous contrast. Multiplanar CT image reconstructions of the cervical spine were also generated.  COMPARISON:  Head CT of 08/12/2009. No prior cervical spine  imaging.  FINDINGS: CT HEAD FINDINGS  Sinuses/Soft tissues: No significant soft tissue swelling. Hyperostosis frontalis interna. Clear paranasal sinuses and mastoid air cells. No skull fracture.  Intracranial: Expected cerebral volume loss for age. Mild low density in the periventricular white matter likely related to small vessel disease. No mass lesion, hemorrhage, hydrocephalus, acute infarct, intra-axial, or extra-axial fluid collection.  CT CERVICAL SPINE FINDINGS  Spinal visualization through the mid T1 level. Prevertebral soft tissues are within normal limits. Subcentimeter left thyroid nodule which is nonspecific. No apical pneumothorax. Marked spondylosis. Prominent osteophytes at C4 through C7. At C4-5, right neural foraminal and central canal stenosis. At C5-6, there is severe central canal and bilateral neural foraminal narrowing. Straightening of expected lordosis with maintenance of vertebral body height. Heterogeneous osteopenia. Advanced facet arthropathy at multiple levels, most marked in the lower cervical spine. Coronal reformats demonstrate only degenerative change involving the C1-2 articulation.  IMPRESSION: 1.  No acute intracranial abnormality. 2.  Cerebral atrophy and small vessel ischemic change. 3. Advanced cervical spondylosis, without acute fracture or subluxation. This results in central canal and neural foraminal narrowing, which could predispose the patient to cord injury in the setting of trauma. 4. Straightening of expected cervical lordosis could be positional, due to muscular spasm, or ligamentous injury.   Electronically Signed   By: Abigail Miyamoto M.D.   On: 09/16/2014 17:33   Ct Cervical Spine Wo Contrast  09/16/2014   CLINICAL DATA:  Initial encounter for fall without head trauma. History of atrial fibrillation. Diabetes.  EXAM: CT HEAD WITHOUT CONTRAST  CT CERVICAL SPINE WITHOUT CONTRAST  TECHNIQUE: Multidetector CT imaging of the head and cervical spine was performed  following the standard protocol without intravenous contrast. Multiplanar CT image reconstructions of the cervical spine were also generated.  COMPARISON:  Head CT of 08/12/2009. No prior cervical spine imaging.  FINDINGS: CT HEAD FINDINGS  Sinuses/Soft tissues: No significant soft tissue swelling. Hyperostosis frontalis interna. Clear paranasal sinuses and mastoid air cells. No skull fracture.  Intracranial: Expected cerebral volume loss for age. Mild low density in the periventricular white matter likely related to small vessel disease. No mass lesion, hemorrhage, hydrocephalus, acute infarct, intra-axial, or extra-axial fluid collection.  CT CERVICAL SPINE FINDINGS  Spinal visualization through the mid T1 level. Prevertebral soft tissues are within normal limits. Subcentimeter left thyroid nodule which is nonspecific. No apical pneumothorax. Marked spondylosis. Prominent osteophytes at C4 through C7. At C4-5, right neural foraminal and central canal stenosis. At C5-6, there is severe central canal and bilateral neural foraminal narrowing. Straightening of expected lordosis with maintenance of vertebral body height. Heterogeneous osteopenia. Advanced facet arthropathy at multiple levels, most marked in the lower cervical spine. Coronal reformats demonstrate only degenerative change involving the C1-2 articulation.  IMPRESSION: 1.  No acute intracranial abnormality. 2.  Cerebral atrophy and small vessel ischemic change. 3. Advanced cervical spondylosis, without acute fracture or subluxation. This results in central canal and neural foraminal narrowing, which could predispose the patient to cord injury in the setting of trauma. 4. Straightening of expected cervical lordosis could be  positional, due to muscular spasm, or ligamentous injury.   Electronically Signed   By: Abigail Miyamoto M.D.   On: 09/16/2014 17:33   Mr Hip Right Wo Contrast  09/17/2014   CLINICAL DATA:  Right hip pain since a fall on stairs that gave  way.  EXAM: MR OF THE RIGHT HIP WITHOUT CONTRAST  TECHNIQUE: Multiplanar, multisequence MR imaging was performed. No intravenous contrast was administered.  COMPARISON:  Radiographs dated 09/16/2014  FINDINGS: Bones: There is a vertical fracture through the right sacral ala. There also a nondisplaced fractures of right superior pubic ramus at the pubic body and of the right inferior pubic ramus. There is disruption of the distal right iliopsoas tendon with 3.5 cm of proximal retraction. There is also edema in the right external obturator muscle.  Soft tissue contusion in the subcutaneous fat lateral to the left hip.  There is a mild to moderate osteoarthritis of both hips. No joint effusions. No bursitis. No adenopathy or mass lesions.  IMPRESSION: 1. Fractures of the right sacral ala and of the right inferior and superior pubic rami. 2. Rupture of the distal iliopsoas tendon with proximal retraction. 3. Strain of the right external obturator muscle.   Electronically Signed   By: Lorriane Shire M.D.   On: 09/17/2014 08:04   Dg Hips Bilat With Pelvis 3-4 Views  09/16/2014   CLINICAL DATA:  [place] through the stairs today at her sisters house. Bilateral hip pain, right worse than left.  EXAM: BILATERAL HIP (WITH PELVIS) 3-4 VIEWS  COMPARISON:  None.  FINDINGS: No evidence of pelvic fracture. Left hip shows mild osteoarthritis with marginal osteophytes. The right hip shows more advanced osteoarthritis with more extensive marginal osteophytes. There is also calcification between the femur and the ischium probably related to old injury with heterotopic change.  IMPRESSION: No acute traumatic finding. Osteoarthritis of both hips. Heterotopic calcification between the right femur in the ischium probably related to old injury.   Electronically Signed   By: Nelson Chimes M.D.   On: 09/16/2014 17:55    Scheduled Meds: . amLODipine  2.5 mg Oral Daily  . aspirin  81 mg Oral Daily  . insulin aspart  0-5 Units Subcutaneous  QHS  . insulin aspart  0-9 Units Subcutaneous TID WC  . irbesartan  300 mg Oral Daily  . [START ON 09/18/2014] metoprolol  50 mg Oral BID  . senna  1 tablet Oral BID  . simvastatin  20 mg Oral q1800   Continuous Infusions:     Time spent: 25 minutes    Sherman Donaldson, [place]  [facility] Pager [phone] If 7PM-7AM, please contact night-coverage at [email or web address], password [email or web address] 09/17/2014, 1:20 PM

## 2014-09-17 NOTE — Progress Notes (Signed)
OT Cancellation Note  Patient Details Name: Janice [name] MRN: [id number] DOB: Dec 25, 1931   Cancelled Treatment:    Reason Eval/Treat Not Completed: Medical issues which prohibited therapy. Pt remains on bedrest, awaiting orthopedics; will check back  Kieth Hartis 09/17/2014, 10:27 AM  Lesle Chris, OTR/L [phone] 09/17/2014

## 2014-09-17 NOTE — Consult Note (Signed)
Joni Fears, MD   Biagio Borg, PA-C [address] [place], [place]  [address]                             [phone]   ORTHOPAEDIC CONSULTATION  EVITA MERIDA            MRN:  [id number] DOB/SEX:  1931-12-15/female    REQUESTING PHYSICIAN:  [facility]  CHIEF COMPLAINT:  Painful Right buttocks and groin  HISTORY: Krisandra A Phillipsis a 81 y.o. female with a history of a fall when she was descending the stairs out of a trailer with her grandson and suddenly the stairs broke. Not sure of how she fell but landed on her buttocks.  Brought to the [facility] where xrays were performed and an MRI completed which showed a right sacral ala fracture as well as superior and inferior pubic ramus fracture primarily non displaced and not readily seen on plain films but on the MRI.  Denies any numbness or radicular pain at this time.  Asked to consult for this patient.  PAST MEDICAL HISTORY: Patient Active Problem List   Diagnosis Date Noted  . Pelvic fracture 09/17/2014  . Sacral fracture, closed 09/17/2014  . Warfarin anticoagulation 09/17/2014  . Warfarin-induced coagulopathy 06/23/2013  . Renal insufficiency 06/23/2013  . Chest pain 06/22/2013  . Other dysphagia 07/24/2012  . Atrial fibrillation   . Diabetes mellitus without complication   . Hypertension   . Hyperlipidemia   . Chest pain    Past Medical History  Diagnosis Date  . Atrial fibrillation   . Diabetes mellitus   . Hypertension   . Hyperlipidemia   . Chest pain   . Allergic rhinitis   . Obesity   . Osteoarthritis   . Cataracts, bilateral   . Endometrial cancer   . GERD (gastroesophageal reflux disease)   . CKD (chronic kidney disease)   . Lymphedema    Past Surgical History  Procedure Laterality Date  . Cardiac catheterization  06/2009    REVEALED SMOOTH AND NORMAL CORONARY ARTERIES  . Back surgery    . Cholecystectomy    . Vaginal hysterectomy    . Appendectomy      MEDICATIONS:   Current  facility-administered medications:  .  ALPRAZolam (XANAX) tablet 0.25 mg, 0.25 mg, Oral, Daily PRN, Lavina Hamman, MD .  amLODipine (NORVASC) tablet 2.5 mg, 2.5 mg, Oral, Daily, Lavina Hamman, MD, 2.5 mg at 09/17/14 0910 .  aspirin chewable tablet 81 mg, 81 mg, Oral, Daily, Lavina Hamman, MD, 81 mg at 09/17/14 1000 .  [START ON 09/18/2014] calcitonin (salmon) (MIACALCIN/FORTICAL) nasal spray 1 spray, 1 spray, Alternating Nares, Daily, Jessy Oto, MD .  insulin aspart (novoLOG) injection 0-5 Units, 0-5 Units, Subcutaneous, QHS, Lavina Hamman, MD, 0 Units at 09/17/14 0126 .  insulin aspart (novoLOG) injection 0-9 Units, 0-9 Units, Subcutaneous, TID WC, Lavina Hamman, MD, 0 Units at 09/17/14 0800 .  irbesartan (AVAPRO) tablet 300 mg, 300 mg, Oral, Daily, Lavina Hamman, MD, 300 mg at 09/17/14 0910 .  methocarbamol (ROBAXIN) tablet 500 mg, 500 mg, Oral, Q6H PRN, 500 mg at 09/17/14 2117 **OR** methocarbamol (ROBAXIN) 500 mg in dextrose 5 % 50 mL IVPB, 500 mg, Intravenous, Q6H PRN, Lavina Hamman, MD .  Derrill Memo ON 09/18/2014] metoprolol tartrate (LOPRESSOR) tablet 50 mg, 50 mg, Oral, BID, Lavina Hamman, MD .  oxyCODONE (Oxy IR/ROXICODONE) immediate release tablet  5 mg, 5 mg, Oral, Q4H PRN, Lavina Hamman, MD, 5 mg at 09/17/14 1941 .  polyethylene glycol (MIRALAX / GLYCOLAX) packet 17 g, 17 g, Oral, Daily PRN, Lavina Hamman, MD .  senna [facility]) tablet 8.6 mg, 1 tablet, Oral, BID, Lavina Hamman, MD, 8.6 mg at 09/17/14 2117 .  simvastatin (ZOCOR) tablet 20 mg, 20 mg, Oral, q1800, Lavina Hamman, MD, 20 mg at 09/17/14 1706  ALLERGIES:   Allergies  Allergen Reactions  . Demerol [Meperidine] Nausea Only    And vertigo  . Meperidine Hcl     REACTION: nausea and vomiting    REVIEW OF SYSTEMS: REVIEWED IN DETAIL IN CHART  FAMILY HISTORY:   Family History  Problem Relation Age of Onset  . Colon cancer Mother   . Kidney cancer Father   . Lung cancer Sister   . Colon cancer Sister     x2     SOCIAL HISTORY:   reports that she has never smoked. She has never used smokeless tobacco. She reports that she does not drink alcohol or use illicit drugs.   EXAMINATION: Vital signs in last 24 hours: Temp:  [97.5 F (36.4 C)-99 F (37.2 C)] 97.8 F (36.6 C) (07/05 2328) Pulse Rate:  [60-71] 67 (07/05 2328) Resp:  [16-20] 17 (07/05 2328) BP: (119-155)/(40-60) 131/54 mmHg (07/05 2328) SpO2:  [94 %-100 %] 95 % (07/05 2328) Weight:  [81.421 kg (179 lb 8 oz)] 81.421 kg (179 lb 8 oz) (07/05 0900)  General appearance: alert, cooperative, mild distress and moderate distress Head: Normocephalic, without obvious abnormality Eyes: negative findings: pupils equal, round, reactive to light and accomodation Ears: normal TM's and external ear canals both ears Nose: Nares normal. Septum midline. Mucosa normal. No drainage or sinus tenderness. Throat: lips, mucosa, and tongue normal; teeth and gums normal Extremities: extremities normal, atraumatic, no cyanosis or edema Pulses: 2+ and symmetric Skin: Skin color, texture, turgor normal. No rashes or lesions  Musculoskeletal Exam  :Tender to palpation of the pubic rami on the right as well as of the right sacrum.  Good strength and sensation bilateral LE.  Some pain in the groin with IR and ER of right hip.   DIAGNOSTIC STUDIES: Recent laboratory studies:  Recent Labs  09/16/14 1706 09/17/14 0520  WBC 10.7* 7.8  HGB 14.3 12.7  HCT 43.4 38.9  PLT 162 133*    Recent Labs  09/16/14 1706 09/17/14 0520  NA 138 139  K 3.9 4.1  CL 102 104  CO2 26 27  BUN 28* 25*  CREATININE 1.17* 1.15*  GLUCOSE 85 110*  CALCIUM 9.1 8.9   Lab Results  Component Value Date   INR 1.78* 09/17/2014   INR 1.77* 09/16/2014   INR 1.93* 06/23/2013     Recent Radiographic Studies :  Ct Head Wo Contrast  09/16/2014   CLINICAL DATA:  Initial encounter for fall without head trauma. History of atrial fibrillation. Diabetes.  EXAM: CT HEAD WITHOUT CONTRAST   CT CERVICAL SPINE WITHOUT CONTRAST  TECHNIQUE: Multidetector CT imaging of the head and cervical spine was performed following the standard protocol without intravenous contrast. Multiplanar CT image reconstructions of the cervical spine were also generated.  COMPARISON:  Head CT of 08/12/2009. No prior cervical spine imaging.  FINDINGS: CT HEAD FINDINGS  Sinuses/Soft tissues: No significant soft tissue swelling. Hyperostosis frontalis interna. Clear paranasal sinuses and mastoid air cells. No skull fracture.  Intracranial: Expected cerebral volume loss for age. Mild low density in  the periventricular white matter likely related to small vessel disease. No mass lesion, hemorrhage, hydrocephalus, acute infarct, intra-axial, or extra-axial fluid collection.  CT CERVICAL SPINE FINDINGS  Spinal visualization through the mid T1 level. Prevertebral soft tissues are within normal limits. Subcentimeter left thyroid nodule which is nonspecific. No apical pneumothorax. Marked spondylosis. Prominent osteophytes at C4 through C7. At C4-5, right neural foraminal and central canal stenosis. At C5-6, there is severe central canal and bilateral neural foraminal narrowing. Straightening of expected lordosis with maintenance of vertebral body height. Heterogeneous osteopenia. Advanced facet arthropathy at multiple levels, most marked in the lower cervical spine. Coronal reformats demonstrate only degenerative change involving the C1-2 articulation.  IMPRESSION: 1.  No acute intracranial abnormality. 2.  Cerebral atrophy and small vessel ischemic change. 3. Advanced cervical spondylosis, without acute fracture or subluxation. This results in central canal and neural foraminal narrowing, which could predispose the patient to cord injury in the setting of trauma. 4. Straightening of expected cervical lordosis could be positional, due to muscular spasm, or ligamentous injury.   Electronically Signed   By: Abigail Miyamoto M.D.   On:  09/16/2014 17:33   Ct Cervical Spine Wo Contrast  09/16/2014   CLINICAL DATA:  Initial encounter for fall without head trauma. History of atrial fibrillation. Diabetes.  EXAM: CT HEAD WITHOUT CONTRAST  CT CERVICAL SPINE WITHOUT CONTRAST  TECHNIQUE: Multidetector CT imaging of the head and cervical spine was performed following the standard protocol without intravenous contrast. Multiplanar CT image reconstructions of the cervical spine were also generated.  COMPARISON:  Head CT of 08/12/2009. No prior cervical spine imaging.  FINDINGS: CT HEAD FINDINGS  Sinuses/Soft tissues: No significant soft tissue swelling. Hyperostosis frontalis interna. Clear paranasal sinuses and mastoid air cells. No skull fracture.  Intracranial: Expected cerebral volume loss for age. Mild low density in the periventricular white matter likely related to small vessel disease. No mass lesion, hemorrhage, hydrocephalus, acute infarct, intra-axial, or extra-axial fluid collection.  CT CERVICAL SPINE FINDINGS  Spinal visualization through the mid T1 level. Prevertebral soft tissues are within normal limits. Subcentimeter left thyroid nodule which is nonspecific. No apical pneumothorax. Marked spondylosis. Prominent osteophytes at C4 through C7. At C4-5, right neural foraminal and central canal stenosis. At C5-6, there is severe central canal and bilateral neural foraminal narrowing. Straightening of expected lordosis with maintenance of vertebral body height. Heterogeneous osteopenia. Advanced facet arthropathy at multiple levels, most marked in the lower cervical spine. Coronal reformats demonstrate only degenerative change involving the C1-2 articulation.  IMPRESSION: 1.  No acute intracranial abnormality. 2.  Cerebral atrophy and small vessel ischemic change. 3. Advanced cervical spondylosis, without acute fracture or subluxation. This results in central canal and neural foraminal narrowing, which could predispose the patient to cord  injury in the setting of trauma. 4. Straightening of expected cervical lordosis could be positional, due to muscular spasm, or ligamentous injury.   Electronically Signed   By: Abigail Miyamoto M.D.   On: 09/16/2014 17:33   Mr Hip Right Wo Contrast  09/17/2014   CLINICAL DATA:  Right hip pain since a fall on stairs that gave way.  EXAM: MR OF THE RIGHT HIP WITHOUT CONTRAST  TECHNIQUE: Multiplanar, multisequence MR imaging was performed. No intravenous contrast was administered.  COMPARISON:  Radiographs dated 09/16/2014  FINDINGS: Bones: There is a vertical fracture through the right sacral ala. There also a nondisplaced fractures of right superior pubic ramus at the pubic body and of the right inferior pubic ramus.  There is disruption of the distal right iliopsoas tendon with 3.5 cm of proximal retraction. There is also edema in the right external obturator muscle.  Soft tissue contusion in the subcutaneous fat lateral to the left hip.  There is a mild to moderate osteoarthritis of both hips. No joint effusions. No bursitis. No adenopathy or mass lesions.  IMPRESSION: 1. Fractures of the right sacral ala and of the right inferior and superior pubic rami. 2. Rupture of the distal iliopsoas tendon with proximal retraction. 3. Strain of the right external obturator muscle.   Electronically Signed   By: Lorriane Shire M.D.   On: 09/17/2014 08:04   Dg Hips Bilat With Pelvis 3-4 Views  09/16/2014   CLINICAL DATA:  [place] through the stairs today at her sisters house. Bilateral hip pain, right worse than left.  EXAM: BILATERAL HIP (WITH PELVIS) 3-4 VIEWS  COMPARISON:  None.  FINDINGS: No evidence of pelvic fracture. Left hip shows mild osteoarthritis with marginal osteophytes. The right hip shows more advanced osteoarthritis with more extensive marginal osteophytes. There is also calcification between the femur and the ischium probably related to old injury with heterotopic change.  IMPRESSION: No acute traumatic finding.  Osteoarthritis of both hips. Heterotopic calcification between the right femur in the ischium probably related to old injury.   Electronically Signed   By: Nelson Chimes M.D.   On: 09/16/2014 17:55    ASSESSMENT: nondisplaced right sacral ala fracture with superior and inferior rami fractures- nondisplaced   PLAN: Bed to chair WB on left leg  PT for ambulation TDWB on right with walker if tolerated  Follow up in office in 2 weeks with Dr Durward Fortes  [facility] 09/17/2014, 2:05 PM

## 2014-09-17 NOTE — Consult Note (Signed)
Reason for Consult:Right pelvis fractures Referring Physician:Dhengel,MD Consulting Physician:NITKA,JAMES E  Orthopedic Diagnosis:1)Nondisplaced right superior and inferior pubic rami fractures, nondisplaced right vertical sacral fracture.    2)Right iliopsoas tendon rupture off the right lesser trochanter with 3 cm of retraction.    3)Minimal renal insufficiency, Cr 1.15, GFR 47    4)Atrial fibrillation with chronic coumadinization.    5)Diabetes mellitus  Janice [name] is an 81 y.o. female.Fell yesterday afternoon while walking down stairs at her sister's house,landing on concrete driveway. She fell onto her right hip. Unable to stand or move the right leg she was transported to [place] were negative and  MRI of her right hip showed nondisplaced right vertical sacral fracture and nondisplaced right superior and inferior pubic rami fractures. The right Iliopsoas tendon was also seen to be ruptured from the right lesser trochanter and displaced proximally 3 cm. She is unable to bear weight on the right leg, unable to flex the right hip.Presently at bedrest, receiving narcotic medications. Voiding frequently using a fracture pan.   Past Medical History  Diagnosis Date  . Atrial fibrillation   . Diabetes mellitus   . Hypertension   . Hyperlipidemia   . Chest pain   . Allergic rhinitis   . Obesity   . Osteoarthritis   . Cataracts, bilateral   . Endometrial cancer   . GERD (gastroesophageal reflux disease)   . CKD (chronic kidney disease)   . Lymphedema     Past Surgical History  Procedure Laterality Date  . Cardiac catheterization  06/2009    REVEALED SMOOTH AND NORMAL CORONARY ARTERIES  . Back surgery    . Cholecystectomy    . Vaginal hysterectomy    . Appendectomy      Family History  Problem Relation Age of Onset  . Colon cancer Mother   . Kidney cancer Father   . Lung cancer Sister   . Colon cancer Sister     x2    Social  History:  reports that she has never smoked. She has never used smokeless tobacco. She reports that she does not drink alcohol or use illicit drugs.  Allergies:  Allergies  Allergen Reactions  . Demerol [Meperidine] Nausea Only    And vertigo  . Meperidine Hcl     REACTION: nausea and vomiting    Medications:  Prior to Admission:  Prescriptions prior to admission  Medication Sig Dispense Refill Last Dose  . ALPRAZolam (XANAX) 0.25 MG tablet Take 0.25 mg by mouth daily as needed for anxiety.    09/16/2014 at Unknown time  . amLODipine (NORVASC) 2.5 MG tablet Take 2.5 mg by mouth daily.   09/16/2014 at Unknown time  . aspirin 81 MG tablet Take 81 mg by mouth daily.     09/16/2014 at Unknown time  . Cyanocobalamin (VITAMIN B-12) 500 MCG SUBL Place 1 tablet under the tongue daily.   09/16/2014 at Unknown time  . HYDROcodone-acetaminophen (NORCO/VICODIN) 5-325 MG per tablet Take 0.5 tablets by mouth every 6 (six) hours as needed for moderate pain.   unknown  . irbesartan (AVAPRO) 300 MG tablet Take 300 mg by mouth daily.    09/16/2014 at Unknown time  . LUTEIN PO Take 1 tablet by mouth daily.   09/16/2014 at Unknown time  . metoprolol (LOPRESSOR) 50 MG tablet Take 50 mg by mouth 2 (two) times daily.    09/16/2014 at 0830  . nitroGLYCERIN (NITROSTAT) 0.4 MG SL tablet Place 0.4 mg  under the tongue every 5 (five) minutes as needed.     unknown  . polyethylene glycol (MIRALAX / GLYCOLAX) packet Take 17 g by mouth daily as needed for mild constipation.    09/16/2014 at Unknown time  . simvastatin (ZOCOR) 40 MG tablet Take 20 mg by mouth daily at 6 PM.   09/16/2014 at Unknown time  . warfarin (COUMADIN) 2.5 MG tablet Take 2.5 mg by mouth daily.    09/16/2014 at 0800   Scheduled: . amLODipine  2.5 mg Oral Daily  . aspirin  81 mg Oral Daily  . insulin aspart  0-5 Units Subcutaneous QHS  . insulin aspart  0-9 Units Subcutaneous TID WC  . irbesartan  300 mg Oral Daily  . [START ON 09/18/2014] metoprolol  50 mg Oral BID   . senna  1 tablet Oral BID  . simvastatin  20 mg Oral q1800   LYY:[id number], methocarbamol **OR** methocarbamol (ROBAXIN)  IV, oxyCODONE, polyethylene glycol  Results for orders placed or performed during the hospital encounter of 09/16/14 (from the past 48 hour(s))  Protime-INR     Status: Abnormal   Collection Time: 09/16/14  5:06 PM  Result Value Ref Range   Prothrombin Time 20.6 (H) 11.6 - 15.2 seconds   INR 1.77 (H) 0.00 - 2.75  Basic metabolic panel     Status: Abnormal   Collection Time: 09/16/14  5:06 PM  Result Value Ref Range   Sodium 138 135 - 145 mmol/L   Potassium 3.9 3.5 - 5.1 mmol/L   Chloride 102 101 - 111 mmol/L   CO2 26 22 - 32 mmol/L   Glucose, Bld 85 65 - 99 mg/dL   BUN 28 (H) 6 - 20 mg/dL   Creatinine, Ser 1.17 (H) 0.44 - 1.00 mg/dL   Calcium 9.1 8.9 - 10.3 mg/dL   GFR calc non Af Amer 43 (L) >60 mL/min   GFR calc Af Amer 50 (L) >60 mL/min    Comment: (NOTE) The eGFR has been calculated using the CKD EPI equation. This calculation has not been validated in all clinical situations. eGFR's persistently <60 mL/min signify possible Chronic Kidney Disease.    Anion gap 10 5 - 15  CBC with Differential     Status: Abnormal   Collection Time: 09/16/14  5:06 PM  Result Value Ref Range   WBC 10.7 (H) 4.0 - 10.5 K/uL   RBC 4.58 3.87 - 5.11 MIL/uL   Hemoglobin 14.3 12.0 - 15.0 g/dL   HCT 43.4 36.0 - 46.0 %   MCV 94.8 78.0 - 100.0 fL   MCH 31.2 26.0 - 34.0 pg   MCHC 32.9 30.0 - 36.0 g/dL   RDW 12.2 11.5 - 15.5 %   Platelets 162 150 - 400 K/uL   Neutrophils Relative % 63 43 - 77 %   Neutro Abs 6.8 1.7 - 7.7 K/uL   Lymphocytes Relative 22 12 - 46 %   Lymphs Abs 2.3 0.7 - 4.0 K/uL   Monocytes Relative 11 3 - 12 %   Monocytes Absolute 1.1 (H) 0.1 - 1.0 K/uL   Eosinophils Relative 3 0 - 5 %   Eosinophils Absolute 0.3 0.0 - 0.7 K/uL   Basophils Relative 1 0 - 1 %   Basophils Absolute 0.1 0.0 - 0.1 K/uL  Glucose, capillary     Status: Abnormal   Collection  Time: 09/17/14  1:20 AM  Result Value Ref Range   Glucose-Capillary 170 (H) 65 - 99 mg/dL  CBC  with Differential/Platelet     Status: Abnormal   Collection Time: 09/17/14  5:20 AM  Result Value Ref Range   WBC 7.8 4.0 - 10.5 K/uL   RBC 4.15 3.87 - 5.11 MIL/uL   Hemoglobin 12.7 12.0 - 15.0 g/dL   HCT 38.9 36.0 - 46.0 %   MCV 93.7 78.0 - 100.0 fL   MCH 30.6 26.0 - 34.0 pg   MCHC 32.6 30.0 - 36.0 g/dL   RDW 12.2 11.5 - 15.5 %   Platelets 133 (L) 150 - 400 K/uL   Neutrophils Relative % 60 43 - 77 %   Neutro Abs 4.7 1.7 - 7.7 K/uL   Lymphocytes Relative 22 12 - 46 %   Lymphs Abs 1.7 0.7 - 4.0 K/uL   Monocytes Relative 14 (H) 3 - 12 %   Monocytes Absolute 1.1 (H) 0.1 - 1.0 K/uL   Eosinophils Relative 3 0 - 5 %   Eosinophils Absolute 0.2 0.0 - 0.7 K/uL   Basophils Relative 1 0 - 1 %   Basophils Absolute 0.1 0.0 - 0.1 K/uL  Comprehensive metabolic panel     Status: Abnormal   Collection Time: 09/17/14  5:20 AM  Result Value Ref Range   Sodium 139 135 - 145 mmol/L   Potassium 4.1 3.5 - 5.1 mmol/L   Chloride 104 101 - 111 mmol/L   CO2 27 22 - 32 mmol/L   Glucose, Bld 110 (H) 65 - 99 mg/dL   BUN 25 (H) 6 - 20 mg/dL   Creatinine, Ser 1.15 (H) 0.44 - 1.00 mg/dL   Calcium 8.9 8.9 - 10.3 mg/dL   Total Protein 6.2 (L) 6.5 - 8.1 g/dL   Albumin 3.4 (L) 3.5 - 5.0 g/dL   AST 26 15 - 41 U/L   ALT 17 14 - 54 U/L   Alkaline Phosphatase 102 38 - 126 U/L   Total Bilirubin 1.0 0.3 - 1.2 mg/dL   GFR calc non Af Amer 44 (L) >60 mL/min   GFR calc Af Amer 51 (L) >60 mL/min    Comment: (NOTE) The eGFR has been calculated using the CKD EPI equation. This calculation has not been validated in all clinical situations. eGFR's persistently <60 mL/min signify possible Chronic Kidney Disease.    Anion gap 8 5 - 15  Protime-INR     Status: Abnormal   Collection Time: 09/17/14  5:20 AM  Result Value Ref Range   Prothrombin Time 20.7 (H) 11.6 - 15.2 seconds   INR 1.78 (H) 0.00 - 1.49  Glucose,  capillary     Status: None   Collection Time: 09/17/14  7:25 AM  Result Value Ref Range   Glucose-Capillary 97 65 - 99 mg/dL  Glucose, capillary     Status: Abnormal   Collection Time: 09/17/14 11:36 AM  Result Value Ref Range   Glucose-Capillary 106 (H) 65 - 99 mg/dL  Glucose, capillary     Status: Abnormal   Collection Time: 09/17/14  4:45 PM  Result Value Ref Range   Glucose-Capillary 100 (H) 65 - 99 mg/dL   Comment 1 Notify RN   Glucose, capillary     Status: None   Collection Time: 09/17/14  9:31 PM  Result Value Ref Range   Glucose-Capillary 73 65 - 99 mg/dL    Ct Head Wo Contrast  09/16/2014   CLINICAL DATA:  Initial encounter for fall without head trauma. History of atrial fibrillation. Diabetes.  EXAM: CT HEAD WITHOUT CONTRAST  CT CERVICAL SPINE  WITHOUT CONTRAST  TECHNIQUE: Multidetector CT imaging of the head and cervical spine was performed following the standard protocol without intravenous contrast. Multiplanar CT image reconstructions of the cervical spine were also generated.  COMPARISON:  Head CT of 08/12/2009. No prior cervical spine imaging.  FINDINGS: CT HEAD FINDINGS  Sinuses/Soft tissues: No significant soft tissue swelling. Hyperostosis frontalis interna. Clear paranasal sinuses and mastoid air cells. No skull fracture.  Intracranial: Expected cerebral volume loss for age. Mild low density in the periventricular white matter likely related to small vessel disease. No mass lesion, hemorrhage, hydrocephalus, acute infarct, intra-axial, or extra-axial fluid collection.  CT CERVICAL SPINE FINDINGS  Spinal visualization through the mid T1 level. Prevertebral soft tissues are within normal limits. Subcentimeter left thyroid nodule which is nonspecific. No apical pneumothorax. Marked spondylosis. Prominent osteophytes at C4 through C7. At C4-5, right neural foraminal and central canal stenosis. At C5-6, there is severe central canal and bilateral neural foraminal narrowing.  Straightening of expected lordosis with maintenance of vertebral body height. Heterogeneous osteopenia. Advanced facet arthropathy at multiple levels, most marked in the lower cervical spine. Coronal reformats demonstrate only degenerative change involving the C1-2 articulation.  IMPRESSION: 1.  No acute intracranial abnormality. 2.  Cerebral atrophy and small vessel ischemic change. 3. Advanced cervical spondylosis, without acute fracture or subluxation. This results in central canal and neural foraminal narrowing, which could predispose the patient to cord injury in the setting of trauma. 4. Straightening of expected cervical lordosis could be positional, due to muscular spasm, or ligamentous injury.   Electronically Signed   By: Abigail Miyamoto M.D.   On: 09/16/2014 17:33   Ct Cervical Spine Wo Contrast  09/16/2014   CLINICAL DATA:  Initial encounter for fall without head trauma. History of atrial fibrillation. Diabetes.  EXAM: CT HEAD WITHOUT CONTRAST  CT CERVICAL SPINE WITHOUT CONTRAST  TECHNIQUE: Multidetector CT imaging of the head and cervical spine was performed following the standard protocol without intravenous contrast. Multiplanar CT image reconstructions of the cervical spine were also generated.  COMPARISON:  Head CT of 08/12/2009. No prior cervical spine imaging.  FINDINGS: CT HEAD FINDINGS  Sinuses/Soft tissues: No significant soft tissue swelling. Hyperostosis frontalis interna. Clear paranasal sinuses and mastoid air cells. No skull fracture.  Intracranial: Expected cerebral volume loss for age. Mild low density in the periventricular white matter likely related to small vessel disease. No mass lesion, hemorrhage, hydrocephalus, acute infarct, intra-axial, or extra-axial fluid collection.  CT CERVICAL SPINE FINDINGS  Spinal visualization through the mid T1 level. Prevertebral soft tissues are within normal limits. Subcentimeter left thyroid nodule which is nonspecific. No apical pneumothorax.  Marked spondylosis. Prominent osteophytes at C4 through C7. At C4-5, right neural foraminal and central canal stenosis. At C5-6, there is severe central canal and bilateral neural foraminal narrowing. Straightening of expected lordosis with maintenance of vertebral body height. Heterogeneous osteopenia. Advanced facet arthropathy at multiple levels, most marked in the lower cervical spine. Coronal reformats demonstrate only degenerative change involving the C1-2 articulation.  IMPRESSION: 1.  No acute intracranial abnormality. 2.  Cerebral atrophy and small vessel ischemic change. 3. Advanced cervical spondylosis, without acute fracture or subluxation. This results in central canal and neural foraminal narrowing, which could predispose the patient to cord injury in the setting of trauma. 4. Straightening of expected cervical lordosis could be positional, due to muscular spasm, or ligamentous injury.   Electronically Signed   By: Abigail Miyamoto M.D.   On: 09/16/2014 17:33   Mr Hip  Right Wo Contrast  09/17/2014   CLINICAL DATA:  Right hip pain since a fall on stairs that gave way.  EXAM: MR OF THE RIGHT HIP WITHOUT CONTRAST  TECHNIQUE: Multiplanar, multisequence MR imaging was performed. No intravenous contrast was administered.  COMPARISON:  Radiographs dated 09/16/2014  FINDINGS: Bones: There is a vertical fracture through the right sacral ala. There also a nondisplaced fractures of right superior pubic ramus at the pubic body and of the right inferior pubic ramus. There is disruption of the distal right iliopsoas tendon with 3.5 cm of proximal retraction. There is also edema in the right external obturator muscle.  Soft tissue contusion in the subcutaneous fat lateral to the left hip.  There is a mild to moderate osteoarthritis of both hips. No joint effusions. No bursitis. No adenopathy or mass lesions.  IMPRESSION: 1. Fractures of the right sacral ala and of the right inferior and superior pubic rami. 2. Rupture  of the distal iliopsoas tendon with proximal retraction. 3. Strain of the right external obturator muscle.   Electronically Signed   By: Lorriane Shire M.D.   On: 09/17/2014 08:04   Dg Hips Bilat With Pelvis 3-4 Views  09/16/2014   CLINICAL DATA:  [place] through the stairs today at her sisters house. Bilateral hip pain, right worse than left.  EXAM: BILATERAL HIP (WITH PELVIS) 3-4 VIEWS  COMPARISON:  None.  FINDINGS: No evidence of pelvic fracture. Left hip shows mild osteoarthritis with marginal osteophytes. The right hip shows more advanced osteoarthritis with more extensive marginal osteophytes. There is also calcification between the femur and the ischium probably related to old injury with heterotopic change.  IMPRESSION: No acute traumatic finding. Osteoarthritis of both hips. Heterotopic calcification between the right femur in the ischium probably related to old injury.   Electronically Signed   By: Nelson Chimes M.D.   On: 09/16/2014 17:55    Review of Systems  HENT: Negative for congestion, ear discharge, ear pain, hearing loss, nosebleeds, sore throat and tinnitus.   Eyes: Negative.   Respiratory: Negative.  Negative for stridor.   Cardiovascular: Negative.   Gastrointestinal: Negative.   Genitourinary: Positive for frequency. Negative for dysuria, urgency, hematuria and flank pain.  Musculoskeletal: Positive for myalgias, joint pain and falls. Negative for back pain and neck pain.  Skin: Negative for itching and rash.  Neurological: Positive for focal weakness and weakness. Negative for dizziness, tingling, tremors, sensory change, speech change, seizures, loss of consciousness and headaches.  Endo/Heme/Allergies: Negative for environmental allergies and polydipsia. Bruises/bleeds easily.  Psychiatric/Behavioral: Negative for depression, suicidal ideas, hallucinations, memory loss and substance abuse. The patient is not nervous/anxious and does not have insomnia.    Blood pressure 155/49,  pulse 71, temperature 99 F (37.2 C), temperature source Oral, resp. rate 18, height 5' 6" (1.676 m), weight 81.421 kg (179 lb 8 oz), SpO2 98 %. Physical Exam General: Alert, no acute distress Cardiovascular: No pedal edema Respiratory: No cyanosis, no use of accessory musculature GI: No organomegaly, abdomen is soft and non-tender Skin: No lesions in the area of chief complaint Neurologic: Sensation intact distally Psychiatric: Patient is competent for consent with normal mood and affect Lymphatic: No axillary or cervical lymphadenopathy   Orthopaedic Exam: Awake, alert and oriented x 4. Lying in hospital bed with [facility] elevated 20 degrees. Legs are both warm. Right anterior hip with mild swelling, right thigh and Right leg with mild swelling. Pulses DP 2+/2+, PT 2+/2+. Weak right hip flexors and any movement  accompanied with significant right hip pain. Tender right posterior pelvis and Right sacrum. Left is nontender. Compression test of pelvis is painful right anterior pelvis. Upper extremities with no deformity, normal motor both arms.C-Spine is nontender and [place] without pain, stiffness of the neck chronically.  Assessment/Plan:                                     1)Nondisplaced right superior and inferior pubic rami fractures, nondisplaced right vertical sacral fracture.    2)Right iliopsoas tendon rupture off the right lesser trochanter with 3 cm of retraction.    3)Minimal renal insufficiency, Cr 1.15, GFR 47    4)Atrial fibrillation with chronic coumadinization.    5)Diabetes mellitus Plan: Patient presently is unable to weight-bear or move to use the right hip secondary to right hip flexor tendon dysfunction and fractures of the bony pelvis. She is unable to care for herself and require short-term stay at the skilled nursing facility. There she will remain at bedrest will require to logroll from back to the left side every 2-3 hours. Careful attention to prevent decubiti secondary  to a prolonged pressure over a bony areas of the posterior pelvis and sacrum and heels. We'll need to remain on anticoagulation for anti-DVT prophylaxis. Recommend starting calcitonin nasal spray as means of the decreasing bone pain and improved healing tendencies. She may require Miacalcin for short-term treatment of osteopenia. After period of 1-2 weeks she may be mobilized from bed to chair and bed to wheelchair weightbearing as tolerated on the right lower extremity. Gradually over the next 4-6 weeks expect that she'll show improvement in her capacity to bear weight on the right lower extremity healing the fracture of the right pelvis over a period of 6-12 weeks. Stool softeners, narcotic pain meds.  She has indicated a preference for [facility] or [facility] as her daughter lives in [place].  NITKA,JAMES E 09/17/2014, 10:17 PM

## 2014-09-18 DIAGNOSIS — E119 Type 2 diabetes mellitus without complications: Secondary | ICD-10-CM

## 2014-09-18 DIAGNOSIS — I4891 Unspecified atrial fibrillation: Secondary | ICD-10-CM

## 2014-09-18 DIAGNOSIS — S329XXA Fracture of unspecified parts of lumbosacral spine and pelvis, initial encounter for closed fracture: Secondary | ICD-10-CM | POA: Diagnosis not present

## 2014-09-18 DIAGNOSIS — S3210XA Unspecified fracture of sacrum, initial encounter for closed fracture: Secondary | ICD-10-CM | POA: Diagnosis not present

## 2014-09-18 DIAGNOSIS — Z7901 Long term (current) use of anticoagulants: Secondary | ICD-10-CM

## 2014-09-18 DIAGNOSIS — I10 Essential (primary) hypertension: Secondary | ICD-10-CM

## 2014-09-18 LAB — HEMOGLOBIN A1C
Hgb A1c MFr Bld: 6 % — ABNORMAL HIGH (ref 4.8–5.6)
Mean Plasma Glucose: 126 mg/dL

## 2014-09-18 LAB — GLUCOSE, CAPILLARY
Glucose-Capillary: 117 mg/dL — ABNORMAL HIGH (ref 65–99)
Glucose-Capillary: 110 mg/dL — ABNORMAL HIGH (ref 65–99)

## 2014-09-18 LAB — PROTIME-INR
INR: 1.58 — AB (ref 0.00–1.49)
Prothrombin Time: 18.9 seconds — ABNORMAL HIGH (ref 11.6–15.2)

## 2014-09-18 MED ORDER — WARFARIN - PHARMACIST DOSING INPATIENT: Freq: Once | Status: AC

## 2014-09-18 MED ORDER — WARFARIN SODIUM 2.5 MG PO TABS
2.5000 mg | ORAL_TABLET | Freq: Once | ORAL | Status: AC
  Administered 2014-09-18: 2.5 mg via ORAL
  Filled 2014-09-18: qty 1

## 2014-09-18 MED ORDER — SENNA 8.6 MG PO TABS: 1.0000 | ORAL_TABLET | Freq: Two times a day (BID) | ORAL | Status: DC

## 2014-09-18 MED ORDER — CALCITONIN (SALMON) 200 UNIT/ACT NA SOLN: 1.0000 | Freq: Every day | NASAL | Status: DC

## 2014-09-18 MED ORDER — ALPRAZOLAM 0.25 MG PO TABS: 0.2500 mg | ORAL_TABLET | Freq: Every day | ORAL | Status: DC | PRN

## 2014-09-18 NOTE — Clinical Social Work Placement (Signed)
   CLINICAL SOCIAL WORK PLACEMENT  NOTE  Date:  09/18/2014  Patient Details  Name: Janice [name] MRN: [id number] Date of Birth: [birth year]  Clinical Social Work is seeking post-discharge placement for this patient at the [place] level of care (*CSW will initial, date and re-position this form in  chart as items are completed):  Yes   Patient/family provided with [place] Work Department's list of facilities offering this level of care within the geographic area requested by the patient (or if unable, by the patient's family).  Yes   Patient/family informed of their freedom to choose among providers that offer the needed level of care, that participate in Medicare, Medicaid or managed care program needed by the patient, have an available bed and are willing to accept the patient.  Yes   Patient/family informed of [facility]'s ownership interest in [facility] and [facility], as well as of the fact that they are under no obligation to receive care at these facilities.  PASRR submitted to EDS on 09/18/14     PASRR number received on 09/18/14     Existing PASRR number confirmed on       FL2 transmitted to all facilities in geographic area requested by pt/family on 09/18/14     FL2 transmitted to all facilities within larger geographic area on 09/18/14     Patient informed that his/her managed care company has contracts with or will negotiate with certain facilities, including the following:        Yes   Patient/family informed of bed offers received.  Patient chooses bed at [facility]     Physician recommends and patient chooses bed at      Patient to be transferred to [facility] on 09/18/14.  Patient to be transferred to facility by [facility]     Patient family notified on 09/18/14 of transfer.  Name of family member notified:  Sister at bedside     PHYSICIAN       Additional Comment:     _______________________________________________ Boone Master, [place] 09/18/2014, 11:17 AM

## 2014-09-18 NOTE — Discharge Summary (Addendum)
Physician Discharge Summary  Janice [name] EZM:[id number] DOB: 1931/06/04 DOA: 09/16/2014  PCP: Vidal Schwalbe, MD  Admit date: 09/16/2014 Discharge date: 09/18/2014  Time spent: 20 minutes  Recommendations for Outpatient Follow-up:  1. Follow up with PCP in 1-2 weeks 2. Please recheck INR within one week, goal INR 2-3 for afib  Discharge Diagnoses:  Principal Problem:   Sacral fracture, closed Active Problems:   Atrial fibrillation   Diabetes mellitus without complication   Hypertension   Pelvic fracture   Warfarin anticoagulation   Discharge Condition: Stable  Diet recommendation: Diabetic, heart healthy  Filed Weights   09/17/14 0900  Weight: 81.421 kg (179 lb 8 oz)    History of present illness:  Please see admit h and p from 7/5 for details. Briefly, pt presented following a mechanical fall resulting in nondisplaced R superior and inferior pubic rami fractures with nondisplaced R vertical sacral fracture. Patient was admitted for further work up.  Hospital Course:  Closed right sacral fracture / right superior and inf pubic ramus fracture with iliopsoas tendon rupture -Pain controled with prn percocet and robaxin while inpaitent -[facility] was consulted with recommendations for bedrest and eventual mobilization from bed to chair after 1-2 weeks and over the following 4-6 weeks, weight bearing -Added bowel regimen.  Essential HTN -Continued amlodipine, metoprolol and Avapro  Type 2 diabetes mellitus -While inpatient was continued with sliding scale insulin. Continued Avapro  Dyslipidemia -Continue statin  A. fib -Rate controlled. Continue beta blocker. Initially held Coumadin pending orthopedics evaluation. -Orthopedic surgery recommended resuming anticoagulation for DVT prophylaxis.  Consultations:  Orthopedic Surgery  Discharge Exam: Filed Vitals:   09/17/14 2328 09/18/14 0322 09/18/14 0952 09/18/14 1017  BP: 131/54 142/63 112/52 112/52   Pulse: 67 60 64 64  Temp: 97.8 F (36.6 C) 98.3 F (36.8 C)  98.2 F (36.8 C)  TempSrc: Oral Oral  Oral  Resp: 17 18  18   Height:      Weight:      SpO2: 95% 96%  98%   General: Awake, in nad Cardiovascular: regular, s1, s2 Respiratory: normal resp effort, no wheezing  Discharge Instructions      Discharge Instructions    Touch down weight bearing    Complete by:  As directed   Laterality:  right  Extremity:  Lower            Medication List    STOP taking these medications        HYDROcodone-acetaminophen 5-325 MG per tablet  Commonly known as:  NORCO/VICODIN      TAKE these medications        ALPRAZolam 0.25 MG tablet  Commonly known as:  XANAX  Take 0.25 mg by mouth daily as needed for anxiety.     ALPRAZolam 0.25 MG tablet  Commonly known as:  XANAX  Take 1 tablet (0.25 mg total) by mouth daily as needed for anxiety.     amLODipine 2.5 MG tablet  Commonly known as:  NORVASC  Take 2.5 mg by mouth daily.     aspirin 81 MG tablet  Take 81 mg by mouth daily.     calcitonin (salmon) 200 UNIT/ACT nasal spray  Commonly known as:  MIACALCIN/FORTICAL  Place 1 spray into alternate nostrils daily.     irbesartan 300 MG tablet  Commonly known as:  AVAPRO  Take 300 mg by mouth daily.     LUTEIN PO  Take 1 tablet by mouth daily.     metoprolol 50  MG tablet  Commonly known as:  LOPRESSOR  Take 50 mg by mouth 2 (two) times daily.     nitroGLYCERIN 0.4 MG SL tablet  Commonly known as:  NITROSTAT  Place 0.4 mg under the tongue every 5 (five) minutes as needed.     oxyCODONE 5 MG immediate release tablet  Commonly known as:  ROXICODONE  Take 1 tablet (5 mg total) by mouth every 4 (four) hours as needed for severe pain.     polyethylene glycol packet  Commonly known as:  MIRALAX / GLYCOLAX  Take 17 g by mouth daily as needed for mild constipation.     senna 8.6 MG Tabs tablet  Commonly known as:  SENOKOT  Take 1 tablet (8.6 mg total) by mouth 2  (two) times daily.     simvastatin 40 MG tablet  Commonly known as:  ZOCOR  Take 20 mg by mouth daily at 6 PM.     Vitamin B-12 500 MCG Subl  Place 1 tablet under the tongue daily.     warfarin 2.5 MG tablet  Commonly known as:  COUMADIN  Take 1.25-2.5 mg by mouth daily. Take 1.25 mg on Tuesday, Wednesday, Friday, Saturday, Sunday and 2.5 mg on Mondays and Thursdays per Dr. Orest Dikes office       Allergies  Allergen Reactions  . Demerol [Meperidine] Nausea Only    And vertigo  . Meperidine Hcl     REACTION: nausea and vomiting   Follow-up Information    Follow up with Vidal Schwalbe, MD. Schedule an appointment as soon as possible for a visit in 1 week.   Specialty:  Family Medicine   Why:  Hospital follow up   Contact information:   [place] [address] [phone]        The results of significant diagnostics from this hospitalization (including imaging, microbiology, ancillary and laboratory) are listed below for reference.    Significant Diagnostic Studies: Ct Head Wo Contrast  09/16/2014   CLINICAL DATA:  Initial encounter for fall without head trauma. History of atrial fibrillation. Diabetes.  EXAM: CT HEAD WITHOUT CONTRAST  CT CERVICAL SPINE WITHOUT CONTRAST  TECHNIQUE: Multidetector CT imaging of the head and cervical spine was performed following the standard protocol without intravenous contrast. Multiplanar CT image reconstructions of the cervical spine were also generated.  COMPARISON:  Head CT of 08/12/2009. No prior cervical spine imaging.  FINDINGS: CT HEAD FINDINGS  Sinuses/Soft tissues: No significant soft tissue swelling. Hyperostosis frontalis interna. Clear paranasal sinuses and mastoid air cells. No skull fracture.  Intracranial: Expected cerebral volume loss for age. Mild low density in the periventricular white matter likely related to small vessel disease. No mass lesion, hemorrhage, hydrocephalus, acute infarct, intra-axial,  or extra-axial fluid collection.  CT CERVICAL SPINE FINDINGS  Spinal visualization through the mid T1 level. Prevertebral soft tissues are within normal limits. Subcentimeter left thyroid nodule which is nonspecific. No apical pneumothorax. Marked spondylosis. Prominent osteophytes at C4 through C7. At C4-5, right neural foraminal and central canal stenosis. At C5-6, there is severe central canal and bilateral neural foraminal narrowing. Straightening of expected lordosis with maintenance of vertebral body height. Heterogeneous osteopenia. Advanced facet arthropathy at multiple levels, most marked in the lower cervical spine. Coronal reformats demonstrate only degenerative change involving the C1-2 articulation.  IMPRESSION: 1.  No acute intracranial abnormality. 2.  Cerebral atrophy and small vessel ischemic change. 3. Advanced cervical spondylosis, without acute fracture or subluxation. This results in  central canal and neural foraminal narrowing, which could predispose the patient to cord injury in the setting of trauma. 4. Straightening of expected cervical lordosis could be positional, due to muscular spasm, or ligamentous injury.   Electronically Signed   By: Abigail Miyamoto M.D.   On: 09/16/2014 17:33   Ct Cervical Spine Wo Contrast  09/16/2014   CLINICAL DATA:  Initial encounter for fall without head trauma. History of atrial fibrillation. Diabetes.  EXAM: CT HEAD WITHOUT CONTRAST  CT CERVICAL SPINE WITHOUT CONTRAST  TECHNIQUE: Multidetector CT imaging of the head and cervical spine was performed following the standard protocol without intravenous contrast. Multiplanar CT image reconstructions of the cervical spine were also generated.  COMPARISON:  Head CT of 08/12/2009. No prior cervical spine imaging.  FINDINGS: CT HEAD FINDINGS  Sinuses/Soft tissues: No significant soft tissue swelling. Hyperostosis frontalis interna. Clear paranasal sinuses and mastoid air cells. No skull fracture.  Intracranial:  Expected cerebral volume loss for age. Mild low density in the periventricular white matter likely related to small vessel disease. No mass lesion, hemorrhage, hydrocephalus, acute infarct, intra-axial, or extra-axial fluid collection.  CT CERVICAL SPINE FINDINGS  Spinal visualization through the mid T1 level. Prevertebral soft tissues are within normal limits. Subcentimeter left thyroid nodule which is nonspecific. No apical pneumothorax. Marked spondylosis. Prominent osteophytes at C4 through C7. At C4-5, right neural foraminal and central canal stenosis. At C5-6, there is severe central canal and bilateral neural foraminal narrowing. Straightening of expected lordosis with maintenance of vertebral body height. Heterogeneous osteopenia. Advanced facet arthropathy at multiple levels, most marked in the lower cervical spine. Coronal reformats demonstrate only degenerative change involving the C1-2 articulation.  IMPRESSION: 1.  No acute intracranial abnormality. 2.  Cerebral atrophy and small vessel ischemic change. 3. Advanced cervical spondylosis, without acute fracture or subluxation. This results in central canal and neural foraminal narrowing, which could predispose the patient to cord injury in the setting of trauma. 4. Straightening of expected cervical lordosis could be positional, due to muscular spasm, or ligamentous injury.   Electronically Signed   By: Abigail Miyamoto M.D.   On: 09/16/2014 17:33   Mr Hip Right Wo Contrast  09/17/2014   CLINICAL DATA:  Right hip pain since a fall on stairs that gave way.  EXAM: MR OF THE RIGHT HIP WITHOUT CONTRAST  TECHNIQUE: Multiplanar, multisequence MR imaging was performed. No intravenous contrast was administered.  COMPARISON:  Radiographs dated 09/16/2014  FINDINGS: Bones: There is a vertical fracture through the right sacral ala. There also a nondisplaced fractures of right superior pubic ramus at the pubic body and of the right inferior pubic ramus. There is  disruption of the distal right iliopsoas tendon with 3.5 cm of proximal retraction. There is also edema in the right external obturator muscle.  Soft tissue contusion in the subcutaneous fat lateral to the left hip.  There is a mild to moderate osteoarthritis of both hips. No joint effusions. No bursitis. No adenopathy or mass lesions.  IMPRESSION: 1. Fractures of the right sacral ala and of the right inferior and superior pubic rami. 2. Rupture of the distal iliopsoas tendon with proximal retraction. 3. Strain of the right external obturator muscle.   Electronically Signed   By: Lorriane Shire M.D.   On: 09/17/2014 08:04   Dg Hips Bilat With Pelvis 3-4 Views  09/16/2014   CLINICAL DATA:  [place] through the stairs today at her sisters house. Bilateral hip pain, right worse than left.  EXAM:  BILATERAL HIP (WITH PELVIS) 3-4 VIEWS  COMPARISON:  None.  FINDINGS: No evidence of pelvic fracture. Left hip shows mild osteoarthritis with marginal osteophytes. The right hip shows more advanced osteoarthritis with more extensive marginal osteophytes. There is also calcification between the femur and the ischium probably related to old injury with heterotopic change.  IMPRESSION: No acute traumatic finding. Osteoarthritis of both hips. Heterotopic calcification between the right femur in the ischium probably related to old injury.   Electronically Signed   By: Nelson Chimes M.D.   On: 09/16/2014 17:55    Microbiology: No results found for this or any previous visit (from the past 240 hour(s)).   Labs: Basic Metabolic Panel:  Recent Labs Lab 09/16/14 1706 09/17/14 0520  NA 138 139  K 3.9 4.1  CL 102 104  CO2 26 27  GLUCOSE 85 110*  BUN 28* 25*  CREATININE 1.17* 1.15*  CALCIUM 9.1 8.9   Liver Function Tests:  Recent Labs Lab 09/17/14 0520  AST 26  ALT 17  ALKPHOS 102  BILITOT 1.0  PROT 6.2*  ALBUMIN 3.4*   No results for input(s): LIPASE, AMYLASE in the last 168 hours. No results for input(s):  AMMONIA in the last 168 hours. CBC:  Recent Labs Lab 09/16/14 1706 09/17/14 0520  WBC 10.7* 7.8  NEUTROABS 6.8 4.7  HGB 14.3 12.7  HCT 43.4 38.9  MCV 94.8 93.7  PLT 162 133*   Cardiac Enzymes: No results for input(s): CKTOTAL, CKMB, CKMBINDEX, TROPONINI in the last 168 hours. BNP: BNP (last 3 results) No results for input(s): BNP in the last 8760 hours.  ProBNP (last 3 results) No results for input(s): PROBNP in the last 8760 hours.  CBG:  Recent Labs Lab 09/17/14 1136 09/17/14 1645 09/17/14 2131 09/18/14 0704 09/18/14 1132  GLUCAP 106* 100* 73 117* 110*     Signed:  Yen Wandell K  [facility] 09/18/2014, 6:40 PM

## 2014-09-18 NOTE — Evaluation (Signed)
Physical Therapy Evaluation Patient Details Name: Janice [name] MRN: [id number] DOB: 08-20-1931 Today's Date: 09/18/2014   History of Present Illness  patient   admitted after fall when stairs gave way on 09/16/14.Patient sustained a right sacral ala fracture with right superior and inferior pubic ramus fracture. MRI of the pelvis also showed rupture of iliopsoas tendon with proximal retraction.  Clinical Impression  Patient tolerated  Transfer from Bed to recliner with assist of 2 persons, able to maintain TDWB on RLE. Patient will benefit from PT to address problems listed in note below.    Follow Up Recommendations SNF;Supervision/Assistance - 24 hour    Equipment Recommendations  None recommended by PT    Recommendations for Other Services       Precautions / Restrictions Precautions Precautions: Fall Restrictions Weight Bearing Restrictions: Yes RLE Weight Bearing: Touchdown weight bearing      Mobility  Bed Mobility Overal bed mobility: Needs Assistance Bed Mobility: Supine to Sit     Supine to sit: Mod assist;+2 for safety/equipment;HOB elevated;+2 for physical assistance     General bed mobility comments: cues on technique, assist for R leg to move to edge of bed, assist with trunk into upright,.  Transfers Overall transfer level: Needs assistance Equipment used: Rolling walker (2 wheeled) Transfers: Sit to/from [facility] Sit to Stand: +2 safety/equipment;Mod assist;From elevated surface Stand pivot transfers: +2 safety/equipment;Min assist       General transfer comment: multimodal cues for TDWB. patient did attempt to hop, genertally scooted around on shoed L foot, maintained TDWB well.  Ambulation/Gait             General Gait Details: NT  Stairs            Wheelchair Mobility    Modified Rankin (Stroke Patients Only)       Balance                                             Pertinent  Vitals/Pain Pain Assessment: 0-10 Pain Score: 5  Pain Descriptors / Indicators: Discomfort;Guarding Pain Intervention(s): Monitored during session;Premedicated before session    Home Living Family/patient expects to be discharged to:: Skilled nursing facility Living Arrangements: Alone                    Prior Function                 Hand Dominance        Extremity/Trunk Assessment               Lower Extremity Assessment: RLE deficits/detail;LLE deficits/detail RLE Deficits / Details: requires assist to move leg across tghe bed LLE Deficits / Details: noted edema throughout leg, patient reports normal  Cervical / Trunk Assessment: Normal  Communication      Cognition Arousal/Alertness: Awake/alert Behavior During Therapy: WFL for tasks assessed/performed Overall Cognitive Status: Within Functional Limits for tasks assessed                      General Comments      Exercises        Assessment/Plan    PT Assessment Patient needs continued PT services  PT Diagnosis Difficulty walking;Acute pain   PT Problem List Decreased strength;Decreased range of motion;Decreased activity tolerance;Decreased mobility;Impaired sensation;Decreased knowledge of precautions;Decreased safety awareness;Decreased knowledge of use of DME;Pain  PT Treatment Interventions DME instruction;Gait training;Therapeutic activities;Functional mobility training;Therapeutic exercise;Patient/family education   PT Goals (Current goals can be found in the Care Plan section) Acute Rehab PT Goals Patient Stated Goal: to get back home PT Goal Formulation: With patient Time For Goal Achievement: 10/02/14 Potential to Achieve Goals: Good    Frequency Min 3X/week   Barriers to discharge Decreased caregiver support      Co-evaluation PT/OT/SLP Co-Evaluation/Treatment: Yes Reason for Co-Treatment: Complexity of the patient's impairments (multi-system involvement);For  patient/therapist safety PT goals addressed during session: Mobility/safety with mobility         End of Session Equipment Utilized During Treatment: Gait belt Activity Tolerance: Patient tolerated treatment well Patient left: in chair;with call bell/phone within reach Nurse Communication: Mobility status    Functional Assessment Tool Used: cliical judgement Functional Limitation: Mobility: Walking and moving around Mobility: Walking and Moving Around Current Status (T5573): At least 80 percent but less than 100 percent impaired, limited or restricted Mobility: Walking and Moving Around Goal Status [phone]): At least 1 percent but less than 20 percent impaired, limited or restricted    Time: 0832-0851 PT Time Calculation (min) (ACUTE ONLY): 19 min   Charges:   PT Evaluation $Initial PT Evaluation Tier I: 1 Procedure     PT G Codes:   PT G-Codes **NOT FOR INPATIENT CLASS** Functional Assessment Tool Used: cliical judgement Functional Limitation: Mobility: Walking and moving around Mobility: Walking and Moving Around Current Status (K2706): At least 80 percent but less than 100 percent impaired, limited or restricted Mobility: Walking and Moving Around Goal Status [phone]): At least 1 percent but less than 20 percent impaired, limited or restricted    Claretha Cooper 09/18/2014, 9:31 AM  Tresa Endo PT [phone]

## 2014-09-18 NOTE — Progress Notes (Signed)
ANTICOAGULATION CONSULT NOTE - Initial Consult  Pharmacy Consult for Warfarin Indication: Atrial fibrillation  Allergies  Allergen Reactions  . Demerol [Meperidine] Nausea Only    And vertigo  . Meperidine Hcl     REACTION: nausea and vomiting    Patient Measurements: Height: 5\' 6"  (167.6 cm) Weight: 179 lb 8 oz (81.421 kg) IBW/kg (Calculated) : 59.3  Vital Signs: Temp: 98.2 F (36.8 C) (07/06 1017) Temp Source: Oral (07/06 1017) BP: 112/52 mmHg (07/06 1017) Pulse Rate: 64 (07/06 1017)  Labs:  Recent Labs  09/16/14 1706 09/17/14 0520 09/18/14 1025  HGB 14.3 12.7  --   HCT 43.4 38.9  --   PLT 162 133*  --   LABPROT 20.6* 20.7* 18.9*  INR 1.77* 1.78* 1.58*  CREATININE 1.17* 1.15*  --     Estimated Creatinine Clearance: 41.9 mL/min (by C-G formula based on Cr of 1.15).   Medical History: Past Medical History  Diagnosis Date  . Atrial fibrillation   . Diabetes mellitus   . Hypertension   . Hyperlipidemia   . Chest pain   . Allergic rhinitis   . Obesity   . Osteoarthritis   . Cataracts, bilateral   . Endometrial cancer   . GERD (gastroesophageal reflux disease)   . CKD (chronic kidney disease)   . Lymphedema      Assessment: 14 y/oF with PMH of a-fib on warfarin anticoagulation, DM, HTN, HLD who presented s/p mechanical fall.resulting in nondisplaced R superior and inferior pubic rami fractures with nondisplaced R vertical sacral fracture.  Warfarin initially held on admission pending ortho evaluation. No surgical intervention recommended per ortho, so warfarin resumed today with pharmacy requested to dose while inpatient.   Home dose: Warfarin 2.5 mg on Mondays and Thursdays, 1.25 mg on all other days of the week (dosage confirmed with Dr. Orest Dikes office who manages Warfarin therapy as an outpatient-INR was 2.6 on this dosage when patient was seen there on 09/05/14). Last dose PTA was reported per patient as 7/4 at 0800.  INR 1.77 on admission, no  warfarin given yesterday  INR 1.58 today  H/H stable, Pltc slightly low  No bleeding issues reported  CT head on 7/4: No acute intracranial abnormality.  Goal of Therapy:  INR 2-3 Monitor platelets by anticoagulation protocol: Yes   Plan:  Warfarin 2.5 mg PO x 1 prior to discharge.  Warfarin management, including follow-up, post-discharge per MD (see discharge orders).  Lindell Spar, PharmD, BCPS Pager: [phone] 09/18/2014 12:22 PM

## 2014-09-18 NOTE — Clinical Social Work Note (Signed)
Clinical Social Work Assessment  Patient Details  Name: Janice [name] MRN: [id number] Date of Birth: 22-Oct-1931  Date of referral:  09/18/14               Reason for consult:  Discharge Planning                Permission sought to share information with:  Family Supports Permission granted to share information::  Yes, Verbal Permission Granted  Name::        Agency::     Relationship::  Sister  Contact Information:     Housing/Transportation Living arrangements for the past 2 months:  Single Family Home Source of Information:  Patient Patient Interpreter Needed:  None Criminal Activity/Legal Involvement Pertinent to Current Situation/Hospitalization:  No - Comment as needed Significant Relationships:  Other Family Members Lives with:  Self Do you feel safe going back to the place where you live?  No Need for family participation in patient care:  Yes (Comment)  Care giving concerns:  Patient is non weight bearing and cannot return home due to limited support.   Social Worker assessment / plan:  CSW received referral in order to assist with DC planning. CSW reviewed chart and met with patient and sister at bedside. CSW introduced myself and explained role.  Patient reports she fell at home and is now unable to ambulate on her own. Patient reports she has visited friends in SNFs in the past but has never been a patient. Patient feels she cannot return home and is agreeable to SNF placement. CSW provided SNF list and explained process. Patient prefers placement at [facility] or [facility]. Patient reports that she wants her family to be able to visit her.  CSW completed FL2 and faxed out. CSW will follow up with bed offers.  Employment status:  Retired Nurse, adult PT Recommendations:  [place] / Referral to community resources:  [place]  Patient/Family's Response to care:  Patient and family hopeful for  first choice at [employer] so they can visit often. Patient reports she wishes she could go straight home but knows she will receive good care and therapy at SNF.  Patient/Family's Understanding of and Emotional Response to Diagnosis, Current Treatment, and Prognosis:  Patient alert and oriented and engaged in assessment. Patient reports that she likes being independent but will be motivated to work hard at [facility] so she can return home quickly. Patient reports she has had medical problems in the past but none that required this much assistance at DC.  Emotional Assessment Appearance:  Appears stated age Attitude/Demeanor/Rapport:  Other Affect (typically observed):  Appropriate Orientation:  Oriented to Self, Oriented to Place, Oriented to  Time, Oriented to Situation Alcohol / Substance use:  Not Applicable Psych involvement (Current and /or in the community):  No (Comment)  Discharge Needs  Concerns to be addressed:  No discharge needs identified Readmission within the last 30 days:  No Current discharge risk:  None Barriers to Discharge:  No Barriers Identified   Boone Master, [place] 09/18/2014, 10:14 AM [phone]

## 2014-09-18 NOTE — Care Management Note (Signed)
Case Management Note  Patient Details  Name: Janice [name] MRN: [id number] Date of Birth: Apr 07, 1931  Subjective/Objective: 81 y/o f admitted w/R no displaced sacral fx, & r superior/inferior pubic rami fx.Bedrest.Ortho following. Observation status.From home. CSW following. D/C plan SNF.                   Action/Plan:d/c plan SNF.   Expected Discharge Date:                  Expected Discharge Plan:  Skilled Nursing Facility  In-House Referral:  Clinical Social Work  Discharge planning Services  CM Consult  Post Acute Care Choice:    Choice offered to:     DME Arranged:    DME Agency:     HH Arranged:    [place] Agency:     Status of Service:  In process, will continue to follow  Medicare Important Message Given:    Date Medicare IM Given:    Medicare IM give by:    Date Additional Medicare IM Given:    Additional Medicare Important Message give by:     If discussed at [place] of Stay Meetings, dates discussed:    Additional Comments:  Dessa Phi, RN 09/18/2014, 9:20 AM

## 2014-09-18 NOTE — Progress Notes (Signed)
Clinical Social Work  CSW spoke with patient who accepted [facility]'s bed offer. CSW faxed DC summary to SNF and they are agreeable to accept after lunch. CSW prepared DC packet with FL2, DC summary and hard scripts included. RN to call report. Patient requests [facility] for transportation and aware of no guarantee of payment. Patient is very happy to go to first choice SNF and hopeful for speedy recovery.  [facility] arranged; request # [id number].  CSW is signing off but available if needed.  [place], [place] [phone]

## 2014-09-18 NOTE — Evaluation (Signed)
Occupational Therapy Evaluation Patient Details Name: Janice [name] MRN: [id number] DOB: [birth year] Today's Date: 09/18/2014    History of Present Illness patient   admitted after fall when stairs gave way on 09/16/14.Patient sustained a right sacral ala fracture with right superior and inferior pubic ramus fracture. MRI of the pelvis also showed rupture of iliopsoas tendon with proximal retraction.   Clinical Impression   This 81 year old female was admitted for the above. She will benefit from skilled OT.  Pt was mod I with ADLs prior to admission. She currently needs min A x 2 for safety for transfers and up to total A x 2 for ADLs.  Goals in acute are for min A.  Pt has TDWB restrictions on RLE    Follow Up Recommendations  SNF    Equipment Recommendations   (tba further for 3:1 commode (has high commode))    Recommendations for Other Services       Precautions / Restrictions Precautions Precautions: Fall Restrictions Weight Bearing Restrictions: Yes RLE Weight Bearing: Touchdown weight bearing Other Position/Activity Restrictions: TDWB      Mobility Bed Mobility Overal bed mobility: Needs Assistance Bed Mobility: Supine to Sit     Supine to sit: Mod assist;+2 for safety/equipment;HOB elevated;+2 for physical assistance     General bed mobility comments: cues on technique, assist for R leg to move to edge of bed, assist with trunk into upright,.  Transfers Overall transfer level: Needs assistance Equipment used: Rolling walker (2 wheeled) Transfers: Sit to/from [facility] Sit to Stand: +2 safety/equipment;Mod assist;From elevated surface Stand pivot transfers: +2 safety/equipment;Min assist       General transfer comment: multimodal cues for TDWB. patient did attempt to hop, genertally scooted around on shoed L foot, maintained TDWB well.    Balance Overall balance assessment: History of Falls (uses RW due to this)                                          ADL Overall ADL's : Needs assistance/impaired     Grooming: Set up;Sitting   Upper Body Bathing: Set up;Sitting   Lower Body Bathing: Maximal assistance;+2 for safety/equipment;Sit to/from stand   Upper Body Dressing : Set up;Sitting   Lower Body Dressing: Total assistance;+2 for safety/equipment;Sit to/from stand   Toilet Transfer: Minimal assistance;+2 for safety/equipment;RW (recliner)   Toileting- Clothing Manipulation and Hygiene: Maximal assistance;+2 for safety/equipment;Sit to/from stand         General ADL Comments: Pt able to hop and pivot to recliner maintaining TDWB.  Educated on concept of AE but did not try this session     Vision     Perception     Praxis      Pertinent Vitals/Pain Pain Assessment: 0-10 Pain Score: 5  Faces Pain Scale: Hurts little more Pain Location: R hip/pelvis Pain Descriptors / Indicators: Discomfort;Guarding Pain Intervention(s): Monitored during session;Premedicated before session     Hand Dominance     Extremity/Trunk Assessment Upper Extremity Assessment Upper Extremity Assessment: Overall WFL for tasks assessed      Cervical / Trunk Assessment Cervical / Trunk Assessment: Normal   Communication Communication Communication: HOH   Cognition Arousal/Alertness: Awake/alert Behavior During Therapy: WFL for tasks assessed/performed Overall Cognitive Status: Within Functional Limits for tasks assessed  General Comments       Exercises       Shoulder Instructions      Home Living Family/patient expects to be discharged to:: Skilled nursing facility Living Arrangements: Alone                                      Prior Functioning/Environment Level of Independence: Independent with assistive device(s)        Comments: walker in the house and cane outside    OT Diagnosis: Generalized weakness;Acute pain   OT Problem List:  Decreased strength;Decreased activity tolerance;Decreased knowledge of use of DME or AE;Pain;Impaired balance (sitting and/or standing)   OT Treatment/Interventions: Self-care/ADL training;DME and/or AE instruction;Patient/family education;Balance training;Therapeutic activities    OT Goals(Current goals can be found in the care plan section) Acute Rehab OT Goals Patient Stated Goal: to get back home OT Goal Formulation: With patient Time For Goal Achievement: 09/25/14 Potential to Achieve Goals: Good ADL Goals Pt Will Perform Lower Body Bathing: with min assist;with adaptive equipment;sit to/from stand Pt Will Perform Lower Body Dressing: with min assist;with adaptive equipment;sit to/from stand Pt Will Transfer to Toilet: with min guard assist;stand pivot transfer;bedside commode Pt Will Perform Toileting - Clothing Manipulation and hygiene: with min guard assist;sit to/from stand  OT Frequency: Min 2X/week   Barriers to D/C:            Co-evaluation PT/OT/SLP Co-Evaluation/Treatment: Yes Reason for Co-Treatment: Complexity of the patient's impairments (multi-system involvement);For patient/therapist safety PT goals addressed during session: Mobility/safety with mobility OT goals addressed during session: ADL's and self-care      End of Session Nurse Communication: Mobility status  Activity Tolerance: Patient tolerated treatment well Patient left: in chair;with call bell/phone within reach;with chair alarm set   Time: [phone] OT Time Calculation (min): 19 min Charges:  OT General Charges $OT Visit: 1 Procedure OT Evaluation $Initial OT Evaluation Tier I: 1 Procedure G-Codes: OT G-codes **NOT FOR INPATIENT CLASS** Functional Assessment Tool Used: clinical judgment Functional Limitation: Self care Self Care Current Status (Q8250): At least 80 percent but less than 100 percent impaired, limited or restricted Self Care Goal Status (I3704): At least 20 percent but less than  40 percent impaired, limited or restricted  [facility] 09/18/2014, 9:32 AM  Lesle Chris, OTR/L [phone] 09/18/2014

## 2014-09-18 NOTE — Progress Notes (Signed)
Report called to Adonis Huguenin at [facility].

## 2014-09-19 ENCOUNTER — Non-Acute Institutional Stay (SKILLED_NURSING_FACILITY): Payer: Medicare Other | Admitting: Adult Health

## 2014-09-19 ENCOUNTER — Encounter: Payer: Self-pay | Admitting: Adult Health

## 2014-09-19 DIAGNOSIS — E785 Hyperlipidemia, unspecified: Secondary | ICD-10-CM

## 2014-09-19 DIAGNOSIS — I4891 Unspecified atrial fibrillation: Secondary | ICD-10-CM

## 2014-09-19 DIAGNOSIS — K5900 Constipation, unspecified: Secondary | ICD-10-CM

## 2014-09-19 DIAGNOSIS — S3210XS Unspecified fracture of sacrum, sequela: Secondary | ICD-10-CM | POA: Diagnosis not present

## 2014-09-19 DIAGNOSIS — E119 Type 2 diabetes mellitus without complications: Secondary | ICD-10-CM | POA: Diagnosis not present

## 2014-09-19 DIAGNOSIS — I10 Essential (primary) hypertension: Secondary | ICD-10-CM

## 2014-09-20 ENCOUNTER — Non-Acute Institutional Stay (SKILLED_NURSING_FACILITY): Payer: Medicare Other | Admitting: Internal Medicine

## 2014-09-20 DIAGNOSIS — E119 Type 2 diabetes mellitus without complications: Secondary | ICD-10-CM

## 2014-09-20 DIAGNOSIS — K5900 Constipation, unspecified: Secondary | ICD-10-CM | POA: Diagnosis not present

## 2014-09-20 DIAGNOSIS — S3210XS Unspecified fracture of sacrum, sequela: Secondary | ICD-10-CM

## 2014-09-20 DIAGNOSIS — E785 Hyperlipidemia, unspecified: Secondary | ICD-10-CM

## 2014-09-20 DIAGNOSIS — N183 Chronic kidney disease, stage 3 unspecified: Secondary | ICD-10-CM | POA: Insufficient documentation

## 2014-09-20 DIAGNOSIS — F419 Anxiety disorder, unspecified: Secondary | ICD-10-CM

## 2014-09-20 DIAGNOSIS — I4891 Unspecified atrial fibrillation: Secondary | ICD-10-CM

## 2014-09-20 DIAGNOSIS — I10 Essential (primary) hypertension: Secondary | ICD-10-CM

## 2014-09-20 NOTE — Progress Notes (Signed)
Patient ID: Janice [name], female   DOB: 08-06-1931, 81 y.o.   MRN: [id number]     [facility]   PCP: Vidal Schwalbe, MD  Code Status: full code  Allergies  Allergen Reactions  . Demerol [Meperidine] Nausea Only    And vertigo  . Meperidine Hcl     REACTION: nausea and vomiting    Chief Complaint  Patient presents with  . New Admit To SNF     HPI:  81 year old patient is here for short term rehabilitation post hospital admission from 09/16/14-09/18/14 with closed sacral fracture and right superior and inferior pubic ramus fracture with iliopsoas tendon rupture. She was seen by orthopedics and conservative management with pain medication and therapy was recommended. She has PMH of afib, HTN, DM, HLD. She is seen in her room today. Her pain medication has been helping with the pain. Movement exacerbates it. Denies any other concerns. No new concern from staff.   Review of Systems:  Constitutional: Negative for fever, chills, diaphoresis.  HENT: Negative for headache, congestion, nasal discharge Eyes: Negative for eye pain, blurred vision, double vision and discharge.  Respiratory: Negative for cough, shortness of breath and wheezing.   Cardiovascular: Negative for chest pain, palpitations, leg swelling.  Gastrointestinal: Negative for heartburn, nausea, vomiting, abdominal pain. Had bowel movement this am Genitourinary: Negative for dysuria  Musculoskeletal: Negative for falls in the facility Skin: Negative for itching, rash.  Neurological: Negative for dizziness, tingling, focal weakness Psychiatric/Behavioral: Negative for depression   Past Medical History  Diagnosis Date  . Atrial fibrillation   . Diabetes mellitus   . Hypertension   . Hyperlipidemia   . Chest pain   . Allergic rhinitis   . Obesity   . Osteoarthritis   . Cataracts, bilateral   . Endometrial cancer   . GERD (gastroesophageal reflux disease)   . CKD (chronic  kidney disease)   . Lymphedema    Past Surgical History  Procedure Laterality Date  . Cardiac catheterization  06/2009    REVEALED SMOOTH AND NORMAL CORONARY ARTERIES  . Back surgery    . Cholecystectomy    . Vaginal hysterectomy    . Appendectomy     Social History:   reports that she has never smoked. She has never used smokeless tobacco. She reports that she does not drink alcohol or use illicit drugs.  Family History  Problem Relation Age of Onset  . Colon cancer Mother   . Kidney cancer Father   . Lung cancer Sister   . Colon cancer Sister     x2    Medications: Patient's Medications  New Prescriptions   No medications on file  Previous Medications   ALPRAZOLAM (XANAX) 0.25 MG TABLET    Take 0.25 mg by mouth daily as needed for anxiety.    ALPRAZOLAM (XANAX) 0.25 MG TABLET    Take 1 tablet (0.25 mg total) by mouth daily as needed for anxiety.   AMLODIPINE (NORVASC) 2.5 MG TABLET    Take 2.5 mg by mouth daily.   ASPIRIN 81 MG TABLET    Take 81 mg by mouth daily.     CALCITONIN, SALMON, (MIACALCIN/FORTICAL) 200 UNIT/ACT NASAL SPRAY    Place 1 spray into alternate nostrils daily.   CYANOCOBALAMIN (VITAMIN B-12) 500 MCG SUBL    Place 1 tablet under the tongue daily.   IRBESARTAN (AVAPRO) 300 MG TABLET    Take 300 mg by mouth daily.    LUTEIN  PO    Take 1 tablet by mouth daily.   METOPROLOL (LOPRESSOR) 50 MG TABLET    Take 50 mg by mouth 2 (two) times daily.    NITROGLYCERIN (NITROSTAT) 0.4 MG SL TABLET    Place 0.4 mg under the tongue every 5 (five) minutes as needed.     OXYCODONE (ROXICODONE) 5 MG IMMEDIATE RELEASE TABLET    Take 1 tablet (5 mg total) by mouth every 4 (four) hours as needed for severe pain.   POLYETHYLENE GLYCOL (MIRALAX / GLYCOLAX) PACKET    Take 17 g by mouth daily as needed for mild constipation.    SENNA (SENOKOT) 8.6 MG TABS TABLET    Take 1 tablet (8.6 mg total) by mouth 2 (two) times daily.   SIMVASTATIN (ZOCOR) 40 MG TABLET    Take 20 mg by mouth  daily at 6 PM.   WARFARIN (COUMADIN) 2.5 MG TABLET    Take 1.25-2.5 mg by mouth daily. Take 1.25 mg on Tuesday, Wednesday, Friday, Saturday, Sunday and 2.5 mg on Mondays and Thursdays per Dr. Orest Dikes office  Modified Medications   No medications on file  Discontinued Medications   No medications on file     Physical Exam: Filed Vitals:   09/20/14 1232  BP: 126/60  Pulse: 65  Temp: 97.6 F (36.4 C)  Resp: 18    General- elderly female, in no acute distress Head- normocephalic, atraumatic Throat- moist mucus membrane Eyes- PERRLA, EOMI, no pallor, no icterus, no discharge, normal conjunctiva, normal sclera Neck- no cervical lymphadenopathy Cardiovascular- normal s1,s2, no murmurs, palpable dorsalis pedis and radial pulses, no leg edema Respiratory- bilateral clear to auscultation, no wheeze, no rhonchi, no crackles, no use of accessory muscles Abdomen- bowel sounds present, soft, non tender Musculoskeletal- unable to move RLE in lying position, able to move all other extremities Neurological- no focal deficit Skin- warm and dry Psychiatry- alert and oriented to person, place and time, normal mood and affect    Labs reviewed: Basic Metabolic Panel:  Recent Labs  09/16/14 1706 09/17/14 0520  NA 138 139  K 3.9 4.1  CL 102 104  CO2 26 27  GLUCOSE 85 110*  BUN 28* 25*  CREATININE 1.17* 1.15*  CALCIUM 9.1 8.9   Liver Function Tests:  Recent Labs  09/17/14 0520  AST 26  ALT 17  ALKPHOS 102  BILITOT 1.0  PROT 6.2*  ALBUMIN 3.4*   No results for input(s): LIPASE, AMYLASE in the last 8760 hours. No results for input(s): AMMONIA in the last 8760 hours. CBC:  Recent Labs  09/16/14 1706 09/17/14 0520  WBC 10.7* 7.8  NEUTROABS 6.8 4.7  HGB 14.3 12.7  HCT 43.4 38.9  MCV 94.8 93.7  PLT 162 133*   Cardiac Enzymes: No results for input(s): CKTOTAL, CKMB, CKMBINDEX, TROPONINI in the last 8760 hours. BNP: Invalid input(s): POCBNP CBG:  Recent Labs   09/17/14 2131 09/18/14 0704 09/18/14 1132  GLUCAP 73 117* 110*    Radiological Exams:  Ct Head Wo Contrast  09/16/2014   CLINICAL DATA:  Initial encounter for fall without head trauma. History of atrial fibrillation. Diabetes.  EXAM: CT HEAD WITHOUT CONTRAST  CT CERVICAL SPINE WITHOUT CONTRAST  TECHNIQUE: Multidetector CT imaging of the head and cervical spine was performed following the standard protocol without intravenous contrast. Multiplanar CT image reconstructions of the cervical spine were also generated.  COMPARISON:  Head CT of 08/12/2009. No prior cervical spine imaging.  FINDINGS: CT HEAD FINDINGS  Sinuses/Soft tissues:  No significant soft tissue swelling. Hyperostosis frontalis interna. Clear paranasal sinuses and mastoid air cells. No skull fracture.  Intracranial: Expected cerebral volume loss for age. Mild low density in the periventricular white matter likely related to small vessel disease. No mass lesion, hemorrhage, hydrocephalus, acute infarct, intra-axial, or extra-axial fluid collection.  CT CERVICAL SPINE FINDINGS  Spinal visualization through the mid T1 level. Prevertebral soft tissues are within normal limits. Subcentimeter left thyroid nodule which is nonspecific. No apical pneumothorax. Marked spondylosis. Prominent osteophytes at C4 through C7. At C4-5, right neural foraminal and central canal stenosis. At C5-6, there is severe central canal and bilateral neural foraminal narrowing. Straightening of expected lordosis with maintenance of vertebral body height. Heterogeneous osteopenia. Advanced facet arthropathy at multiple levels, most marked in the lower cervical spine. Coronal reformats demonstrate only degenerative change involving the C1-2 articulation.  IMPRESSION: 1.  No acute intracranial abnormality. 2.  Cerebral atrophy and small vessel ischemic change. 3. Advanced cervical spondylosis, without acute fracture or subluxation. This results in central canal and neural  foraminal narrowing, which could predispose the patient to cord injury in the setting of trauma. 4. Straightening of expected cervical lordosis could be positional, due to muscular spasm, or ligamentous injury.   Electronically Signed   By: Abigail Miyamoto M.D.   On: 09/16/2014 17:33   Ct Cervical Spine Wo Contrast  09/16/2014   CLINICAL DATA:  Initial encounter for fall without head trauma. History of atrial fibrillation. Diabetes.  EXAM: CT HEAD WITHOUT CONTRAST  CT CERVICAL SPINE WITHOUT CONTRAST  TECHNIQUE: Multidetector CT imaging of the head and cervical spine was performed following the standard protocol without intravenous contrast. Multiplanar CT image reconstructions of the cervical spine were also generated.  COMPARISON:  Head CT of 08/12/2009. No prior cervical spine imaging.  FINDINGS: CT HEAD FINDINGS  Sinuses/Soft tissues: No significant soft tissue swelling. Hyperostosis frontalis interna. Clear paranasal sinuses and mastoid air cells. No skull fracture.  Intracranial: Expected cerebral volume loss for age. Mild low density in the periventricular white matter likely related to small vessel disease. No mass lesion, hemorrhage, hydrocephalus, acute infarct, intra-axial, or extra-axial fluid collection.  CT CERVICAL SPINE FINDINGS  Spinal visualization through the mid T1 level. Prevertebral soft tissues are within normal limits. Subcentimeter left thyroid nodule which is nonspecific. No apical pneumothorax. Marked spondylosis. Prominent osteophytes at C4 through C7. At C4-5, right neural foraminal and central canal stenosis. At C5-6, there is severe central canal and bilateral neural foraminal narrowing. Straightening of expected lordosis with maintenance of vertebral body height. Heterogeneous osteopenia. Advanced facet arthropathy at multiple levels, most marked in the lower cervical spine. Coronal reformats demonstrate only degenerative change involving the C1-2 articulation.  IMPRESSION: 1.  No acute  intracranial abnormality. 2.  Cerebral atrophy and small vessel ischemic change. 3. Advanced cervical spondylosis, without acute fracture or subluxation. This results in central canal and neural foraminal narrowing, which could predispose the patient to cord injury in the setting of trauma. 4. Straightening of expected cervical lordosis could be positional, due to muscular spasm, or ligamentous injury.   Electronically Signed   By: Abigail Miyamoto M.D.   On: 09/16/2014 17:33   Mr Hip Right Wo Contrast  09/17/2014   CLINICAL DATA:  Right hip pain since a fall on stairs that gave way.  EXAM: MR OF THE RIGHT HIP WITHOUT CONTRAST  TECHNIQUE: Multiplanar, multisequence MR imaging was performed. No intravenous contrast was administered.  COMPARISON:  Radiographs dated 09/16/2014  FINDINGS: Bones: There  is a vertical fracture through the right sacral ala. There also a nondisplaced fractures of right superior pubic ramus at the pubic body and of the right inferior pubic ramus. There is disruption of the distal right iliopsoas tendon with 3.5 cm of proximal retraction. There is also edema in the right external obturator muscle.  Soft tissue contusion in the subcutaneous fat lateral to the left hip.  There is a mild to moderate osteoarthritis of both hips. No joint effusions. No bursitis. No adenopathy or mass lesions.  IMPRESSION: 1. Fractures of the right sacral ala and of the right inferior and superior pubic rami. 2. Rupture of the distal iliopsoas tendon with proximal retraction. 3. Strain of the right external obturator muscle.   Electronically Signed   By: Lorriane Shire M.D.   On: 09/17/2014 08:04   Dg Hips Bilat With Pelvis 3-4 Views  09/16/2014   CLINICAL DATA:  [place] through the stairs today at her sisters house. Bilateral hip pain, right worse than left.  EXAM: BILATERAL HIP (WITH PELVIS) 3-4 VIEWS  COMPARISON:  None.  FINDINGS: No evidence of pelvic fracture. Left hip shows mild osteoarthritis with marginal  osteophytes. The right hip shows more advanced osteoarthritis with more extensive marginal osteophytes. There is also calcification between the femur and the ischium probably related to old injury with heterotopic change.  IMPRESSION: No acute traumatic finding. Osteoarthritis of both hips. Heterotopic calcification between the right femur in the ischium probably related to old injury.   Electronically Signed   By: Nelson Chimes M.D.   On: 09/16/2014 17:55    Assessment/Plan  Closed right pelvis fracture continue oxycodone 5 mg q4h prn pain and touchdown weightbearing on RLE. Bedrest with mobilization from bed to chair recommended in 1-2 weeks. Will have patient work with PT/OT as tolerated to regain strength and restore function.  Fall precautions are in place. Continue coumadin for dvt prophylaxis. Has follow up with orthopedics  afib Rate controlled. Continue metoprolol 50 mg bid with coumadin with goal inr 2-3. Monitor for signs of bleed. Fall precautions  Essential hypertension  Stable bp reading, monitor bp. continue amlodipine 2.5 mg daily, metoprolol 50 mg bid and Avapro 300 mg daily  Type 2 diabetes mellitus hemoglobin A1c 6.0. Diet controlled.   Hyperlipidemia continue Zocor 20 mg daily  Constipation Stable, continue miralax daily with senna s bid and monitor.   ckd stage 3 Monitor renal function, avoid NSAIDs  Chronic Anxiety Calm at present. continue Xanax 0.25 mg daily with Xanax 0.25 mg daily prn and monitor  Goals of care: short term rehabilitation    Labs/tests ordered  Family/ staff Communication: reviewed care plan with patient and nursing supervisor    Blanchie Serve, MD  [place] [phone] (Monday-Friday 8 am - 5 pm) [phone] (afterhours)

## 2014-09-20 NOTE — Progress Notes (Signed)
Patient ID: Janice [name], female   DOB: [birth year], 81 y.o.   MRN: [id number]   09/19/14  Facility:  Nursing Home Location:  [place] Room Number: 106-P LEVEL OF CARE:  SNF (31)   Chief Complaint  Patient presents with  . Hospitalization Follow-up    Closed right sacral fracture, hypertension, type 2 diabetes mellitus, hyperlipidemia, atrial fibrillation, anxiety and constipation    HISTORY OF PRESENT ILLNESS:  This is an 81 year old female who was been admitted to [facility] on 09/18/14 from [facility]. She has PMH of diabetes mellitus, hypertension, hyperlipidemia, GERD, chronic lymphedema and atrial fibrillation on Coumadin. She had a mechanical fall resulting in nondisplaced right superior and inferior pubic rami fractures with nondisplaced right vertical sacral fracture. Orthopedics was consulted and recommended bedrest and Atrovent wall mobilization from bed to chair after 1-2 weeks and over the following 4-6 weeks, weightbearing.  She has been admitted for a short-term rehabilitation.  PAST MEDICAL HISTORY:  Past Medical History  Diagnosis Date  . Atrial fibrillation   . Diabetes mellitus   . Hypertension   . Hyperlipidemia   . Chest pain   . Allergic rhinitis   . Obesity   . Osteoarthritis   . Cataracts, bilateral   . Endometrial cancer   . GERD (gastroesophageal reflux disease)   . CKD (chronic kidney disease)   . Lymphedema     CURRENT MEDICATIONS: Reviewed per MAR/see medication list  Allergies  Allergen Reactions  . Demerol [Meperidine] Nausea Only    And vertigo  . Meperidine Hcl     REACTION: nausea and vomiting     REVIEW OF SYSTEMS:  GENERAL: no change in appetite, no fatigue, no weight changes, no fever, chills or weakness RESPIRATORY: no cough, SOB, DOE, wheezing, hemoptysis CARDIAC: no chest pain, or palpitations GI: no abdominal pain, diarrhea, heart burn, nausea or vomiting,  +constipation  PHYSICAL EXAMINATION  GENERAL: no acute distress, normal body habitus SKIN:  Bruising on left buttock EYES: conjunctivae normal, sclerae normal, normal eye lids NECK: supple, trachea midline, no neck masses, no thyroid tenderness, no thyromegaly LYMPHATICS: no LAN in the neck, no supraclavicular LAN RESPIRATORY: breathing is even & unlabored, BS CTAB CARDIAC: RRR, no murmur,no extra heart sounds, LLE edema 2+ GI: abdomen soft, normal BS, no masses, no tenderness, no hepatomegaly, no splenomegaly EXTREMITIES: Able to move 4 extremities PSYCHIATRIC: the patient is alert & oriented to person, affect & behavior appropriate  LABS/RADIOLOGY: Labs reviewed: Basic Metabolic Panel:  Recent Labs  09/16/14 1706 09/17/14 0520  NA 138 139  K 3.9 4.1  CL 102 104  CO2 26 27  GLUCOSE 85 110*  BUN 28* 25*  CREATININE 1.17* 1.15*  CALCIUM 9.1 8.9   Liver Function Tests:  Recent Labs  09/17/14 0520  AST 26  ALT 17  ALKPHOS 102  BILITOT 1.0  PROT 6.2*  ALBUMIN 3.4*   CBC:  Recent Labs  09/16/14 1706 09/17/14 0520  WBC 10.7* 7.8  NEUTROABS 6.8 4.7  HGB 14.3 12.7  HCT 43.4 38.9  MCV 94.8 93.7  PLT 162 133*   CBG:  Recent Labs  09/17/14 2131 09/18/14 0704 09/18/14 1132  GLUCAP 73 117* 110*    Ct Head Wo Contrast  09/16/2014   CLINICAL DATA:  Initial encounter for fall without head trauma. History of atrial fibrillation. Diabetes.  EXAM: CT HEAD WITHOUT CONTRAST  CT CERVICAL SPINE WITHOUT CONTRAST  TECHNIQUE: Multidetector CT imaging of the head  and cervical spine was performed following the standard protocol without intravenous contrast. Multiplanar CT image reconstructions of the cervical spine were also generated.  COMPARISON:  Head CT of 08/12/2009. No prior cervical spine imaging.  FINDINGS: CT HEAD FINDINGS  Sinuses/Soft tissues: No significant soft tissue swelling. Hyperostosis frontalis interna. Clear paranasal sinuses and mastoid air cells. No skull  fracture.  Intracranial: Expected cerebral volume loss for age. Mild low density in the periventricular white matter likely related to small vessel disease. No mass lesion, hemorrhage, hydrocephalus, acute infarct, intra-axial, or extra-axial fluid collection.  CT CERVICAL SPINE FINDINGS  Spinal visualization through the mid T1 level. Prevertebral soft tissues are within normal limits. Subcentimeter left thyroid nodule which is nonspecific. No apical pneumothorax. Marked spondylosis. Prominent osteophytes at C4 through C7. At C4-5, right neural foraminal and central canal stenosis. At C5-6, there is severe central canal and bilateral neural foraminal narrowing. Straightening of expected lordosis with maintenance of vertebral body height. Heterogeneous osteopenia. Advanced facet arthropathy at multiple levels, most marked in the lower cervical spine. Coronal reformats demonstrate only degenerative change involving the C1-2 articulation.  IMPRESSION: 1.  No acute intracranial abnormality. 2.  Cerebral atrophy and small vessel ischemic change. 3. Advanced cervical spondylosis, without acute fracture or subluxation. This results in central canal and neural foraminal narrowing, which could predispose the patient to cord injury in the setting of trauma. 4. Straightening of expected cervical lordosis could be positional, due to muscular spasm, or ligamentous injury.   Electronically Signed   By: Abigail Miyamoto M.D.   On: 09/16/2014 17:33   Ct Cervical Spine Wo Contrast  09/16/2014   CLINICAL DATA:  Initial encounter for fall without head trauma. History of atrial fibrillation. Diabetes.  EXAM: CT HEAD WITHOUT CONTRAST  CT CERVICAL SPINE WITHOUT CONTRAST  TECHNIQUE: Multidetector CT imaging of the head and cervical spine was performed following the standard protocol without intravenous contrast. Multiplanar CT image reconstructions of the cervical spine were also generated.  COMPARISON:  Head CT of 08/12/2009. No prior  cervical spine imaging.  FINDINGS: CT HEAD FINDINGS  Sinuses/Soft tissues: No significant soft tissue swelling. Hyperostosis frontalis interna. Clear paranasal sinuses and mastoid air cells. No skull fracture.  Intracranial: Expected cerebral volume loss for age. Mild low density in the periventricular white matter likely related to small vessel disease. No mass lesion, hemorrhage, hydrocephalus, acute infarct, intra-axial, or extra-axial fluid collection.  CT CERVICAL SPINE FINDINGS  Spinal visualization through the mid T1 level. Prevertebral soft tissues are within normal limits. Subcentimeter left thyroid nodule which is nonspecific. No apical pneumothorax. Marked spondylosis. Prominent osteophytes at C4 through C7. At C4-5, right neural foraminal and central canal stenosis. At C5-6, there is severe central canal and bilateral neural foraminal narrowing. Straightening of expected lordosis with maintenance of vertebral body height. Heterogeneous osteopenia. Advanced facet arthropathy at multiple levels, most marked in the lower cervical spine. Coronal reformats demonstrate only degenerative change involving the C1-2 articulation.  IMPRESSION: 1.  No acute intracranial abnormality. 2.  Cerebral atrophy and small vessel ischemic change. 3. Advanced cervical spondylosis, without acute fracture or subluxation. This results in central canal and neural foraminal narrowing, which could predispose the patient to cord injury in the setting of trauma. 4. Straightening of expected cervical lordosis could be positional, due to muscular spasm, or ligamentous injury.   Electronically Signed   By: Abigail Miyamoto M.D.   On: 09/16/2014 17:33   Mr Hip Right Wo Contrast  09/17/2014   CLINICAL DATA:  Right hip pain since a fall on stairs that gave way.  EXAM: MR OF THE RIGHT HIP WITHOUT CONTRAST  TECHNIQUE: Multiplanar, multisequence MR imaging was performed. No intravenous contrast was administered.  COMPARISON:  Radiographs dated  09/16/2014  FINDINGS: Bones: There is a vertical fracture through the right sacral ala. There also a nondisplaced fractures of right superior pubic ramus at the pubic body and of the right inferior pubic ramus. There is disruption of the distal right iliopsoas tendon with 3.5 cm of proximal retraction. There is also edema in the right external obturator muscle.  Soft tissue contusion in the subcutaneous fat lateral to the left hip.  There is a mild to moderate osteoarthritis of both hips. No joint effusions. No bursitis. No adenopathy or mass lesions.  IMPRESSION: 1. Fractures of the right sacral ala and of the right inferior and superior pubic rami. 2. Rupture of the distal iliopsoas tendon with proximal retraction. 3. Strain of the right external obturator muscle.   Electronically Signed   By: Lorriane Shire M.D.   On: 09/17/2014 08:04   Dg Hips Bilat With Pelvis 3-4 Views  09/16/2014   CLINICAL DATA:  [place] through the stairs today at her sisters house. Bilateral hip pain, right worse than left.  EXAM: BILATERAL HIP (WITH PELVIS) 3-4 VIEWS  COMPARISON:  None.  FINDINGS: No evidence of pelvic fracture. Left hip shows mild osteoarthritis with marginal osteophytes. The right hip shows more advanced osteoarthritis with more extensive marginal osteophytes. There is also calcification between the femur and the ischium probably related to old injury with heterotopic change.  IMPRESSION: No acute traumatic finding. Osteoarthritis of both hips. Heterotopic calcification between the right femur in the ischium probably related to old injury.   Electronically Signed   By: Nelson Chimes M.D.   On: 09/16/2014 17:55    ASSESSMENT/PLAN:  Closed right sacral fracture - for rehabilitation; continue oxycodone 5 mg 1 tab by mouth every 4 hours when necessary for pain; and touchdown weightbearing on BLE  Essential hypertension - well controlled; continue amlodipine 2.5 mg 1 tab by mouth daily, metoprolol 50 mg 1 tab by mouth  twice a day and Avapro 300 mg 1 tab by mouth daily  Type 2 diabetes mellitus - hemoglobin A1c 6.0; continue Avapro 300 mg 1 tab by mouth daily  Hyperlipidemia - continue Zocor 20 mg 1 tab by mouth daily  Atrial fibrillation - rate controlled; continue metoprolol 50 mg 1 tab by mouth twice a day and Coumadin 1.25 mg by mouth every T-W-F-Sat-Sun and 2.5 mg by mouth every M-Th   Anxiety - mood is stable; continue Xanax 0.25 mg 1 tab by mouth daily when necessary and start Xanax 0.25 mg 1 tab by mouth daily  Constipation - start MiraLAX 17 g +4-6 ounces liquid by mouth daily and continue senna 8.6 mg 1 tab by mouth twice a day   Goals of care:  Short-term rehabilitation/ long-term care   Labs/test ordered:  CBC and BMP in 1 week  Spent 50 minutes in patient care.    [facility], NP [employer] [phone]

## 2014-09-27 ENCOUNTER — Encounter: Payer: Self-pay | Admitting: Adult Health

## 2014-09-27 ENCOUNTER — Non-Acute Institutional Stay (SKILLED_NURSING_FACILITY): Payer: Medicare Other | Admitting: Adult Health

## 2014-09-27 DIAGNOSIS — Z7901 Long term (current) use of anticoagulants: Secondary | ICD-10-CM | POA: Diagnosis not present

## 2014-09-27 DIAGNOSIS — I4891 Unspecified atrial fibrillation: Secondary | ICD-10-CM

## 2014-09-27 NOTE — Progress Notes (Signed)
Patient ID: Janice [name], female   DOB: 1931/07/25, 81 y.o.   MRN: [id number] Subjective:     Indication: atrial fibrillation Bleeding signs/symptoms: None Thromboembolic signs/symptoms: None  Missed Coumadin doses: held X 1 day Medication changes: no Dietary changes: no Bacterial/viral infection: no Other concerns: no  Review of Systems A comprehensive review of systems was negative.   Objective:    INR Today: 3.0; previous Coumadin 2.7, 3.7 (supratherapeutic) Current dose: Coumadin 2.5 mg daily  Assessment:    Therapeutic INR for goal of 2-3   Plan:    1. New dose: decrease Coumadin to 2 mg PO Q D   2. Next INR:   10/01/14

## 2014-10-10 ENCOUNTER — Encounter: Payer: Self-pay | Admitting: Adult Health

## 2014-10-10 ENCOUNTER — Non-Acute Institutional Stay (SKILLED_NURSING_FACILITY): Payer: Medicare Other | Admitting: Adult Health

## 2014-10-10 DIAGNOSIS — E119 Type 2 diabetes mellitus without complications: Secondary | ICD-10-CM | POA: Diagnosis not present

## 2014-10-10 DIAGNOSIS — R600 Localized edema: Secondary | ICD-10-CM

## 2014-10-10 DIAGNOSIS — E785 Hyperlipidemia, unspecified: Secondary | ICD-10-CM | POA: Diagnosis not present

## 2014-10-10 DIAGNOSIS — F419 Anxiety disorder, unspecified: Secondary | ICD-10-CM

## 2014-10-10 DIAGNOSIS — I10 Essential (primary) hypertension: Secondary | ICD-10-CM | POA: Diagnosis not present

## 2014-10-10 DIAGNOSIS — S3210XS Unspecified fracture of sacrum, sequela: Secondary | ICD-10-CM

## 2014-10-10 DIAGNOSIS — I4891 Unspecified atrial fibrillation: Secondary | ICD-10-CM | POA: Diagnosis not present

## 2014-10-10 DIAGNOSIS — K5900 Constipation, unspecified: Secondary | ICD-10-CM | POA: Diagnosis not present

## 2014-11-11 ENCOUNTER — Other Ambulatory Visit: Payer: Self-pay | Admitting: Sports Medicine

## 2014-11-11 ENCOUNTER — Ambulatory Visit
Admission: RE | Admit: 2014-11-11 | Discharge: 2014-11-11 | Disposition: A | Discharge status: Home / Self Care | Payer: Medicare Other | Source: Ambulatory Visit | Attending: Sports Medicine | Admitting: Sports Medicine

## 2014-11-11 DIAGNOSIS — R52 Pain, unspecified: Secondary | ICD-10-CM

## 2014-11-11 DIAGNOSIS — M25552 Pain in left hip: Principal | ICD-10-CM

## 2014-11-11 DIAGNOSIS — M25551 Pain in right hip: Secondary | ICD-10-CM

## 2014-12-08 NOTE — Progress Notes (Signed)
Patient ID: Janice [name], female   DOB: 1931-05-24, 81 y.o.   MRN: [id number]   10/10/14  Facility:  Nursing Home Location:  [place] Room Number: [id number] LEVEL OF CARE:  SNF (31)   Chief Complaint  Patient presents with  . Discharge Note    Closed right sacral fracture, hypertension, type 2 diabetes mellitus, hyperlipidemia, atrial fibrillation, anxiety and constipation    HISTORY OF PRESENT ILLNESS:  This is an 81 year old female who is for discharge home with Home health PT for endurance, OT for ADLs, CNA for showers and Nursing for medication management. DME:  Bedside commode. She has been admitted to [facility] on 09/18/14 from [facility]. She has PMH of diabetes mellitus, hypertension, hyperlipidemia, GERD, chronic lymphedema and atrial fibrillation on Coumadin. She had a mechanical fall resulting in nondisplaced right superior and inferior pubic rami fractures with nondisplaced right vertical sacral fracture. Orthopedics was consulted and recommended bedrest and eventual mobilization from bed to chair after 1-2 weeks and over the following 4-6 weeks, weightbearing.  Patient was admitted to this facility for short-term rehabilitation after the patient's recent hospitalization.  Patient has completed SNF rehabilitation and therapy has cleared the patient for discharge.   PAST MEDICAL HISTORY:  Past Medical History  Diagnosis Date  . Atrial fibrillation   . Diabetes mellitus   . Hypertension   . Hyperlipidemia   . Chest pain   . Allergic rhinitis   . Obesity   . Osteoarthritis   . Cataracts, bilateral   . Endometrial cancer   . GERD (gastroesophageal reflux disease)   . CKD (chronic kidney disease)   . Lymphedema     CURRENT MEDICATIONS: Reviewed per MAR/see medication list    Medication List       This list is accurate as of: 10/10/14 11:59 PM.  Always use your most recent med list.               ALPRAZolam 0.25 MG  tablet  Commonly known as:  XANAX  Take 0.25 mg by mouth daily as needed for anxiety. And Xanax 0.25 mg 1 tab PO Q D PRN     ALPRAZolam 0.25 MG tablet  Commonly known as:  XANAX  Take 1 tablet (0.25 mg total) by mouth daily as needed for anxiety.     amLODipine 2.5 MG tablet  Commonly known as:  NORVASC  Take 2.5 mg by mouth daily.     aspirin 81 MG tablet  Take 81 mg by mouth daily.     calcitonin (salmon) 200 UNIT/ACT nasal spray  Commonly known as:  MIACALCIN/FORTICAL  Place 1 spray into alternate nostrils daily.     geriatric multivitamins-minerals Elix  Take 15 mLs by mouth 2 (two) times daily.     irbesartan 300 MG tablet  Commonly known as:  AVAPRO  Take 300 mg by mouth daily.     LUTEIN PO  Take 1 tablet by mouth daily.     metoprolol 50 MG tablet  Commonly known as:  LOPRESSOR  Take 50 mg by mouth 2 (two) times daily.     nitroGLYCERIN 0.4 MG SL tablet  Commonly known as:  NITROSTAT  Place 0.4 mg under the tongue every 5 (five) minutes as needed.     oxyCODONE 5 MG immediate release tablet  Commonly known as:  ROXICODONE  Take 1 tablet (5 mg total) by mouth every 4 (four) hours as needed for severe pain.  polyethylene glycol packet  Commonly known as:  MIRALAX / GLYCOLAX  Take 17 g by mouth daily.     senna 8.6 MG Tabs tablet  Commonly known as:  SENOKOT  Take 1 tablet (8.6 mg total) by mouth 2 (two) times daily.     simvastatin 40 MG tablet  Commonly known as:  ZOCOR  Take 20 mg by mouth daily at 6 PM.     Vitamin B-12 500 MCG Subl  Place 1 tablet under the tongue daily.     warfarin 2.5 MG tablet  Commonly known as:  COUMADIN  Take 1.25-2.5 mg by mouth daily. Take 1.25 mg on Tuesday, Wednesday, Friday, Saturday, Sunday and 2.5 mg on Mondays and Thursdays per Dr. Orest Dikes office         Allergies  Allergen Reactions  . Demerol [Meperidine] Nausea Only    And vertigo  . Meperidine Hcl     REACTION: nausea and vomiting     REVIEW OF  SYSTEMS:  GENERAL: no change in appetite, no fatigue, no weight changes, no fever, chills or weakness RESPIRATORY: no cough, SOB, DOE, wheezing, hemoptysis CARDIAC: no chest pain, or palpitations GI: no abdominal pain, diarrhea, heart burn, nausea or vomiting, +constipation  PHYSICAL EXAMINATION  GENERAL: no acute distress, normal body habitus SKIN:  Bruising on left buttock EYES: conjunctivae normal, sclerae normal, normal eye lids NECK: supple, trachea midline, no neck masses, no thyroid tenderness, no thyromegaly LYMPHATICS: no LAN in the neck, no supraclavicular LAN RESPIRATORY: breathing is even & unlabored, BS CTAB CARDIAC: RRR, no murmur,no extra heart sounds, LLE edema 2+ GI: abdomen soft, normal BS, no masses, no tenderness, no hepatomegaly, no splenomegaly EXTREMITIES: Able to move 4 extremities PSYCHIATRIC: the patient is alert & oriented to person, affect & behavior appropriate  LABS/RADIOLOGY: Labs reviewed: 09/26/14  WBC 7.5 hemoglobin 12.2 hematocrit 35.3 MCV 91.2 platelet count 183 sodium 140 potassium 4.3 glucose 103 BUN 26 creatinine 1.03 calcium 8.3 Basic Metabolic Panel:  Recent Labs  09/16/14 1706 09/17/14 0520  NA 138 139  K 3.9 4.1  CL 102 104  CO2 26 27  GLUCOSE 85 110*  BUN 28* 25*  CREATININE 1.17* 1.15*  CALCIUM 9.1 8.9   Liver Function Tests:  Recent Labs  09/17/14 0520  AST 26  ALT 17  ALKPHOS 102  BILITOT 1.0  PROT 6.2*  ALBUMIN 3.4*   CBC:  Recent Labs  09/16/14 1706 09/17/14 0520  WBC 10.7* 7.8  NEUTROABS 6.8 4.7  HGB 14.3 12.7  HCT 43.4 38.9  MCV 94.8 93.7  PLT 162 133*   CBG:  Recent Labs  09/17/14 2131 09/18/14 0704 09/18/14 1132  GLUCAP 73 117* 110*    Mr Pelvis Wo Contrast  11/11/2014   CLINICAL DATA:  Right sacral and pubic ramus fractures. Right psoas muscle injury in 09/16/2014. Currently with left-sided pain.  EXAM: MRI PELVIS WITHOUT CONTRAST  TECHNIQUE: Multiplanar multisequence MR imaging of the  pelvis was performed. No intravenous contrast was administered.  COMPARISON:  09/16/2014  FINDINGS: Right sacral fracture is still visible with edema along the fracture plane. Fracture the right superior pubic ramus medially and of the inferior pubic ramus medially, extending to involve the pubic body on the right, fractures still visible.  The degree of right hip adductor musculature edema is reduced but not resolved. There is also trace left hip adductor muscular edema.  Bilaterally there is edema tracking deep to the iliacus muscles, and in the psoas muscles, right greater than  left. The right iliopsoas tendon was torn previously and remains indistinct distally.  Hamstring tendons unremarkable.  No hip effusion.  No findings of avascular necrosis or osseous edema in the proximal femurs. Endplate edema at N2-7, probably degenerative, with fusion at L5-S1 which appears solid.  No hip effusion. Degenerative disc disease noted at L4-5 on the parasagittal images, only faintly seen.  IMPRESSION: 1. No new fracture identified. The right-sided pelvic fractures are still visible and still associated with edema. 2. Iliacus and psoas edema bilaterally, but greater on the right than the left. The distal iliopsoas tendon on the right is torn but on the left is intact. There is also low-level edema in both sets of hip adductor musculature. Some of the left-sided muscular edema could be related to strain or altered gait mechanics. 3. Degenerative disc disease at L4-5.   Electronically Signed   By: Van Clines M.D.   On: 11/11/2014 15:25    ASSESSMENT/PLAN:  Closed right sacral fracture - for Home health PT, OT, CNA and Nursing; continue oxycodone 5 mg 1 tab by mouth every 4 hours when necessary for pain  Essential hypertension - well controlled; continue amlodipine 2.5 mg 1 tab by mouth daily, metoprolol 50 mg 1 tab by mouth twice a day and Avapro 300 mg 1 tab by mouth daily  Type 2 diabetes mellitus -  hemoglobin A1c 6.0; continue Avapro 300 mg 1 tab by mouth daily  Hyperlipidemia - continue Zocor 20 mg 1 tab by mouth daily  Atrial fibrillation - rate controlled; continue metoprolol 50 mg 1 tab by mouth twice a day and Coumadin 1.25 mg by mouth every T-W-F-Sat-Sun and 2.5 mg by mouth every M-Th   Anxiety - mood is stable; continue Xanax 0.25 mg 1 tab by mouth daily when necessary and start Xanax 0.25 mg 1 tab by mouth daily  Constipation - start MiraLAX 17 g +4-6 ounces liquid by mouth daily and continue senna 8.6 mg 1 tab by mouth twice a day  BLE edema - start Lasix 20 mg 1 tab PO BID X 2 days then Q D X 1 week     I have filled out patient's discharge paperwork and written prescriptions.  Patient will receive home health PT, OT, Nursing and CNA.  DME provided:  Bedside commode  Total discharge time: Greater than 30 minutes  Discharge time involved coordination of the discharge process with social worker, nursing staff and therapy department. Medical justification for home health services/DME verified.   [facility], NP [employer] [phone]

## 2015-03-04 ENCOUNTER — Other Ambulatory Visit: Payer: Self-pay | Admitting: Family Medicine

## 2015-03-04 DIAGNOSIS — R51 Headache: Principal | ICD-10-CM

## 2015-03-04 DIAGNOSIS — R519 Headache, unspecified: Secondary | ICD-10-CM

## 2015-03-04 DIAGNOSIS — R42 Dizziness and giddiness: Secondary | ICD-10-CM

## 2015-03-05 ENCOUNTER — Ambulatory Visit (HOSPITAL_COMMUNITY)
Admission: RE | Admit: 2015-03-05 | Discharge: 2015-03-05 | Disposition: A | Discharge status: Home / Self Care | Payer: Medicare Other | Source: Ambulatory Visit | Attending: Family Medicine | Admitting: Family Medicine

## 2015-03-05 DIAGNOSIS — R262 Difficulty in walking, not elsewhere classified: Secondary | ICD-10-CM | POA: Insufficient documentation

## 2015-03-05 DIAGNOSIS — Z8542 Personal history of malignant neoplasm of other parts of uterus: Secondary | ICD-10-CM | POA: Diagnosis not present

## 2015-03-05 DIAGNOSIS — R51 Headache: Secondary | ICD-10-CM | POA: Diagnosis present

## 2015-03-05 DIAGNOSIS — R42 Dizziness and giddiness: Secondary | ICD-10-CM | POA: Diagnosis not present

## 2015-03-05 DIAGNOSIS — R410 Disorientation, unspecified: Secondary | ICD-10-CM | POA: Diagnosis not present

## 2015-03-05 DIAGNOSIS — R519 Headache, unspecified: Secondary | ICD-10-CM

## 2015-03-05 LAB — POCT I-STAT CREATININE: Creatinine, Ser: 1.3 mg/dL — ABNORMAL HIGH (ref 0.44–1.00)

## 2015-03-05 MED ORDER — GADOBENATE DIMEGLUMINE 529 MG/ML IV SOLN
10.0000 mL | Freq: Once | INTRAVENOUS | Status: AC | PRN
  Administered 2015-03-05: 8 mL via INTRAVENOUS

## 2015-03-06 ENCOUNTER — Ambulatory Visit (HOSPITAL_COMMUNITY): Admission: RE | Admit: 2015-03-06 | Payer: Medicare Other | Source: Ambulatory Visit

## 2015-05-11 ENCOUNTER — Emergency Department (HOSPITAL_COMMUNITY): Payer: Medicare Other

## 2015-05-11 ENCOUNTER — Emergency Department (HOSPITAL_COMMUNITY)
Admission: EM | Admit: 2015-05-11 | Discharge: 2015-05-11 | Disposition: A | Discharge status: Home / Self Care | Payer: Medicare Other | Attending: Emergency Medicine | Admitting: Emergency Medicine

## 2015-05-11 ENCOUNTER — Encounter (HOSPITAL_COMMUNITY): Payer: Self-pay | Admitting: Family Medicine

## 2015-05-11 DIAGNOSIS — M1990 Unspecified osteoarthritis, unspecified site: Secondary | ICD-10-CM | POA: Insufficient documentation

## 2015-05-11 DIAGNOSIS — Z8542 Personal history of malignant neoplasm of other parts of uterus: Secondary | ICD-10-CM | POA: Insufficient documentation

## 2015-05-11 DIAGNOSIS — R002 Palpitations: Secondary | ICD-10-CM | POA: Insufficient documentation

## 2015-05-11 DIAGNOSIS — R0602 Shortness of breath: Secondary | ICD-10-CM | POA: Insufficient documentation

## 2015-05-11 DIAGNOSIS — Z7901 Long term (current) use of anticoagulants: Secondary | ICD-10-CM | POA: Insufficient documentation

## 2015-05-11 DIAGNOSIS — I129 Hypertensive chronic kidney disease with stage 1 through stage 4 chronic kidney disease, or unspecified chronic kidney disease: Secondary | ICD-10-CM | POA: Insufficient documentation

## 2015-05-11 DIAGNOSIS — E669 Obesity, unspecified: Secondary | ICD-10-CM | POA: Diagnosis not present

## 2015-05-11 DIAGNOSIS — Z79899 Other long term (current) drug therapy: Secondary | ICD-10-CM | POA: Insufficient documentation

## 2015-05-11 DIAGNOSIS — E119 Type 2 diabetes mellitus without complications: Secondary | ICD-10-CM | POA: Insufficient documentation

## 2015-05-11 DIAGNOSIS — N189 Chronic kidney disease, unspecified: Secondary | ICD-10-CM | POA: Diagnosis not present

## 2015-05-11 DIAGNOSIS — Z9889 Other specified postprocedural states: Secondary | ICD-10-CM | POA: Insufficient documentation

## 2015-05-11 DIAGNOSIS — E785 Hyperlipidemia, unspecified: Secondary | ICD-10-CM | POA: Diagnosis not present

## 2015-05-11 DIAGNOSIS — R079 Chest pain, unspecified: Secondary | ICD-10-CM | POA: Insufficient documentation

## 2015-05-11 DIAGNOSIS — Z7982 Long term (current) use of aspirin: Secondary | ICD-10-CM | POA: Insufficient documentation

## 2015-05-11 LAB — BASIC METABOLIC PANEL
Anion gap: 10 (ref 5–15)
BUN: 24 mg/dL — AB (ref 6–20)
CHLORIDE: 103 mmol/L (ref 101–111)
CO2: 28 mmol/L (ref 22–32)
CREATININE: 1.2 mg/dL — AB (ref 0.44–1.00)
Calcium: 9.2 mg/dL (ref 8.9–10.3)
GFR calc Af Amer: 48 mL/min — ABNORMAL LOW (ref >60)
GFR calc non Af Amer: 41 mL/min — ABNORMAL LOW (ref >60)
GLUCOSE: 86 mg/dL (ref 65–99)
POTASSIUM: 4.3 mmol/L (ref 3.5–5.1)
SODIUM: 141 mmol/L (ref 135–145)

## 2015-05-11 LAB — CBC
HEMATOCRIT: 43.9 % (ref 36.0–46.0)
Hemoglobin: 14.6 g/dL (ref 12.0–15.0)
MCH: 31.4 pg (ref 26.0–34.0)
MCHC: 33.3 g/dL (ref 30.0–36.0)
MCV: 94.4 fL (ref 78.0–100.0)
PLATELETS: 171 10*3/uL (ref 150–400)
RBC: 4.65 MIL/uL (ref 3.87–5.11)
RDW: 12.4 % (ref 11.5–15.5)
WBC: 9.6 10*3/uL (ref 4.0–10.5)

## 2015-05-11 LAB — PROTIME-INR
INR: 1.66 — ABNORMAL HIGH (ref 0.00–1.49)
Prothrombin Time: 19.1 seconds — ABNORMAL HIGH (ref 11.6–15.2)

## 2015-05-11 LAB — TROPONIN I: Troponin I: <0.03 ng/mL (ref <0.031)

## 2015-05-11 MED ORDER — ASPIRIN 325 MG PO TABS
325.0000 mg | ORAL_TABLET | Freq: Once | ORAL | Status: AC
  Administered 2015-05-11: 325 mg via ORAL
  Filled 2015-05-11: qty 1

## 2015-05-11 NOTE — ED Notes (Signed)
Pt states that she felt another palpitation when she returned from the restroom

## 2015-05-11 NOTE — ED Notes (Signed)
Patient reports she is experiencing a new, intermittent chest pain that is described as squeezing that last for a few minutes. Pt reports after having one of these chest pain episodes, she becomes weak and "out of it". Pt reports she was cleaning the kitchen yesterday when the pain started.

## 2015-05-11 NOTE — ED Notes (Signed)
Nurse will draw labs. 

## 2015-05-11 NOTE — ED Provider Notes (Signed)
CSN: [id number]     Arrival date & time 05/11/15  1608 History   First MD Initiated Contact with Patient 05/11/15 1626     Chief Complaint  Patient presents with  . Palpitations  . Chest Pain     (Consider location/radiation/quality/duration/timing/severity/associated sxs/prior Treatment) Patient is a 82 y.o. female presenting with palpitations and chest pain.  Palpitations Palpitations quality:  Irregular Onset quality:  Sudden Duration:  2 seconds Chronicity:  New Context: not anxiety   Relieved by:  None tried Worsened by:  Nothing Ineffective treatments:  None tried Associated symptoms: chest pain and shortness of breath   Associated symptoms: no back pain, no nausea, no PND and no vomiting   Chest Pain Associated symptoms: palpitations and shortness of breath   Associated symptoms: no back pain, no nausea, no PND and not vomiting     Past Medical History  Diagnosis Date  . Atrial fibrillation ([place])   . Diabetes mellitus   . Hypertension   . Hyperlipidemia   . Chest pain   . Allergic rhinitis   . Obesity   . Osteoarthritis   . Cataracts, bilateral   . Endometrial cancer ([place])   . GERD (gastroesophageal reflux disease)   . CKD (chronic kidney disease)   . Lymphedema    Past Surgical History  Procedure Laterality Date  . Cardiac catheterization  06/2009    REVEALED SMOOTH AND NORMAL CORONARY ARTERIES  . Back surgery    . Cholecystectomy    . Vaginal hysterectomy    . Appendectomy     Family History  Problem Relation Age of Onset  . Colon cancer Mother   . Kidney cancer Father   . Lung cancer Sister   . Colon cancer Sister     x2   Social History  Substance Use Topics  . Smoking status: Never Smoker   . Smokeless tobacco: Never Used  . Alcohol Use: No   OB History    No data available     Review of Systems  Respiratory: Positive for shortness of breath.   Cardiovascular: Positive for chest pain and palpitations. Negative for PND.   Gastrointestinal: Negative for nausea and vomiting.  Genitourinary: Negative for frequency, hematuria and genital sores.  Musculoskeletal: Negative for back pain.  All other systems reviewed and are negative.     Allergies  Demerol and Meperidine hcl  Home Medications   Prior to Admission medications   Medication Sig Start Date End Date Taking? Authorizing Provider  ALPRAZolam (XANAX) 0.25 MG tablet Take 1 tablet (0.25 mg total) by mouth daily as needed for anxiety. 09/18/14  Yes Donne Hazel, MD  aspirin 81 MG tablet Take 81 mg by mouth daily.     Yes Historical Provider, MD  calcitonin, salmon, (MIACALCIN/FORTICAL) 200 UNIT/ACT nasal spray Place 1 spray into alternate nostrils daily. Patient taking differently: Place 1 spray into alternate nostrils daily as needed (allergies).  09/18/14  Yes Donne Hazel, MD  Cyanocobalamin (VITAMIN B-12) 500 MCG SUBL Place 1 tablet under the tongue daily.   Yes Historical Provider, MD  geriatric multivitamins-minerals (ELDERTONIC/GEVRABON) ELIX Take 15 mLs by mouth 2 (two) times daily.   Yes Historical Provider, MD  LUTEIN PO Take 1 tablet by mouth daily.   Yes Historical Provider, MD  metoprolol (LOPRESSOR) 50 MG tablet Take 50 mg by mouth 2 (two) times daily.    Yes Historical Provider, MD  nitroGLYCERIN (NITROSTAT) 0.4 MG SL tablet Place 0.4 mg under the tongue every 5 (  five) minutes as needed for chest pain.    Yes Historical Provider, MD  oxyCODONE (ROXICODONE) 5 MG immediate release tablet Take 1 tablet (5 mg total) by mouth every 4 (four) hours as needed for severe pain. 09/17/14  Yes Jessy Oto, MD  PARoxetine (PAXIL) 20 MG tablet Take 20 mg by mouth daily.   Yes Historical Provider, MD  polyethylene glycol (MIRALAX / GLYCOLAX) packet Take 17 g by mouth daily.    Yes Historical Provider, MD  simvastatin (ZOCOR) 40 MG tablet Take 20 mg by mouth daily at 6 PM.   Yes Historical Provider, MD  warfarin (COUMADIN) 2.5 MG tablet Take 1.25 mg by  mouth daily.    Yes Historical Provider, MD  amLODipine (NORVASC) 2.5 MG tablet Take 2.5 mg by mouth daily.    Historical Provider, MD  irbesartan (AVAPRO) 300 MG tablet Take 300 mg by mouth daily.  05/23/13   Historical Provider, MD  PARoxetine (PAXIL) 30 MG tablet Take 30 mg by mouth daily. Reported on 05/11/2015 04/07/15   Historical Provider, MD  senna (SENOKOT) 8.6 MG TABS tablet Take 1 tablet (8.6 mg total) by mouth 2 (two) times daily. Patient not taking: Reported on 05/11/2015 09/18/14   Donne Hazel, MD   BP 141/66 mmHg  Pulse 59  Temp(Src) 97.9 F (36.6 C) (Oral)  Resp 14  Ht 5\' 6"  (1.676 m)  Wt 185 lb (83.915 kg)  BMI 29.87 kg/m2  SpO2 96% Physical Exam  Constitutional: She is oriented to person, place, and time. She appears well-developed and well-nourished.  HENT:  Head: Normocephalic and atraumatic.  Neck: Normal range of motion.  Cardiovascular: Normal rate and regular rhythm.   Pulmonary/Chest: Effort normal. No stridor. No respiratory distress.  Abdominal: Soft. Bowel sounds are normal. She exhibits no distension.  Musculoskeletal: Normal range of motion. She exhibits no edema or tenderness.  Neurological: She is alert and oriented to person, place, and time. No cranial nerve deficit. Coordination normal.  Skin: Skin is warm and dry. No rash noted. No erythema.  Nursing note and vitals reviewed.   ED Course  Procedures (including critical care time) Labs Review Labs Reviewed  BASIC METABOLIC PANEL - Abnormal; Notable for the following:    BUN 24 (*)    Creatinine, Ser 1.20 (*)    GFR calc non Af Amer 41 (*)    GFR calc Af Amer 48 (*)    All other components within normal limits  PROTIME-INR - Abnormal; Notable for the following:    Prothrombin Time 19.1 (*)    INR 1.66 (*)    All other components within normal limits  CBC  TROPONIN I  TROPONIN I    Imaging Review Dg Chest 2 View  05/11/2015  CLINICAL DATA:  82 year old presenting with 2 day history of  mid chest pain. Current history of hypertension, diabetes and atrial fibrillation. Nonsmoker. EXAM: CHEST  2 VIEW COMPARISON:  04/01/2014 and earlier including CT chest 09/29/2012. FINDINGS: AP semi-erect and lateral images were obtained. Cardiac silhouette normal in size, unchanged. Thoracic aorta mildly tortuous and atherosclerotic, unchanged. Hilar and mediastinal contours otherwise unremarkable. Minimal scarring in the right middle lobe, unchanged. Lungs otherwise clear. Bronchovascular markings normal. No localized airspace consolidation. No pleural effusions. No pneumothorax. Normal pulmonary vascularity. Degenerative changes and DISH involving the thoracic spine. IMPRESSION: No acute cardiopulmonary disease. Stable minimal right middle lobe scarring. Electronically Signed   By: Evangeline Dakin M.D.   On: 05/11/2015 16:51   I have  personally reviewed and evaluated these images and lab results as part of my medical decision-making.   EKG Interpretation   Date/Time:  Sunday May 11 2015 16:14:48 EST Ventricular Rate:  63 PR Interval:  162 QRS Duration: 76 QT Interval:  423 QTC Calculation: 433 R Axis:   34 Text Interpretation:  Sinus rhythm Abnormal R-wave progression, early  transition No significant change since last tracing  compared to 06/23/13  Confirmed by [facility] MD, Corene Cornea [phone]) on 05/11/2015 4:27:40 PM      MDM   Final diagnoses:  Palpitations   Likely palpitations, however has HEART score of 4 so will likely need obs for rule out.   First troponin negative. No arrhythmia in ED. Will obs and get second troponin.   Second negative, cp free. Atypical for ACS. Patient will follow up with cardiologist in reference to palpitations and question whether or not she needs increase in metoprolol.     Merrily Pew, MD 05/12/15 [phone]

## 2015-06-12 ENCOUNTER — Ambulatory Visit (INDEPENDENT_AMBULATORY_CARE_PROVIDER_SITE_OTHER): Payer: Medicare Other | Admitting: Cardiovascular Disease

## 2015-06-12 ENCOUNTER — Encounter: Payer: Self-pay | Admitting: Cardiovascular Disease

## 2015-06-12 VITALS — BP 106/60 | HR 60 | Ht 66.0 in | Wt 190.2 lb

## 2015-06-12 DIAGNOSIS — I10 Essential (primary) hypertension: Secondary | ICD-10-CM | POA: Diagnosis not present

## 2015-06-12 DIAGNOSIS — I480 Paroxysmal atrial fibrillation: Secondary | ICD-10-CM | POA: Diagnosis not present

## 2015-06-12 NOTE — Patient Instructions (Signed)
Medication Instructions:  Your physician recommends that you continue on your current medications as directed. Please refer to the Current Medication list given to you today.   Labwork: None Ordered   Testing/Procedures: None Ordered   Follow-Up: Your physician wants you to follow-up in: 1 year with Dr. Nahser.  You will receive a reminder letter in the mail two months in advance. If you don't receive a letter, please call our office to schedule the follow-up appointment.   If you need a refill on your cardiac medications before your next appointment, please call your pharmacy.   Thank you for choosing [facility]! Gerrett Loman, RN [phone]    

## 2015-06-12 NOTE — Progress Notes (Signed)
Janice [name] Date of Birth  11-Aug-1931 [place]  [id number]    [place]   [place], [place]  [address]    [place], [place]  [address] [phone]  Fax  [phone]  [phone]  Fax [phone]  List: 1. Atrial fibrillation 2. Diabetes mellitus  3. Hypertension 4. Hyperlipidemia 5. Chest pain-cardiac catheterization in April, 2011 revealed smooth and normal coronary arteries  History of Present Illness:  Janice [name] is a 82 yo with a history of intermittent atrial fibrillation. She has been in normal sinus rhythm for the past several years. She also has a history of diabetes mellitus, hypertension, and hyperlipidemia. She's had episodes of chest pain in the past but has normal coronary arteries by heart catheterization in 2011.  She has not been exercising as much as she would like.  She used to go to water aerobics but has not been in a while  June 12, 2015: Janice [name] is seen again after a 3 year absence.    Has known PAF Doing well.   No urgent issues   Current Outpatient Prescriptions on File Prior to Visit  Medication Sig Dispense Refill  . ALPRAZolam (XANAX) 0.25 MG tablet Take 1 tablet (0.25 mg total) by mouth daily as needed for anxiety. 30 tablet 0  . amLODipine (NORVASC) 2.5 MG tablet Take 2.5 mg by mouth daily.    Marland Kitchen aspirin 81 MG tablet Take 81 mg by mouth daily.      . calcitonin, salmon, (MIACALCIN/FORTICAL) 200 UNIT/ACT nasal spray Place 1 spray into alternate nostrils daily. (Patient taking differently: Place 1 spray into alternate nostrils daily as needed (allergies). ) 3.7 mL 0  . Cyanocobalamin (VITAMIN B-12) 500 MCG SUBL Place 1 tablet under the tongue daily.    Marland Kitchen geriatric multivitamins-minerals (ELDERTONIC/GEVRABON) ELIX Take 15 mLs by mouth 2 (two) times daily.    . irbesartan (AVAPRO) 300 MG tablet Take 300 mg by mouth daily.     . LUTEIN PO Take 1 tablet by mouth daily.    . metoprolol  (LOPRESSOR) 50 MG tablet Take 50 mg by mouth 2 (two) times daily.     . nitroGLYCERIN (NITROSTAT) 0.4 MG SL tablet Place 0.4 mg under the tongue every 5 (five) minutes as needed for chest pain.     Marland Kitchen oxyCODONE (ROXICODONE) 5 MG immediate release tablet Take 1 tablet (5 mg total) by mouth every 4 (four) hours as needed for severe pain. 30 tablet 0  . PARoxetine (PAXIL) 20 MG tablet Take 20 mg by mouth daily.    Marland Kitchen PARoxetine (PAXIL) 30 MG tablet Take 30 mg by mouth daily. Reported on 05/11/2015    . polyethylene glycol (MIRALAX / GLYCOLAX) packet Take 17 g by mouth daily.     Marland Kitchen senna (SENOKOT) 8.6 MG TABS tablet Take 1 tablet (8.6 mg total) by mouth 2 (two) times daily. 120 each 0  . simvastatin (ZOCOR) 40 MG tablet Take 20 mg by mouth daily at 6 PM.    . warfarin (COUMADIN) 2.5 MG tablet Take 1.25 mg by mouth daily.      No current facility-administered medications on file prior to visit.    Allergies  Allergen Reactions  . Demerol [Meperidine] Nausea Only    And vertigo  . Meperidine Hcl     REACTION: nausea and vomiting    Past Medical History  Diagnosis Date  . Atrial fibrillation ([place])   . Diabetes  mellitus   . Hypertension   . Hyperlipidemia   . Chest pain   . Allergic rhinitis   . Obesity   . Osteoarthritis   . Cataracts, bilateral   . Endometrial cancer ([place])   . GERD (gastroesophageal reflux disease)   . CKD (chronic kidney disease)   . Lymphedema     Past Surgical History  Procedure Laterality Date  . Cardiac catheterization  06/2009    REVEALED SMOOTH AND NORMAL CORONARY ARTERIES  . Back surgery    . Cholecystectomy    . Vaginal hysterectomy    . Appendectomy      History  Smoking status  . Never Smoker   Smokeless tobacco  . Never Used    History  Alcohol Use No    Family History  Problem Relation Age of Onset  . Colon cancer Mother   . Kidney cancer Father   . Lung cancer Sister   . Colon cancer Sister     x2    Reviw of Systems:  Reviewed  in the HPI.  All other systems are negative.  Physical Exam: Blood pressure 106/60, pulse 60, height 5\' 6"  (1.676 m), weight 190 lb 3.2 oz (86.274 kg).  General: Well developed, well nourished, in no acute distress.  Head: Normocephalic, atraumatic, sclera non-icteric, mucus membranes are moist   Neck: Supple. Negative for carotid bruits. JVD not elevated.  Lungs: Clear bilaterally to auscultation without wheezes, rales, or rhonchi. Breathing is unlabored.  Heart: RRR with S1 S2. No murmurs, rubs, or gallops appreciated.  Abdomen: Soft, non-tender, non-distended with normoactive bowel sounds. No hepatomegaly. No rebound/guarding. No obvious abdominal masses.  Msk:  Strength and tone appear normal for age.  Extremities: No clubbing or cyanosis. No edema.  Distal pedal pulses are 2+ and equal bilaterally.  Neuro: Alert and oriented X 3. Moves all extremities spontaneously.  Psych:  Responds to questions appropriately with a normal affect.   ECG:  Assessment / Plan:   1. Atrial fibrillation - has known PAF .  Doing well No changes in medical therapy.   Will see her in 1 year   2. Diabetes mellitus  3. Hypertension 4. Hyperlipidemia 5. Chest pain-cardiac catheterization in April, 2011 revealed smooth and normal coronary arteries    Conny Moening, Wonda Cheng, MD  06/12/2015 11:29 AM    [facility] [place],  [place], [place]  [address] Pager [phone] Phone: [phone]; Fax: [phone]

## 2015-07-03 ENCOUNTER — Other Ambulatory Visit: Payer: Self-pay | Admitting: Orthopedic Surgery

## 2015-07-04 ENCOUNTER — Other Ambulatory Visit: Payer: Self-pay | Admitting: Orthopedic Surgery

## 2015-07-04 DIAGNOSIS — M545 Low back pain: Secondary | ICD-10-CM

## 2015-07-11 ENCOUNTER — Ambulatory Visit
Admission: RE | Admit: 2015-07-11 | Discharge: 2015-07-11 | Disposition: A | Discharge status: Home / Self Care | Payer: Medicare Other | Source: Ambulatory Visit | Attending: Orthopedic Surgery | Admitting: Orthopedic Surgery

## 2015-07-11 DIAGNOSIS — M545 Low back pain: Secondary | ICD-10-CM

## 2015-07-17 ENCOUNTER — Telehealth: Payer: Self-pay | Admitting: Pharmacist

## 2015-07-17 NOTE — Telephone Encounter (Addendum)
Received clearance form from [facility] that pt is having a spinal injection. She is on Coumadin for afib and has a CHADS2 score of 3 (age, HTN, DM). Ok for pt to hold Coumadin x5 days prior to her procedure, does not need a Lovenox bridge. We do not manage pt's Coumadin, this is done by Dr. Harlan Stains at [facility]. Will fax this clearance to her because pt will need INR check scheduled either the day before or the morning of her spinal injection, fax # [phone]. Pt is aware to hold her Coumadin x5 days, no procedure date set yet. Also faxing clearance back to [facility] to Dr. Ernestina Patches fax [phone].

## 2015-08-14 ENCOUNTER — Telehealth: Payer: Self-pay | Admitting: Nurse Practitioner

## 2015-08-14 NOTE — Telephone Encounter (Signed)
I received paper clearance from Dr. Ernestina Patches, [place] regarding discontinuation of Coumadin for upcoming ESI.  I called and spoke with Jinny Blossom,  Dr. Romona Curls scheduler, and advised her of epic note from [place], [facility] on 5/4.  She advised that she will follow-up with patient to make certain Dr. Orest Dikes office checks Pt/INR day before or day of the injection.  She states I do not need to return paper clearance, that she has documented verbal clearance from Dr. Acie Fredrickson.

## 2015-12-18 ENCOUNTER — Encounter: Payer: Self-pay | Admitting: Physical Therapy

## 2015-12-18 ENCOUNTER — Ambulatory Visit: Payer: Medicare Other | Attending: Specialist | Admitting: Physical Therapy

## 2015-12-18 DIAGNOSIS — M545 Low back pain: Secondary | ICD-10-CM | POA: Diagnosis present

## 2015-12-18 DIAGNOSIS — R2689 Other abnormalities of gait and mobility: Secondary | ICD-10-CM | POA: Diagnosis present

## 2015-12-18 DIAGNOSIS — M6281 Muscle weakness (generalized): Secondary | ICD-10-CM

## 2015-12-18 DIAGNOSIS — G8929 Other chronic pain: Secondary | ICD-10-CM | POA: Diagnosis present

## 2015-12-18 DIAGNOSIS — M542 Cervicalgia: Secondary | ICD-10-CM

## 2015-12-18 NOTE — Therapy (Signed)
[facility] [address] [place], [place], [address] Phone: [phone]   Fax:  [phone]  Physical Therapy Evaluation  Patient Details  Name: Janice [name] MRN: [id number] Date of Birth: 1931/11/21 Referring Provider: Dr. Basil Dess  Encounter Date: 12/18/2015      PT End of Session - 12/18/15 1135    Visit Number 1   Number of Visits 10   Date for PT Re-Evaluation 02/12/16   Authorization Type g-code 10th visit   PT Start Time 1106   PT Stop Time 1142   PT Time Calculation (min) 36 min   Activity Tolerance Patient tolerated treatment well   Behavior During Therapy [facility] for tasks assessed/performed      Past Medical History:  Diagnosis Date  . Allergic rhinitis   . Atrial fibrillation ([place])   . Cataracts, bilateral   . Chest pain   . CKD (chronic kidney disease)   . Diabetes mellitus   . Endometrial cancer ([place])   . GERD (gastroesophageal reflux disease)   . Hyperlipidemia   . Hypertension   . Lymphedema   . Obesity   . Osteoarthritis     Past Surgical History:  Procedure Laterality Date  . APPENDECTOMY    . BACK SURGERY    . CARDIAC CATHETERIZATION  06/2009   REVEALED SMOOTH AND NORMAL CORONARY ARTERIES  . CHOLECYSTECTOMY    . VAGINAL HYSTERECTOMY      There were no vitals filed for this visit.       Subjective Assessment - 12/18/15 1112    Subjective Patient reports chronic neck and back pain.  Patient reports arthrits in cervical. Lumbar sugery 2-3 years ago. Patient reports her pain came on suddenly.    How long can you sit comfortably? decreased pain   How long can you stand comfortably? all the time   How long can you walk comfortably? painful   Patient Stated Goals decreased pain   Currently in Pain? Yes   Pain Score 3    Pain Location Neck   Pain Orientation Mid   Pain Descriptors / Indicators Dull   Pain Type Chronic pain   Pain Radiating Towards None   Pain Onset  More than a month ago   Pain Frequency Intermittent   Aggravating Factors  night time;    Pain Relieving Factors movement and exercise the neck   Multiple Pain Sites Yes   Pain Score 5   Pain Location Back   Pain Orientation Lower   Pain Descriptors / Indicators Dull;Sharp   Pain Type Chronic pain   Pain Radiating Towards goes into left leg   Pain Onset More than a month ago   Pain Frequency Intermittent   Aggravating Factors  turn over in bed, standing, walking   Pain Relieving Factors sitting            [facility] PT Assessment - 12/18/15 0001      Assessment   Medical Diagnosis Cervical stenosis; Dorsal Spondylosis; left parascapular pain   Referring Provider Dr. Basil Dess   Onset Date/Surgical Date 06/18/15   Prior Therapy None     Precautions   Precautions Other (comment)   Precaution Comments endometrial cancer no radiation or chemotherapy     Restrictions   Weight Bearing Restrictions No     Balance Screen   Has the patient fallen in the past 6 months No   Has the patient had a decrease in activity level because of a fear  of falling?  Yes   Is the patient reluctant to leave their home because of a fear of falling?  No     Home Ecologist residence     Prior Function   Level of Independence Independent   Vocation Retired     Associate Professor   Overall Cognitive Status Within Functional Limits for tasks assessed     Observation/Other Assessments   Focus on Therapeutic Outcomes (FOTO)  47% limitaiton  goal is 39% limitaiton     Posture/Postural Control   Posture/Postural Control Postural limitations   Postural Limitations Rounded Shoulders;Forward head;Decreased lumbar lordosis     ROM / Strength   AROM / PROM / Strength AROM;Strength     AROM   AROM Assessment Site Cervical;Lumbar   Cervical Flexion full   Cervical Extension decreased by 75%   Cervical - Right Side Bend decreased by 50%   Cervical - Left Side Bend decreased  by 50%   Cervical - Right Rotation decreased by 50%   Cervical - Left Rotation decreased by 50%   Lumbar Flexion decreased by 50%   Lumbar Extension decreased by 75%   Lumbar - Right Side Bend decreased by 50%   Lumbar - Left Side Bend decreased by 50%     Strength   Overall Strength Comments bil. shoulder fleixon and abduction 4/5   Right/Left Hip Right;Left   Right Hip Flexion 3/5   Right Hip Extension 4+/5   Right Hip External Rotation  5/5   Right Hip Internal Rotation 5/5   Right Hip ABduction 4+/5   Left Hip Flexion 3/5   Left Hip Extension 3/5   Left Hip External Rotation 5/5   Left Hip Internal Rotation 5/5   Left Hip ABduction 3/5   Right Knee Extension 4/5   Left Knee Extension 4/5   Right Ankle Plantar Flexion 4/5   Left Ankle Plantar Flexion 3/5     Ambulation/Gait   Ambulation/Gait Yes   Ambulation/Gait Assistance 6: Modified independent (Device/Increase time)  quad cane   Assistive device Small based quad cane   Gait Pattern Decreased stride length;Trunk flexed;Narrow base of support   Stairs Yes   Stair Management Technique Two rails;Step to pattern   Curb 6: Modified independent (Device/increase time)  unable to do a high curb     Standardized Balance Assessment   Five times sit to stand comments  30 sec     Timed Up and Go Test   TUG Normal TUG   Normal TUG (seconds) 18  no assistive device   TUG Comments high risk of falls                           PT Education - 12/18/15 1309    Education provided No          PT Short Term Goals - 12/18/15 1309      PT SHORT TERM GOAL #1   Title understand tips to avoid goals   Time 4   Period Weeks   Status New     PT SHORT TERM GOAL #2   Title independent with flexibility exericses   Time 4   Period Weeks   Status New     PT SHORT TERM GOAL #3   Title independent with cervical and lumbar ROM exercises   Time 4   Period Weeks   Status New     PT SHORT TERM GOAL #4  Title sit to stand </= 25 sec   Time 4   Period Weeks   Status New     PT SHORT TERM GOAL #5   Title TUG score </= 15 sec   Time 4   Period Weeks   Status New           PT Long Term Goals - 12/18/15 1135      PT LONG TERM GOAL #1   Title independent with HEP   Time 8   Period Weeks   Status New     PT LONG TERM GOAL #2   Title sit to stand </= 18 sec with pain decreased >/= 50%   Time 8   Period Weeks   Status New     PT LONG TERM GOAL #3   Title TUG score </= 13 sec due to increased balance and increased LE strength   Time 8   Period Weeks   Status New     PT LONG TERM GOAL #4   Title walk with pain </= 50% due to increased strength and mobility   Time 8   Period Weeks   Status New     PT LONG TERM GOAL #5   Title sleep with pain decreased >/= 50% due to improve cervical ROM   Time 8   Period Weeks   Status New               Plan - 12/18/15 1140    Clinical Impression Statement Patient is a 82 year old female with cervical and lumbar pain that is chronic and worse in the past 6 months.  Patient reports her intermittent cervical pain is 3/10 and lumbar is 5/10.  Cervical pain worse with sleeping.  Lumbar pain worse with walking and standing.  Patient walks with a small base quad cane due to fear of falling.  TUG score is 18 sec. and sit to stand is 30 seconds indicating high risk of falls.  Patient will not do stairs due to difficulty and using a step to step pattern on small steps only.  Cervical and lumbar ROM is significantly limited. Bil. shoulder flexion and abduction strength is 3/5. Bilateral knee extension is 4/5.  Decreased strength for bil. hips and ankle plantarflexion with left worse than right. Patient is moderately complex evaluation due to an evolving condition and comorbidities such as history of pelvic flracture, history of endometrial cancer without radiation, and history of lumbar surgery that will impact care provided.  Patient will benfit  form skilled therapy to reduce pain and improve balance.    Rehab Potential Good   Clinical Impairments Affecting Rehab Potential history of lumbar surgery, pelvic fracture and endometrial cancer   PT Frequency 2x / week   PT Duration 8 weeks   PT Treatment/Interventions Electrical Stimulation;Cryotherapy;Gait training;Stair training;Ultrasound;Therapeutic activities;Traction;Therapeutic exercise;Balance training;Neuromuscular re-education;Patient/family education;Passive range of motion;Manual techniques;Dry needling;Energy conservation   PT Next Visit Plan balance exercises; cervical ROM; flexibility exercises, modalities as needed; nustep   PT Home Exercise Plan progress as needed   Recommended Other Services none   Consulted and Agree with Plan of Care Patient      Patient will benefit from skilled therapeutic intervention in order to improve the following deficits and impairments:  Pain, Decreased mobility, Decreased strength, Impaired flexibility, Decreased balance, Decreased activity tolerance, Decreased endurance, Increased muscle spasms, Decreased range of motion, Difficulty walking, Increased fascial restricitons  Visit Diagnosis: Muscle weakness (generalized) - Plan: PT plan of care cert/re-cert  Chronic left-sided low back pain, with sciatica presence unspecified - Plan: PT plan of care cert/re-cert  Cervicalgia - Plan: PT plan of care cert/re-cert  Other abnormalities of gait and mobility - Plan: PT plan of care cert/re-cert      G-Codes - [id number]    Functional Assessment Tool Used FOTO score is 47% limitation   Functional Limitation Changing and maintaining body position   Changing and Maintaining Body Position Current Status [id number]) At least 40 percent but less than 60 percent impaired, limited or restricted   Changing and Maintaining Body Position Goal Status [id number]) At least 20 percent but less than 40 percent impaired, limited or restricted       Problem  List Patient Active Problem List   Diagnosis Date Noted  . Long term current use of anticoagulant therapy 09/27/2014  . CKD (chronic kidney disease) stage 3, GFR 30-59 ml/min 09/20/2014  . Diet-controlled diabetes mellitus ([place]) 09/20/2014  . Pelvic fracture ([place]) 09/17/2014  . Sacral fracture, closed ([place]) 09/17/2014  . Warfarin anticoagulation 09/17/2014  . Warfarin-induced coagulopathy ([place]) 06/23/2013  . Renal insufficiency 06/23/2013  . Chest pain 06/22/2013  . Other dysphagia 07/24/2012  . Atrial fibrillation ([place])   . Diabetes mellitus without complication ([place])   . Hypertension   . Hyperlipidemia   . Chest pain     Earlie Counts, PT 12/18/15 1:14 PM   [facility] [address] [place], [place], [address] Phone: [phone]   Fax:  [phone]  Name: Janice [name] MRN: [id number] Date of Birth: Aug 16, 1931

## 2015-12-22 ENCOUNTER — Ambulatory Visit: Payer: Medicare Other

## 2015-12-22 DIAGNOSIS — M6281 Muscle weakness (generalized): Secondary | ICD-10-CM

## 2015-12-22 DIAGNOSIS — M545 Low back pain: Secondary | ICD-10-CM

## 2015-12-22 DIAGNOSIS — R2689 Other abnormalities of gait and mobility: Secondary | ICD-10-CM

## 2015-12-22 DIAGNOSIS — G8929 Other chronic pain: Secondary | ICD-10-CM

## 2015-12-22 DIAGNOSIS — M542 Cervicalgia: Secondary | ICD-10-CM

## 2015-12-22 NOTE — Patient Instructions (Addendum)
PERFORM ALL EXERCISES GENTLY AND WITH GOOD POSTURE.    20 SECOND HOLD, 3 REPS TO EACH SIDE. 4-5 TIMES EACH DAY.   AROM: Neck Rotation   Turn head slowly to look over one shoulder, then the other.   AROM: Neck Flexion   Bend head forward.   AROM: Lateral Neck Flexion   Slowly tilt head toward one shoulder, then the other.   Perform all exercises below:  Hold _20___ seconds. Repeat _3___ times.  Do __3__ sessions per day. CAUTION: Movement should be gentle, steady and slow.  HIP: Hamstrings - Short Sitting   Rest leg on raised surface. Keep knee straight. Lift chest.  KNEE: Extension, Long Arc Quad (Weight)  Place weight around leg. Raise leg until knee is straight. Hold _5__ seconds. Use ___ lb weight. _10__ reps per set (each leg), 4-5__ sets per day, __7_ days per week   Knee Raise   Lift knee and then lower it. Repeat with other knee. Repeat _10__ times each leg. Do _4-5___ sessions per day.  http://gt2.exer.us/445   Copyright  VHI. All rights reserved.  Toe Up   Gently rise up on toes and back on heels. Repeat _20___ times. Do 4-5____ sessions per day.  Janice [name] [address] [place], [place] [address] Phone # [phone] Fax [phone]

## 2015-12-22 NOTE — Therapy (Signed)
[facility] [address] [place], [place], [address] Phone: [phone]   Fax:  [phone]  Physical Therapy Treatment  Patient Details  Name: Janice [name] MRN: [id number] Date of Birth: [birth year] Referring Provider: Dr. Basil Dess  Encounter Date: 12/22/2015      PT End of Session - 12/22/15 1604    Visit Number 2   Number of Visits 10   Date for PT Re-Evaluation 02/12/16   Authorization Type g-code 10th visit   PT Start Time 1529   PT Stop Time 1611   PT Time Calculation (min) 42 min   Activity Tolerance Patient tolerated treatment well   Behavior During Therapy [facility] for tasks assessed/performed      Past Medical History:  Diagnosis Date  . Allergic rhinitis   . Atrial fibrillation ([place])   . Cataracts, bilateral   . Chest pain   . CKD (chronic kidney disease)   . Diabetes mellitus   . Endometrial cancer ([place])   . GERD (gastroesophageal reflux disease)   . Hyperlipidemia   . Hypertension   . Lymphedema   . Obesity   . Osteoarthritis     Past Surgical History:  Procedure Laterality Date  . APPENDECTOMY    . BACK SURGERY    . CARDIAC CATHETERIZATION  06/2009   REVEALED SMOOTH AND NORMAL CORONARY ARTERIES  . CHOLECYSTECTOMY    . VAGINAL HYSTERECTOMY      There were no vitals filed for this visit.      Subjective Assessment - 12/22/15 1532    Subjective No pain today.     Currently in Pain? No/denies                         [facility] Adult PT Treatment/Exercise - 12/22/15 0001      Exercises   Exercises Knee/Hip;Lumbar;Neck     Neck Exercises: Seated   Other Seated Exercise seated cervical AROM 3 ways 3x20 seconds each     Knee/Hip Exercises: Stretches   Active Hamstring Stretch Both;3 reps;20 seconds     Knee/Hip Exercises: Aerobic   Nustep Level 1 x 6 minutes     Knee/Hip Exercises: Standing   Heel Raises 2 sets;10 reps   Hip Abduction Stengthening;2 sets;10  reps  abdominal bracing   Hip Extension Stengthening;Both;2 sets;10 reps  abdominal bracing   Rocker Board 3 minutes   Rebounder weight shifting 3 ways x 1 minute each  difficulty getting on to mini tramp     Knee/Hip Exercises: Seated   Long Arc Quad Both;2 sets;10 reps   Marching Strengthening;Both;2 sets;10 reps                PT Education - 12/22/15 1539    Education provided Yes   Education Details HEP: cervical AROM, seated LE strength, hamstring stretch   Person(s) Educated Patient   Methods Explanation;Demonstration;Handout   Comprehension Verbalized understanding;Returned demonstration          PT Short Term Goals - 12/22/15 1529      PT SHORT TERM GOAL #1   Title understand tips to avoid falls   Time 4   Period Weeks   Status On-going     PT SHORT TERM GOAL #2   Title independent with flexibility exericses   Time 4   Period Weeks   Status On-going     PT SHORT TERM GOAL #3   Title independent with cervical and lumbar ROM exercises  Time 4   Period Weeks   Status On-going           PT Long Term Goals - 12/18/15 1135      PT LONG TERM GOAL #1   Title independent with HEP   Time 8   Period Weeks   Status New     PT LONG TERM GOAL #2   Title sit to stand </= 18 sec with pain decreased >/= 50%   Time 8   Period Weeks   Status New     PT LONG TERM GOAL #3   Title TUG score </= 13 sec due to increased balance and increased LE strength   Time 8   Period Weeks   Status New     PT LONG TERM GOAL #4   Title walk with pain </= 50% due to increased strength and mobility   Time 8   Period Weeks   Status New     PT LONG TERM GOAL #5   Title sleep with pain decreased >/= 50% due to improve cervical ROM   Time 8   Period Weeks   Status New               Plan - 12/22/15 1533    Clinical Impression Statement Pt with only 1 session after evaluation.  PT issued HEP for cervical and lumbar flexibility today and LE strength  exercises in sitting.  No pain reported today.  Pt with LE weakness cervical/lumbar AROM deficitis.  Pt will continue to benefit from skilled PT for LE strength, cervical and lumbar flexibility and balance exercises to reduce pain and improve safety.     Rehab Potential Good   Clinical Impairments Affecting Rehab Potential history of lumbar surgery, pelvic fracture and endometrial cancer   PT Frequency 2x / week   PT Duration 8 weeks   PT Treatment/Interventions Electrical Stimulation;Cryotherapy;Gait training;Stair training;Ultrasound;Therapeutic activities;Traction;Therapeutic exercise;Balance training;Neuromuscular re-education;Patient/family education;Passive range of motion;Manual techniques;Dry needling;Energy conservation   PT Next Visit Plan balance exercises; cervical ROM; flexibility exercises, modalities as needed; nustep   Consulted and Agree with Plan of Care Patient      Patient will benefit from skilled therapeutic intervention in order to improve the following deficits and impairments:  Pain, Decreased mobility, Decreased strength, Impaired flexibility, Decreased balance, Decreased activity tolerance, Decreased endurance, Increased muscle spasms, Decreased range of motion, Difficulty walking, Increased fascial restricitons  Visit Diagnosis: Muscle weakness (generalized)  Chronic left-sided low back pain, with sciatica presence unspecified  Cervicalgia  Other abnormalities of gait and mobility     Problem List Patient Active Problem List   Diagnosis Date Noted  . Long term current use of anticoagulant therapy 09/27/2014  . CKD (chronic kidney disease) stage 3, GFR 30-59 ml/min 09/20/2014  . Diet-controlled diabetes mellitus ([place]) 09/20/2014  . Pelvic fracture ([place]) 09/17/2014  . Sacral fracture, closed ([place]) 09/17/2014  . Warfarin anticoagulation 09/17/2014  . Warfarin-induced coagulopathy ([place]) 06/23/2013  . Renal insufficiency 06/23/2013  . Chest pain 06/22/2013   . Other dysphagia 07/24/2012  . Atrial fibrillation ([place])   . Diabetes mellitus without complication ([place])   . Hypertension   . Hyperlipidemia   . Chest pain      Sigurd Sos, PT 12/22/15 4:07 PM  [facility] [address], [place], [place], [address] Phone: [phone]   Fax:  [phone]  Name: Janice [name] MRN: [id number] Date of Birth: 02-Oct-1931

## 2015-12-24 ENCOUNTER — Ambulatory Visit: Payer: Medicare Other

## 2015-12-24 DIAGNOSIS — R2689 Other abnormalities of gait and mobility: Secondary | ICD-10-CM

## 2015-12-24 DIAGNOSIS — G8929 Other chronic pain: Secondary | ICD-10-CM

## 2015-12-24 DIAGNOSIS — M545 Low back pain: Secondary | ICD-10-CM

## 2015-12-24 DIAGNOSIS — M542 Cervicalgia: Secondary | ICD-10-CM

## 2015-12-24 DIAGNOSIS — M6281 Muscle weakness (generalized): Secondary | ICD-10-CM | POA: Diagnosis not present

## 2015-12-24 NOTE — Therapy (Signed)
[facility] [address] [place], [place], [address] Phone: [phone]   Fax:  [phone]  Physical Therapy Treatment  Patient Details  Name: Janice [name] MRN: [id number] Date of Birth: 09-Nov-1931 Referring Provider: Dr. Basil Dess  Encounter Date: 12/24/2015      PT End of Session - 12/24/15 1308    Visit Number 3   Number of Visits 10   Date for PT Re-Evaluation 02/12/16   Authorization Type g-code 10th visit   PT Start Time 1230   PT Stop Time 1312   PT Time Calculation (min) 42 min   Activity Tolerance Patient tolerated treatment well   Behavior During Therapy [facility] for tasks assessed/performed      Past Medical History:  Diagnosis Date  . Allergic rhinitis   . Atrial fibrillation ([place])   . Cataracts, bilateral   . Chest pain   . CKD (chronic kidney disease)   . Diabetes mellitus   . Endometrial cancer ([place])   . GERD (gastroesophageal reflux disease)   . Hyperlipidemia   . Hypertension   . Lymphedema   . Obesity   . Osteoarthritis     Past Surgical History:  Procedure Laterality Date  . APPENDECTOMY    . BACK SURGERY    . CARDIAC CATHETERIZATION  06/2009   REVEALED SMOOTH AND NORMAL CORONARY ARTERIES  . CHOLECYSTECTOMY    . VAGINAL HYSTERECTOMY      There were no vitals filed for this visit.                       [place] Adult PT Treatment/Exercise - 12/24/15 0001      Neck Exercises: Seated   Other Seated Exercise seated cervical AROM 3 ways 3x20 seconds each     Knee/Hip Exercises: Stretches   Active Hamstring Stretch Both;3 reps;20 seconds     Knee/Hip Exercises: Aerobic   Nustep Level 1 x 8 minutes  PT present to discuss exercises with pt     Knee/Hip Exercises: Standing   Heel Raises 2 sets;10 reps   Hip Abduction Stengthening;2 sets;10 reps  abdominal bracing   Hip Extension Stengthening;Both;2 sets;10 reps  abdominal bracing   Rocker Board 3  minutes   Rebounder weight shifting 3 ways x 1 minute each  difficulty getting on to mini tramp     Knee/Hip Exercises: Seated   Long Arc Quad Both;2 sets;10 reps   [facility] 20   Marching Strengthening;Both;2 sets;10 reps                  PT Short Term Goals - 12/22/15 1529      PT SHORT TERM GOAL #1   Title understand tips to avoid falls   Time 4   Period Weeks   Status On-going     PT SHORT TERM GOAL #2   Title independent with flexibility exericses   Time 4   Period Weeks   Status On-going     PT SHORT TERM GOAL #3   Title independent with cervical and lumbar ROM exercises   Time 4   Period Weeks   Status On-going           PT Long Term Goals - 12/18/15 1135      PT LONG TERM GOAL #1   Title independent with HEP   Time 8   Period Weeks   Status New     PT LONG TERM GOAL #2  Title sit to stand </= 18 sec with pain decreased >/= 50%   Time 8   Period Weeks   Status New     PT LONG TERM GOAL #3   Title TUG score </= 13 sec due to increased balance and increased LE strength   Time 8   Period Weeks   Status New     PT LONG TERM GOAL #4   Title walk with pain </= 50% due to increased strength and mobility   Time 8   Period Weeks   Status New     PT LONG TERM GOAL #5   Title sleep with pain decreased >/= 50% due to improve cervical ROM   Time 8   Period Weeks   Status New               Plan - 12/24/15 1232    Clinical Impression Statement Pt is independent and compliant with HEP for cervical and lumbar flexiblity.  Pt with LE weakness an cervical/lumbar A/ROM deficits of a chronic nature.  Pt require minor verbal and demo cues for techique with exercise today.  Pt will continue to benefit from skilled PT for LE strength, flexiblity and balance training to imrpove safety.   Rehab Potential Good   Clinical Impairments Affecting Rehab Potential history of lumbar surgery, pelvic fracture and endometrial cancer   PT Frequency 2x /  week   PT Duration 8 weeks   PT Treatment/Interventions Electrical Stimulation;Cryotherapy;Gait training;Stair training;Ultrasound;Therapeutic activities;Traction;Therapeutic exercise;Balance training;Neuromuscular re-education;Patient/family education;Passive range of motion;Manual techniques;Dry needling;Energy conservation   PT Next Visit Plan balance exercises; cervical ROM; flexibility exercises, modalities as needed; nustep   Consulted and Agree with Plan of Care Patient      Patient will benefit from skilled therapeutic intervention in order to improve the following deficits and impairments:  Pain, Decreased mobility, Decreased strength, Impaired flexibility, Decreased balance, Decreased activity tolerance, Decreased endurance, Increased muscle spasms, Decreased range of motion, Difficulty walking, Increased fascial restricitons  Visit Diagnosis: Muscle weakness (generalized)  Chronic left-sided low back pain, with sciatica presence unspecified  Cervicalgia  Other abnormalities of gait and mobility     Problem List Patient Active Problem List   Diagnosis Date Noted  . Long term current use of anticoagulant therapy 09/27/2014  . CKD (chronic kidney disease) stage 3, GFR 30-59 ml/min 09/20/2014  . Diet-controlled diabetes mellitus ([place]) 09/20/2014  . Pelvic fracture ([place]) 09/17/2014  . Sacral fracture, closed ([place]) 09/17/2014  . Warfarin anticoagulation 09/17/2014  . Warfarin-induced coagulopathy ([place]) 06/23/2013  . Renal insufficiency 06/23/2013  . Chest pain 06/22/2013  . Other dysphagia 07/24/2012  . Atrial fibrillation ([place])   . Diabetes mellitus without complication ([place])   . Hypertension   . Hyperlipidemia   . Chest pain      Sigurd Sos, PT 12/24/15 1:09 PM  [facility] [address] [place], [place], [address] Phone: [phone]   Fax:  [phone]  Name: ELICIA GRAPES MRN:  [id number] Date of Birth: 1931/11/23

## 2015-12-29 ENCOUNTER — Encounter: Payer: Self-pay | Admitting: Physical Therapy

## 2015-12-29 ENCOUNTER — Ambulatory Visit: Payer: Medicare Other | Admitting: Physical Therapy

## 2015-12-29 DIAGNOSIS — M6281 Muscle weakness (generalized): Secondary | ICD-10-CM | POA: Diagnosis not present

## 2015-12-29 DIAGNOSIS — G8929 Other chronic pain: Secondary | ICD-10-CM

## 2015-12-29 DIAGNOSIS — M542 Cervicalgia: Secondary | ICD-10-CM

## 2015-12-29 DIAGNOSIS — R2689 Other abnormalities of gait and mobility: Secondary | ICD-10-CM

## 2015-12-29 DIAGNOSIS — M545 Low back pain: Secondary | ICD-10-CM

## 2015-12-29 NOTE — Therapy (Signed)
[facility] [address] [place], [place], [address] Phone: [phone]   Fax:  [phone]  Physical Therapy Treatment  Patient Details  Name: Janice [name] MRN: [id number] Date of Birth: [birth year] Referring Provider: Dr. Basil Dess  Encounter Date: 12/29/2015      PT End of Session - 12/29/15 1539    Visit Number 4   Number of Visits 10   Date for PT Re-Evaluation 02/12/16   Authorization Type g-code 10th visit   PT Start Time [id number]   PT Stop Time 1612   PT Time Calculation (min) 41 min   Activity Tolerance Patient tolerated treatment well   Behavior During Therapy [facility] for tasks assessed/performed      Past Medical History:  Diagnosis Date  . Allergic rhinitis   . Atrial fibrillation ([place])   . Cataracts, bilateral   . Chest pain   . CKD (chronic kidney disease)   . Diabetes mellitus   . Endometrial cancer ([place])   . GERD (gastroesophageal reflux disease)   . Hyperlipidemia   . Hypertension   . Lymphedema   . Obesity   . Osteoarthritis     Past Surgical History:  Procedure Laterality Date  . APPENDECTOMY    . BACK SURGERY    . CARDIAC CATHETERIZATION  06/2009   REVEALED SMOOTH AND NORMAL CORONARY ARTERIES  . CHOLECYSTECTOMY    . VAGINAL HYSTERECTOMY      There were no vitals filed for this visit.      Subjective Assessment - 12/29/15 1532    Subjective Pt reports soreness all over today. Pt relates pain to arthritis and cold weather.    How long can you sit comfortably? decreased pain   How long can you stand comfortably? all the time   How long can you walk comfortably? painful   Patient Stated Goals decreased pain   Currently in Pain? Yes   Pain Score 3    Pain Location Neck   Pain Orientation Mid   Pain Descriptors / Indicators Dull   Pain Type Chronic pain   Pain Onset More than a month ago   Multiple Pain Sites Yes   Pain Score 3   Pain Location Back   Pain Orientation  Lower   Pain Descriptors / Indicators Dull;Sharp   Pain Type Chronic pain   Pain Onset More than a month ago                         [facility] Adult PT Treatment/Exercise - 12/29/15 0001      Neck Exercises: Theraband   Rows 20 reps  yellow tband   Horizontal ABduction 20 reps  yellow tband     Neck Exercises: Seated   Other Seated Exercise seated cervical AROM 3 ways 3x20 seconds each   Other Seated Exercise --  yellow tband     Knee/Hip Exercises: Stretches   Active Hamstring Stretch Both;3 reps;20 seconds     Knee/Hip Exercises: Aerobic   Nustep Level 1 x 8 minutes  PT present to discuss exercises with pt     Knee/Hip Exercises: Standing   Heel Raises 2 sets;10 reps   Hip Abduction Stengthening;2 sets;10 reps  abdominal bracing   Hip Extension Stengthening;Both;2 sets;10 reps  abdominal bracing   Rebounder weight shifting 3 ways x 1 minute each  difficulty getting on to mini tramp     Knee/Hip Exercises: Seated   Long Arc [employer]  sets;10 reps   [employer] 20   Marching Strengthening;Both;2 sets;10 reps   Sit to [employer] 2 sets;5 reps  Stand by assist from therapist                PT Education - 12/29/15 1601    Education provided Yes   Education Details Fall prevention   Person(s) Educated Patient   Methods Explanation;Handout   Comprehension Verbalized understanding          PT Short Term Goals - 12/29/15 1552      PT SHORT TERM GOAL #1   Title understand tips to avoid falls   Time 4   Period Weeks   Status On-going     PT SHORT TERM GOAL #2   Title independent with flexibility exericses   Time 4   Period Weeks   Status On-going     PT SHORT TERM GOAL #3   Title independent with cervical and lumbar ROM exercises   Time 4   Period Weeks   Status On-going     PT SHORT TERM GOAL #4   Title sit to stand </= 25 sec   Time 4   Period Weeks   Status On-going     PT SHORT TERM GOAL #5   Title TUG score </= 15 sec    Time 4   Period Weeks   Status On-going           PT Long Term Goals - 12/29/15 1553      PT LONG TERM GOAL #1   Title independent with HEP   Time 8   Period Weeks   Status On-going     PT LONG TERM GOAL #2   Title sit to stand </= 18 sec with pain decreased >/= 50%   Time 8   Period Weeks   Status On-going     PT LONG TERM GOAL #3   Title TUG score </= 13 sec due to increased balance and increased LE strength   Time 8   Period Weeks   Status On-going     PT LONG TERM GOAL #4   Title walk with pain </= 50% due to increased strength and mobility   Time 8   Period Weeks   Status On-going     PT LONG TERM GOAL #5   Title sleep with pain decreased >/= 50% due to improve cervical ROM   Time 8   Period Weeks   Status On-going               Plan - 12/29/15 1711    Clinical Impression Statement Pt having increased pain today which patient related to weather. Pt able to complete all exercises well with no increase in pain. Pt and therapist discussed fall prevention at home. Pt would continue to benefit from skilled therapy for strengthening and stabilization to increase safety and decrease pain.     Rehab Potential Good   Clinical Impairments Affecting Rehab Potential history of lumbar surgery, pelvic fracture and endometrial cancer   PT Frequency 2x / week   PT Duration 8 weeks   PT Treatment/Interventions Electrical Stimulation;Cryotherapy;Gait training;Stair training;Ultrasound;Therapeutic activities;Traction;Therapeutic exercise;Balance training;Neuromuscular re-education;Patient/family education;Passive range of motion;Manual techniques;Dry needling;Energy conservation   PT Next Visit Plan balance exercises; cervical ROM; flexibility exercises, modalities as needed; nustep   PT Home Exercise Plan progress as needed   Consulted and Agree with Plan of Care Patient      Patient will benefit from skilled therapeutic intervention in  order to improve the following  deficits and impairments:  Pain, Decreased mobility, Decreased strength, Impaired flexibility, Decreased balance, Decreased activity tolerance, Decreased endurance, Increased muscle spasms, Decreased range of motion, Difficulty walking, Increased fascial restricitons  Visit Diagnosis: Muscle weakness (generalized)  Chronic left-sided low back pain, with sciatica presence unspecified  Cervicalgia  Other abnormalities of gait and mobility     Problem List Patient Active Problem List   Diagnosis Date Noted  . Long term current use of anticoagulant therapy 09/27/2014  . CKD (chronic kidney disease) stage 3, GFR 30-59 ml/min 09/20/2014  . Diet-controlled diabetes mellitus ([place]) 09/20/2014  . Pelvic fracture ([place]) 09/17/2014  . Sacral fracture, closed ([place]) 09/17/2014  . Warfarin anticoagulation 09/17/2014  . Warfarin-induced coagulopathy ([place]) 06/23/2013  . Renal insufficiency 06/23/2013  . Chest pain 06/22/2013  . Other dysphagia 07/24/2012  . Atrial fibrillation ([place])   . Diabetes mellitus without complication ([place])   . Hypertension   . Hyperlipidemia   . Chest pain     Mikle Bosworth PTA 12/29/2015, 5:16 PM  [facility] [address], [place], [place], [address] Phone: [phone]   Fax:  [phone]  Name: Janice [name] MRN: [id number] Date of Birth: Oct 11, 1931

## 2015-12-29 NOTE — Patient Instructions (Signed)

## 2015-12-31 ENCOUNTER — Ambulatory Visit: Payer: Medicare Other

## 2015-12-31 DIAGNOSIS — R2689 Other abnormalities of gait and mobility: Secondary | ICD-10-CM

## 2015-12-31 DIAGNOSIS — M6281 Muscle weakness (generalized): Secondary | ICD-10-CM

## 2015-12-31 DIAGNOSIS — G8929 Other chronic pain: Secondary | ICD-10-CM

## 2015-12-31 DIAGNOSIS — M545 Low back pain: Secondary | ICD-10-CM

## 2015-12-31 NOTE — Therapy (Signed)
[facility] [address], [place], [place], [address] Phone: [phone]   Fax:  [phone]  Physical Therapy Treatment  Patient Details  Name: Janice [name] MRN: [id number] Date of Birth: 01-30-1932 Referring Provider: Dr. Basil Dess  Encounter Date: 12/31/2015      PT End of Session - 12/31/15 1523    Visit Number 5   Number of Visits 10   Date for PT Re-Evaluation 02/12/16   Authorization Type g-code 10th visit   PT Start Time [id number]   PT Stop Time 1526   PT Time Calculation (min) 44 min   Activity Tolerance Patient tolerated treatment well   Behavior During Therapy [facility] for tasks assessed/performed      Past Medical History:  Diagnosis Date  . Allergic rhinitis   . Atrial fibrillation ([place])   . Cataracts, bilateral   . Chest pain   . CKD (chronic kidney disease)   . Diabetes mellitus   . Endometrial cancer ([place])   . GERD (gastroesophageal reflux disease)   . Hyperlipidemia   . Hypertension   . Lymphedema   . Obesity   . Osteoarthritis     Past Surgical History:  Procedure Laterality Date  . APPENDECTOMY    . BACK SURGERY    . CARDIAC CATHETERIZATION  06/2009   REVEALED SMOOTH AND NORMAL CORONARY ARTERIES  . CHOLECYSTECTOMY    . VAGINAL HYSTERECTOMY      There were no vitals filed for this visit.      Subjective Assessment - 12/31/15 1453    Subjective "My back was really hurting today and I had to take a pain pill"  Feeling better now.   Currently in Pain? Yes   Pain Score 2    Pain Location Back   Pain Orientation Right;Left;Lower   Pain Descriptors / Indicators Dull   Pain Type Chronic pain   Pain Onset More than a month ago   Pain Frequency Intermittent   Aggravating Factors  night time   Pain Relieving Factors exercise                         [facility] Adult PT Treatment/Exercise - 12/31/15 0001      Neck Exercises: Theraband   Rows 20 reps  yellow tband    Horizontal ABduction 20 reps  yellow theraband     Neck Exercises: Seated   Other Seated Exercise seated cervical AROM 3 ways 3x20 seconds each   Other Seated Exercise --  yellow tband     Knee/Hip Exercises: Stretches   Active Hamstring Stretch Both;3 reps;20 seconds     Knee/Hip Exercises: Aerobic   Nustep Level 1 x 10 minutes  PT present to discuss exercises with pt     Knee/Hip Exercises: Standing   Heel Raises 2 sets;10 reps   Hip Abduction Stengthening;2 sets;10 reps  abdominal bracing   Abduction Limitations 2#   Hip Extension Stengthening;Both;2 sets;10 reps  abdominal bracing   Extension Limitations 2#   Rocker Board 3 minutes   Rebounder weight shifting 3 ways x 1 minute each     Knee/Hip Exercises: Seated   Long Arc Quad Both;2 sets;10 reps   Long Arc Quad Weight 2 lbs.   Ball Squeeze 20   Marching Strengthening;Both;2 sets;10 reps   [employer] 2 lbs.                  PT Short Term Goals - 12/29/15  [place] #1   Title understand tips to avoid falls   Time 4   Period Weeks   Status On-going     PT SHORT TERM GOAL #2   Title independent with flexibility exericses   Time 4   Period Weeks   Status On-going     PT SHORT TERM GOAL #3   Title independent with cervical and lumbar ROM exercises   Time 4   Period Weeks   Status On-going     PT SHORT TERM GOAL #4   Title sit to stand </= 25 sec   Time 4   Period Weeks   Status On-going     PT SHORT TERM GOAL #5   Title TUG score </= 15 sec   Time 4   Period Weeks   Status On-going           PT Long Term Goals - 12/29/15 1553      PT LONG TERM GOAL #1   Title independent with HEP   Time 8   Period Weeks   Status On-going     PT LONG TERM GOAL #2   Title sit to stand </= 18 sec with pain decreased >/= 50%   Time 8   Period Weeks   Status On-going     PT LONG TERM GOAL #3   Title TUG score </= 13 sec due to increased balance and increased LE  strength   Time 8   Period Weeks   Status On-going     PT LONG TERM GOAL #4   Title walk with pain </= 50% due to increased strength and mobility   Time 8   Period Weeks   Status On-going     PT LONG TERM GOAL #5   Title sleep with pain decreased >/= 50% due to improve cervical ROM   Time 8   Period Weeks   Status On-going               Plan - 12/31/15 1454    Clinical Impression Statement Pt with increased LBP this morning and this has now subsided with medication.  Pt is able to perform all exercises in the clinic for strength and endurance without limitation.  Pt tolerated 2# ankle weights today.  Pt has received education regarding fall prevention and is taking precautions to avoid falls.  Pt will continue to benefit from skilled PT for strength and endurance progression.     Rehab Potential Good   Clinical Impairments Affecting Rehab Potential history of lumbar surgery, pelvic fracture and endometrial cancer   PT Frequency 2x / week   PT Duration 8 weeks   PT Treatment/Interventions Electrical Stimulation;Cryotherapy;Gait training;Stair training;Ultrasound;Therapeutic activities;Traction;Therapeutic exercise;Balance training;Neuromuscular re-education;Patient/family education;Passive range of motion;Manual techniques;Dry needling;Energy conservation   PT Next Visit Plan balance exercises; cervical ROM; flexibility exercises, modalities as needed; nustep   Consulted and Agree with Plan of Care Patient      Patient will benefit from skilled therapeutic intervention in order to improve the following deficits and impairments:  Pain, Decreased mobility, Decreased strength, Impaired flexibility, Decreased balance, Decreased activity tolerance, Decreased endurance, Increased muscle spasms, Decreased range of motion, Difficulty walking, Increased fascial restricitons  Visit Diagnosis: Muscle weakness (generalized)  Chronic left-sided low back pain, with sciatica presence  unspecified  Other abnormalities of gait and mobility     Problem List Patient Active Problem List   Diagnosis Date Noted  . Long term current  use of anticoagulant therapy 09/27/2014  . CKD (chronic kidney disease) stage 3, GFR 30-59 ml/min 09/20/2014  . Diet-controlled diabetes mellitus ([place]) 09/20/2014  . Pelvic fracture ([place]) 09/17/2014  . Sacral fracture, closed ([place]) 09/17/2014  . Warfarin anticoagulation 09/17/2014  . Warfarin-induced coagulopathy ([place]) 06/23/2013  . Renal insufficiency 06/23/2013  . Chest pain 06/22/2013  . Other dysphagia 07/24/2012  . Atrial fibrillation ([place])   . Diabetes mellitus without complication ([place])   . Hypertension   . Hyperlipidemia   . Chest pain      Sigurd Sos, PT 12/31/15 3:25 PM  [facility] [address], [place], [place], [address] Phone: [phone]   Fax:  [phone]  Name: Janice [name] MRN: [id number] Date of Birth: [birth year]

## 2016-01-05 ENCOUNTER — Ambulatory Visit: Payer: Medicare Other

## 2016-01-05 DIAGNOSIS — M6281 Muscle weakness (generalized): Secondary | ICD-10-CM | POA: Diagnosis not present

## 2016-01-05 DIAGNOSIS — R2689 Other abnormalities of gait and mobility: Secondary | ICD-10-CM

## 2016-01-05 NOTE — Therapy (Signed)
[facility] [address] [place], [place], [address] Phone: [phone]   Fax:  [phone]  Physical Therapy Treatment  Patient Details  Name: Janice [name] MRN: [id number] Date of Birth: 21-Apr-1931 Referring Provider: Dr. Basil Dess  Encounter Date: 01/05/2016      PT End of Session - 01/05/16 1523    Visit Number 6   Number of Visits 10   Date for PT Re-Evaluation 02/12/16   Authorization Type g-code 10th visit   PT Start Time [id number]   PT Stop Time 1524   PT Time Calculation (min) 39 min   Activity Tolerance Patient tolerated treatment well   Behavior During Therapy [facility] for tasks assessed/performed      Past Medical History:  Diagnosis Date  . Allergic rhinitis   . Atrial fibrillation ([place])   . Cataracts, bilateral   . Chest pain   . CKD (chronic kidney disease)   . Diabetes mellitus   . Endometrial cancer ([place])   . GERD (gastroesophageal reflux disease)   . Hyperlipidemia   . Hypertension   . Lymphedema   . Obesity   . Osteoarthritis     Past Surgical History:  Procedure Laterality Date  . APPENDECTOMY    . BACK SURGERY    . CARDIAC CATHETERIZATION  06/2009   REVEALED SMOOTH AND NORMAL CORONARY ARTERIES  . CHOLECYSTECTOMY    . VAGINAL HYSTERECTOMY      There were no vitals filed for this visit.      Subjective Assessment - 01/05/16 1448    Subjective No significant back or neck pain today.  Balance feels about the same but it has been going on for a long time.     Currently in Pain? No/denies            [facility] PT Assessment - 01/05/16 0001      Standardized Balance Assessment   Five times sit to stand comments  23 seconds     Timed Up and Go Test   TUG Normal TUG   Normal TUG (seconds) 17   TUG Comments high risk of falls                     [facility] Adult PT Treatment/Exercise - 01/05/16 0001      Neck Exercises: Theraband   Rows 20 reps  yellow tband   Horizontal ABduction 20 reps  yellow theraband     Neck Exercises: Seated   Other Seated Exercise seated cervical AROM 3 ways 3x20 seconds each   Other Seated Exercise --  yellow tband     Knee/Hip Exercises: Stretches   Active Hamstring Stretch Both;3 reps;20 seconds     Knee/Hip Exercises: Aerobic   Nustep Level 1 x 10 minutes  PT present to discuss exercises with pt     Knee/Hip Exercises: Standing   Heel Raises 2 sets;10 reps   Hip Abduction Stengthening;2 sets;10 reps  abdominal bracing   Abduction Limitations 2#   Hip Extension Stengthening;Both;2 sets;10 reps  abdominal bracing   Extension Limitations 2#   Rocker Board 3 minutes   Rebounder --     Knee/Hip Exercises: Seated   Long Arc Quad Both;2 sets;10 reps   Long Arc Quad Weight 2 lbs.   Ball Squeeze 20   Marching Strengthening;Both;2 sets;10 reps   [employer] 2 lbs.                  PT Short Term  Goals - 01/05/16 1449      PT SHORT TERM GOAL #1   Title understand tips to avoid falls   Status Achieved     PT SHORT TERM GOAL #2   Title independent with flexibility exericses   Status Achieved     PT SHORT TERM GOAL #3   Title independent with cervical and lumbar ROM exercises   Status Achieved           PT Long Term Goals - 12/29/15 1553      PT LONG TERM GOAL #1   Title independent with HEP   Time 8   Period Weeks   Status On-going     PT LONG TERM GOAL #2   Title sit to stand </= 18 sec with pain decreased >/= 50%   Time 8   Period Weeks   Status On-going     PT LONG TERM GOAL #3   Title TUG score </= 13 sec due to increased balance and increased LE strength   Time 8   Period Weeks   Status On-going     PT LONG TERM GOAL #4   Title walk with pain </= 50% due to increased strength and mobility   Time 8   Period Weeks   Status On-going     PT LONG TERM GOAL #5   Title sleep with pain decreased >/= 50% due to improve cervical ROM   Time 8   Period Weeks    Status On-going               Plan - 01/05/16 1508    Clinical Impression Statement Pt with minimal to no neck or LBP today and reports that this is intermittent.  TUG is 17 seconds with use of cane and 5x sit to stand 23 seconds.  Pt is able to perform all exercises in the clinic for strength and endurance without limitaiton and tolerated 2# ankle weights.  Pt will continue to benefit from skilled PT for strength and endurance progression.      Rehab Potential Good   Clinical Impairments Affecting Rehab Potential history of lumbar surgery, pelvic fracture and endometrial cancer   PT Frequency 2x / week   PT Duration 8 weeks   PT Treatment/Interventions Electrical Stimulation;Cryotherapy;Gait training;Stair training;Ultrasound;Therapeutic activities;Traction;Therapeutic exercise;Balance training;Neuromuscular re-education;Patient/family education;Passive range of motion;Manual techniques;Dry needling;Energy conservation   PT Next Visit Plan balance exercises; cervical ROM; flexibility exercises, modalities as needed; nustep   Consulted and Agree with Plan of Care Patient      Patient will benefit from skilled therapeutic intervention in order to improve the following deficits and impairments:  Pain, Decreased mobility, Decreased strength, Impaired flexibility, Decreased balance, Decreased activity tolerance, Decreased endurance, Increased muscle spasms, Decreased range of motion, Difficulty walking, Increased fascial restricitons  Visit Diagnosis: Muscle weakness (generalized)  Other abnormalities of gait and mobility     Problem List Patient Active Problem List   Diagnosis Date Noted  . Long term current use of anticoagulant therapy 09/27/2014  . CKD (chronic kidney disease) stage 3, GFR 30-59 ml/min 09/20/2014  . Diet-controlled diabetes mellitus ([place]) 09/20/2014  . Pelvic fracture ([place]) 09/17/2014  . Sacral fracture, closed ([place]) 09/17/2014  . Warfarin anticoagulation  09/17/2014  . Warfarin-induced coagulopathy ([place]) 06/23/2013  . Renal insufficiency 06/23/2013  . Chest pain 06/22/2013  . Other dysphagia 07/24/2012  . Atrial fibrillation ([place])   . Diabetes mellitus without complication ([place])   . Hypertension   . Hyperlipidemia   . Chest  pain     Sigurd Sos, PT 01/05/16 3:28 PM  [facility] [address] [place], [place], [address] Phone: [phone]   Fax:  [phone]  Name: Janice [name] MRN: [id number] Date of Birth: March 18, 1931

## 2016-01-07 ENCOUNTER — Ambulatory Visit: Payer: Medicare Other

## 2016-01-07 DIAGNOSIS — R2689 Other abnormalities of gait and mobility: Secondary | ICD-10-CM

## 2016-01-07 DIAGNOSIS — M542 Cervicalgia: Secondary | ICD-10-CM

## 2016-01-07 DIAGNOSIS — M6281 Muscle weakness (generalized): Secondary | ICD-10-CM | POA: Diagnosis not present

## 2016-01-07 NOTE — Therapy (Signed)
[facility] [address] [place], [place], [address] Phone: [phone]   Fax:  [phone]  Physical Therapy Treatment  Patient Details  Name: Janice [name] MRN: [id number] Date of Birth: January 25, 1932 Referring Provider: Dr. Basil Dess  Encounter Date: 01/07/2016      PT End of Session - 01/07/16 1526    Visit Number 7   Number of Visits 10   Date for PT Re-Evaluation 02/12/16   Authorization Type g-code 10th visit   PT Start Time [id number]   PT Stop Time 1526   PT Time Calculation (min) 43 min   Activity Tolerance Patient tolerated treatment well   Behavior During Therapy [facility] for tasks assessed/performed      Past Medical History:  Diagnosis Date  . Allergic rhinitis   . Atrial fibrillation ([place])   . Cataracts, bilateral   . Chest pain   . CKD (chronic kidney disease)   . Diabetes mellitus   . Endometrial cancer ([place])   . GERD (gastroesophageal reflux disease)   . Hyperlipidemia   . Hypertension   . Lymphedema   . Obesity   . Osteoarthritis     Past Surgical History:  Procedure Laterality Date  . APPENDECTOMY    . BACK SURGERY    . CARDIAC CATHETERIZATION  06/2009   REVEALED SMOOTH AND NORMAL CORONARY ARTERIES  . CHOLECYSTECTOMY    . VAGINAL HYSTERECTOMY      There were no vitals filed for this visit.      Subjective Assessment - 01/07/16 1443    Subjective Fairly constant LBP.     Currently in Pain? Yes   Pain Score 2    Pain Location Back   Pain Orientation Right;Left;Lower   Pain Descriptors / Indicators Dull   Pain Type Chronic pain   Pain Score 2   Pain Location Neck   Pain Orientation Right;Left   Pain Descriptors / Indicators Aching   Pain Type Chronic pain                         [facility] Adult PT Treatment/Exercise - 01/07/16 0001      Neck Exercises: Theraband   Rows 20 reps  red theraband   Horizontal ABduction 20 reps  red theraband     Neck  Exercises: Seated   Other Seated Exercise seated cervical AROM 3 ways 3x20 seconds each   Other Seated Exercise --  yellow tband     Knee/Hip Exercises: Standing   Heel Raises 2 sets;10 reps   Hip Abduction Stengthening;2 sets;10 reps  abdominal bracing, tactile cues for posture   Abduction Limitations 2#   Hip Extension Stengthening;Both;2 sets;10 reps  abdominal bracing   Extension Limitations 2#   Rebounder weight shifting 3 ways x 1 minute each     Knee/Hip Exercises: Seated   Long Arc Quad Both;2 sets;10 reps   Long Arc Quad Weight 2 lbs.   Ball Squeeze 20                  PT Short Term Goals - 01/05/16 1449      PT SHORT TERM GOAL #1   Title understand tips to avoid falls   Status Achieved     PT SHORT TERM GOAL #2   Title independent with flexibility exericses   Status Achieved     PT SHORT TERM GOAL #3   Title independent with cervical and lumbar ROM exercises  Status Achieved           PT Long Term Goals - 12/29/15 1553      PT LONG TERM GOAL #1   Title independent with HEP   Time 8   Period Weeks   Status On-going     PT LONG TERM GOAL #2   Title sit to stand </= 18 sec with pain decreased >/= 50%   Time 8   Period Weeks   Status On-going     PT LONG TERM GOAL #3   Title TUG score </= 13 sec due to increased balance and increased LE strength   Time 8   Period Weeks   Status On-going     PT LONG TERM GOAL #4   Title walk with pain </= 50% due to increased strength and mobility   Time 8   Period Weeks   Status On-going     PT LONG TERM GOAL #5   Title sleep with pain decreased >/= 50% due to improve cervical ROM   Time 8   Period Weeks   Status On-going               Plan - 01/07/16 1459    Clinical Impression Statement Pt with minimal LBP today that is constant.  TUG this week was 17 seconds with use of cane and 5x sit to stand 23 seconds.  Pt tolerated all exercise in the clinic without significant limitaiton with  minor cueing needed for technique.  Pt will continue to benefit from skilled PT for strength, endurance and balance exercises to improve safety and endurance at home and in the community.     Rehab Potential Good   Clinical Impairments Affecting Rehab Potential history of lumbar surgery, pelvic fracture and endometrial cancer   PT Frequency 2x / week   PT Duration 8 weeks   PT Treatment/Interventions Electrical Stimulation;Cryotherapy;Gait training;Stair training;Ultrasound;Therapeutic activities;Traction;Therapeutic exercise;Balance training;Neuromuscular re-education;Patient/family education;Passive range of motion;Manual techniques;Dry needling;Energy conservation   PT Next Visit Plan balance exercises; cervical ROM; flexibility exercises, modalities as needed; nustep   Consulted and Agree with Plan of Care Patient      Patient will benefit from skilled therapeutic intervention in order to improve the following deficits and impairments:  Pain, Decreased mobility, Decreased strength, Impaired flexibility, Decreased balance, Decreased activity tolerance, Decreased endurance, Increased muscle spasms, Decreased range of motion, Difficulty walking, Increased fascial restricitons  Visit Diagnosis: Muscle weakness (generalized)  Other abnormalities of gait and mobility  Cervicalgia     Problem List Patient Active Problem List   Diagnosis Date Noted  . Long term current use of anticoagulant therapy 09/27/2014  . CKD (chronic kidney disease) stage 3, GFR 30-59 ml/min 09/20/2014  . Diet-controlled diabetes mellitus ([place]) 09/20/2014  . Pelvic fracture ([place]) 09/17/2014  . Sacral fracture, closed ([place]) 09/17/2014  . Warfarin anticoagulation 09/17/2014  . Warfarin-induced coagulopathy ([place]) 06/23/2013  . Renal insufficiency 06/23/2013  . Chest pain 06/22/2013  . Other dysphagia 07/24/2012  . Atrial fibrillation ([place])   . Diabetes mellitus without complication ([place])   . Hypertension   .  Hyperlipidemia   . Chest pain     Sigurd Sos, PT 01/07/16 3:27 PM  [facility] [address], [place], [place], [address] Phone: [phone]   Fax:  [phone]  Name: Janice [name] MRN: [id number] Date of Birth: December 20, 1931

## 2016-01-08 ENCOUNTER — Ambulatory Visit (INDEPENDENT_AMBULATORY_CARE_PROVIDER_SITE_OTHER): Payer: Medicare Other | Admitting: Specialist

## 2016-01-12 ENCOUNTER — Ambulatory Visit: Payer: Medicare Other | Admitting: Physical Therapy

## 2016-01-12 ENCOUNTER — Encounter: Payer: Self-pay | Admitting: Physical Therapy

## 2016-01-12 DIAGNOSIS — M6281 Muscle weakness (generalized): Secondary | ICD-10-CM

## 2016-01-12 DIAGNOSIS — R2689 Other abnormalities of gait and mobility: Secondary | ICD-10-CM

## 2016-01-12 DIAGNOSIS — M542 Cervicalgia: Secondary | ICD-10-CM

## 2016-01-12 NOTE — Therapy (Addendum)
[facility] [address], [place], [place], [address] Phone: [phone]   Fax:  [phone]  Physical Therapy Treatment  Patient Details  Name: Janice [name] MRN: [id number] Date of Birth: [birth year] Referring Provider: Dr. Basil Dess  Encounter Date: 01/12/2016      PT End of Session - 01/12/16 1530    Visit Number 8   Number of Visits 10   Date for PT Re-Evaluation 02/12/16   Authorization Type g-code 10th visit   PT Start Time 1528   PT Stop Time 1609   PT Time Calculation (min) 41 min   Activity Tolerance Patient tolerated treatment well   Behavior During Therapy [facility] for tasks assessed/performed      Past Medical History:  Diagnosis Date  . Allergic rhinitis   . Atrial fibrillation ([place])   . Cataracts, bilateral   . Chest pain   . CKD (chronic kidney disease)   . Diabetes mellitus   . Endometrial cancer ([place])   . GERD (gastroesophageal reflux disease)   . Hyperlipidemia   . Hypertension   . Lymphedema   . Obesity   . Osteoarthritis     Past Surgical History:  Procedure Laterality Date  . APPENDECTOMY    . BACK SURGERY    . CARDIAC CATHETERIZATION  06/2009   REVEALED SMOOTH AND NORMAL CORONARY ARTERIES  . CHOLECYSTECTOMY    . VAGINAL HYSTERECTOMY      There were no vitals filed for this visit.      Subjective Assessment - 01/12/16 1528    Subjective Pt reports back feeling better.    How long can you sit comfortably? decreased pain   How long can you stand comfortably? all the time   How long can you walk comfortably? painful   Patient Stated Goals decreased pain   Currently in Pain? Yes   Pain Score 1    Pain Location Back   Pain Orientation Right;Left;Lower   Pain Descriptors / Indicators Dull   Pain Type Chronic pain   Multiple Pain Sites Yes   Pain Score 1   Pain Location Neck   Pain Orientation Right;Left   Pain Descriptors / Indicators Aching   Pain Type Chronic  pain   Pain Onset More than a month ago   Pain Frequency Intermittent            [facility] PT Assessment - 01/12/16 0001      Standardized Balance Assessment   Five times sit to stand comments  27 seconds     Timed Up and Go Test   TUG Normal TUG   Normal TUG (seconds) 18   TUG Comments high risk of falls                     [facility] Adult PT Treatment/Exercise - 01/12/16 0001      Neck Exercises: Theraband   Rows 20 reps  red theraband   Horizontal ABduction 20 reps  red theraband     Neck Exercises: Seated   Other Seated Exercise seated cervical AROM 3 ways 3x20 seconds each     Knee/Hip Exercises: Standing   Heel Raises 2 sets;10 reps   Hip Flexion Stengthening;Both;2 sets;10 reps   Hip Flexion Limitations #2   Hip Abduction Stengthening;2 sets;10 reps  abdominal bracing, tactile cues for posture   Abduction Limitations 2#   Hip Extension Stengthening;Both;2 sets;10 reps  abdominal bracing   Extension Limitations 2#   Rebounder Marching,  weight shifting 3 ways x 1 minute each     Knee/Hip Exercises: Seated   Long Arc Quad Both;2 sets;10 reps   Long Arc Quad Weight 2 lbs.   Ball Squeeze 20                PT Education - 01/12/16 1601    Education provided Yes   Education Details LE strenghtening exercises   Person(s) Educated Patient   Methods Explanation;Demonstration;Handout   Comprehension Verbalized understanding          PT Short Term Goals - 01/12/16 1530      PT SHORT TERM GOAL #1   Title understand tips to avoid falls   Time 4   Period Weeks   Status Achieved     PT SHORT TERM GOAL #2   Title independent with flexibility exericses   Time 4   Period Weeks   Status Achieved     PT SHORT TERM GOAL #3   Title independent with cervical and lumbar ROM exercises   Time 4   Period Weeks   Status Achieved     PT SHORT TERM GOAL #4   Title sit to stand </= 25 sec   Time 4   Period Weeks   Status On-going     PT SHORT  TERM GOAL #5   Title TUG score </= 15 sec   Time 4   Period Weeks   Status On-going           PT Long Term Goals - 01/12/16 1531      PT LONG TERM GOAL #1   Title independent with HEP   Time 8   Period Weeks   Status On-going     PT LONG TERM GOAL #2   Title sit to stand </= 18 sec with pain decreased >/= 50%   Time 8   Period Weeks   Status On-going     PT LONG TERM GOAL #3   Title TUG score </= 13 sec due to increased balance and increased LE strength   Time 8   Period Weeks   Status On-going     PT LONG TERM GOAL #4   Title walk with pain </= 50% due to increased strength and mobility   Time 8   Period Weeks   Status On-going  Pt reports 10% better     PT LONG TERM GOAL #5   Title sleep with pain decreased >/= 50% due to improve cervical ROM   Time 8   Period Weeks   Status On-going  5% better               Plan - 01/12/16 1602    Clinical Impression Statement Pt request to discontinue therapy due to financial reasons. Pt TUG was performed without cane (pt did not bring to clinic) and timed at 18 seconds. Pt sit to stand was performed in armless chair at 27 seonds. Pt reports necka and back both feeling better today. Pt reports being able to perform most ADL's without increased pain but is unable to stand or walk for long periods of time without increased pain.    Rehab Potential Good   Clinical Impairments Affecting Rehab Potential history of lumbar surgery, pelvic fracture and endometrial cancer   PT Frequency 2x / week   PT Duration 8 weeks   PT Treatment/Interventions Electrical Stimulation;Cryotherapy;Gait training;Stair training;Ultrasound;Therapeutic activities;Traction;Therapeutic exercise;Balance training;Neuromuscular re-education;Patient/family education;Passive range of motion;Manual techniques;Dry needling;Energy conservation   PT Next Visit Plan  balance exercises; cervical ROM; flexibility exercises, modalities as needed; nustep    Consulted and Agree with Plan of Care Patient      Patient will benefit from skilled therapeutic intervention in order to improve the following deficits and impairments:  Pain, Decreased mobility, Decreased strength, Impaired flexibility, Decreased balance, Decreased activity tolerance, Decreased endurance, Increased muscle spasms, Decreased range of motion, Difficulty walking, Increased fascial restricitons  Visit Diagnosis: Muscle weakness (generalized)  Other abnormalities of gait and mobility  Cervicalgia   G-codes: Change of position CK: goal status CJ: D/C status   Problem List Patient Active Problem List   Diagnosis Date Noted  . Long term current use of anticoagulant therapy 09/27/2014  . CKD (chronic kidney disease) stage 3, GFR 30-59 ml/min 09/20/2014  . Diet-controlled diabetes mellitus ([place]) 09/20/2014  . Pelvic fracture ([place]) 09/17/2014  . Sacral fracture, closed ([place]) 09/17/2014  . Warfarin anticoagulation 09/17/2014  . Warfarin-induced coagulopathy ([place]) 06/23/2013  . Renal insufficiency 06/23/2013  . Chest pain 06/22/2013  . Other dysphagia 07/24/2012  . Atrial fibrillation ([place])   . Diabetes mellitus without complication ([place])   . Hypertension   . Hyperlipidemia   . Chest pain     Mikle Bosworth PTA 01/12/2016, 4:14 PM PHYSICAL THERAPY DISCHARGE SUMMARY  Visits from Start of Care: 8  Current functional level related to goals / functional outcomes: See above for current status.  Pt requested D/C due to financial concerns.     Remaining deficits: See above for most current status.     Education / Equipment: HEP, Plan: Patient agrees to discharge.  Patient goals were partially met. Patient is being discharged due to financial reasons.  ?????        Sigurd Sos, PT 01/13/16 12:56 PM   [facility] [address], [place], [place], [address] Phone: [phone]   Fax:   [phone]  Name: Janice [name] MRN: [id number] Date of Birth: February 08, 1932

## 2016-01-12 NOTE — Patient Instructions (Signed)
KNEE: Extension, Long Arc Quads - Sitting    Raise leg until knee is straight. _10__ reps per set, __2_ sets per day, _3__ days per week  Copyright  VHI. All rights reserved.   Adduction: Hip - Knees Together (Hook-Lying)    Lie or sit with hips and knees bent, towel roll between knees. Push knees together. Hold for _5__ seconds. Rest for _5__ seconds. Repeat _10__ times. Do _1__ times a day.   Copyright  VHI. All rights reserved.   High Stepping in Place (Sitting)    Sitting, alternately lift knees as high as possible. Keep torso erect. Repeat ___10_ times, each leg.  Copyright  VHI. All rights reserved.  Jeanie Sewer PTA [facility] [address] [place], [place] [address] Phone # [phone] Fax [phone]

## 2016-01-14 ENCOUNTER — Encounter: Payer: Medicare Other | Admitting: Physical Therapy

## 2016-01-19 ENCOUNTER — Encounter: Payer: Medicare Other | Admitting: Physical Therapy

## 2016-01-21 ENCOUNTER — Encounter: Payer: Medicare Other | Admitting: Physical Therapy

## 2016-01-26 ENCOUNTER — Encounter: Payer: Medicare Other | Admitting: Physical Therapy

## 2016-02-09 ENCOUNTER — Ambulatory Visit (INDEPENDENT_AMBULATORY_CARE_PROVIDER_SITE_OTHER): Payer: Medicare Other | Admitting: Specialist

## 2016-04-27 ENCOUNTER — Ambulatory Visit (INDEPENDENT_AMBULATORY_CARE_PROVIDER_SITE_OTHER): Payer: Self-pay

## 2016-04-27 ENCOUNTER — Ambulatory Visit (INDEPENDENT_AMBULATORY_CARE_PROVIDER_SITE_OTHER): Payer: Medicare Other | Admitting: Orthopedic Surgery

## 2016-04-27 ENCOUNTER — Encounter (INDEPENDENT_AMBULATORY_CARE_PROVIDER_SITE_OTHER): Payer: Self-pay | Admitting: Orthopedic Surgery

## 2016-04-27 DIAGNOSIS — G8929 Other chronic pain: Secondary | ICD-10-CM | POA: Diagnosis not present

## 2016-04-27 DIAGNOSIS — M25562 Pain in left knee: Secondary | ICD-10-CM

## 2016-04-27 DIAGNOSIS — M25561 Pain in right knee: Secondary | ICD-10-CM

## 2016-04-27 MED ORDER — LIDOCAINE HCL 1 % IJ SOLN
5.0000 mL | INTRAMUSCULAR | Status: AC | PRN
  Administered 2016-04-27: 5 mL

## 2016-04-27 MED ORDER — METHYLPREDNISOLONE ACETATE 40 MG/ML IJ SUSP
40.0000 mg | INTRAMUSCULAR | Status: AC | PRN
  Administered 2016-04-27: 40 mg via INTRA_ARTICULAR

## 2016-04-27 MED ORDER — BUPIVACAINE HCL 0.25 % IJ SOLN
4.0000 mL | INTRAMUSCULAR | Status: AC | PRN
  Administered 2016-04-27: 4 mL via INTRA_ARTICULAR

## 2016-04-27 NOTE — Progress Notes (Signed)
Office Visit Note   Patient: Janice [name]           Date of Birth: [birth year]           MRN: [id number] Visit Date: 04/27/2016 Requested by: Harlan Stains, MD [place], [place] [address] PCP: Vidal Schwalbe, MD  Subjective: Chief Complaint  Patient presents with  . Left Knee - Pain  . Right Knee - Pain    HPI Janice [name] is an 83 year old patient with bilateral knee arthritis.  Left is hurting more than right.  She's had pain for several years.  It was worse last week.  Had an injection 05/16/2015 in the left knee which helped.  It does hurt her to walk.  She uses a cane.             Review of Systems All systems reviewed are negative as they relate to the chief complaint within the history of present illness.  Patient denies  fevers or chills.    Assessment & Plan: Visit Diagnoses:  1. Chronic pain of left knee   2. Chronic pain of right knee     Plan: Impression is left knee arthritis with very good response to an injection almost a year ago.  At injections repeated today.  There is no effusion in the knee so aspiration not required.  I will see her back as needed.  Could consider injection in the right knee show that one become symptomatic.  Follow-Up Instructions: Return if symptoms worsen or fail to improve.   Orders:  No orders of the defined types were placed in this encounter.  No orders of the defined types were placed in this encounter.     Procedures: Large Joint Inj Date/Time: 04/27/2016 3:30 PM Performed by: Meredith Pel Authorized by: Meredith Pel   Consent Given by:  Patient Site marked: the procedure site was marked   Timeout: prior to procedure the correct patient, procedure, and site was verified   Indications:  Pain, joint swelling and diagnostic evaluation Location:  Knee Site:  L knee Prep: patient was prepped and draped in usual sterile fashion   Needle Size:  18 G Needle Length:  1.5 inches Approach:   Superolateral Ultrasound Guidance: No   Fluoroscopic Guidance: No   Arthrogram: No   Medications:  5 mL lidocaine 1 %; 4 mL bupivacaine 0.25 %; 40 mg methylPREDNISolone acetate 40 MG/ML Patient tolerance:  Patient tolerated the procedure well with no immediate complications     Clinical Data: No additional findings.  Objective: Vital Signs: There were no vitals taken for this visit.  Physical Exam   Constitutional: Patient appears well-developed HEENT:  Head: Normocephalic Eyes:EOM are normal Neck: Normal range of motion Cardiovascular: Normal rate Pulmonary/chest: Effort normal Neurologic: Patient is alert Skin: Skin is warm Psychiatric: Patient has normal mood and affect    Ortho ExamOrthopedic exam demonstrates some edema in both legs.  She has about a 5-10 flexion contracture in both knees.  Flexion is to almost 90.  Extensor mechanism is intact on the left.  Collateral crucial ligaments are stable.  There is no groin pain with internal rotation of the leg.    Specialty Comments:  No specialty comments available.  Imaging: No results found.   PMFS History: Patient Active Problem List   Diagnosis Date Noted  . Chronic pain of left knee 04/27/2016  . Chronic pain of right knee 04/27/2016  . Long term current use of anticoagulant  therapy 09/27/2014  . CKD (chronic kidney disease) stage 3, GFR 30-59 ml/min 09/20/2014  . Diet-controlled diabetes mellitus ([place]) 09/20/2014  . Pelvic fracture ([place]) 09/17/2014  . Sacral fracture, closed ([place]) 09/17/2014  . Warfarin anticoagulation 09/17/2014  . Warfarin-induced coagulopathy ([place]) 06/23/2013  . Renal insufficiency 06/23/2013  . Chest pain 06/22/2013  . Other dysphagia 07/24/2012  . Atrial fibrillation ([place])   . Diabetes mellitus without complication ([place])   . Hypertension   . Hyperlipidemia   . Chest pain    Past Medical History:  Diagnosis Date  . Allergic rhinitis   . Atrial fibrillation ([place])   .  Cataracts, bilateral   . Chest pain   . CKD (chronic kidney disease)   . Diabetes mellitus   . Endometrial cancer ([place])   . GERD (gastroesophageal reflux disease)   . Hyperlipidemia   . Hypertension   . Lymphedema   . Obesity   . Osteoarthritis     Family History  Problem Relation Age of Onset  . Colon cancer Mother   . Kidney cancer Father   . Lung cancer Sister   . Colon cancer Sister     x2    Past Surgical History:  Procedure Laterality Date  . APPENDECTOMY    . BACK SURGERY    . CARDIAC CATHETERIZATION  06/2009   REVEALED SMOOTH AND NORMAL CORONARY ARTERIES  . CHOLECYSTECTOMY    . VAGINAL HYSTERECTOMY     Social History   Occupational History  . Retired    Social History Main Topics  . Smoking status: Never Smoker  . Smokeless tobacco: Never Used  . Alcohol use No  . Drug use: No  . Sexual activity: Not on file

## 2016-06-18 ENCOUNTER — Encounter: Payer: Self-pay | Admitting: Cardiovascular Disease

## 2016-07-01 ENCOUNTER — Ambulatory Visit (INDEPENDENT_AMBULATORY_CARE_PROVIDER_SITE_OTHER): Payer: Medicare Other | Admitting: Orthopedic Surgery

## 2016-07-01 ENCOUNTER — Encounter (INDEPENDENT_AMBULATORY_CARE_PROVIDER_SITE_OTHER): Payer: Self-pay | Admitting: Orthopedic Surgery

## 2016-07-01 DIAGNOSIS — M25562 Pain in left knee: Principal | ICD-10-CM

## 2016-07-01 DIAGNOSIS — G8929 Other chronic pain: Secondary | ICD-10-CM

## 2016-07-02 DIAGNOSIS — M25562 Pain in left knee: Secondary | ICD-10-CM

## 2016-07-02 DIAGNOSIS — G8929 Other chronic pain: Secondary | ICD-10-CM

## 2016-07-02 MED ORDER — LIDOCAINE HCL 1 % IJ SOLN
5.0000 mL | INTRAMUSCULAR | Status: AC | PRN
  Administered 2016-07-02: 5 mL

## 2016-07-02 MED ORDER — METHYLPREDNISOLONE ACETATE 40 MG/ML IJ SUSP
40.0000 mg | INTRAMUSCULAR | Status: AC | PRN
  Administered 2016-07-02: 40 mg via INTRA_ARTICULAR

## 2016-07-02 MED ORDER — BUPIVACAINE HCL 0.25 % IJ SOLN
4.0000 mL | INTRAMUSCULAR | Status: AC | PRN
  Administered 2016-07-02: 4 mL via INTRA_ARTICULAR

## 2016-07-02 NOTE — Progress Notes (Addendum)
Office Visit Note   Patient: Janice [name]           Date of Birth: [birth year]           MRN: [id number] Visit Date: 07/01/2016 Requested by: Janice Stains, MD [place], [place] [address] PCP: Janice Schwalbe, MD  Subjective: Chief Complaint  Patient presents with  . Left Knee - Pain    HPI: [name] is a 83 year old amatory female with left knee arthritis.  She had an injection 04/27/2016 which is 2 months ago.  Last had Synvisc one in 2012.  Those old records are reviewed.  She's been having some recurrent pain but did very well with the last cortisone injection.  No real mechanical symptoms.              ROS: All systems reviewed are negative as they relate to the chief complaint within the history of present illness.  Patient denies  fevers or chills.   Assessment & Plan: Visit Diagnoses:  1. Chronic pain of left knee     Plan: All systems reviewed are negative as they relate to the chief complaint within the history of present illness.  Patient denies  fevers or chills. Impression is refractory left knee arthritis plan injection performed today with preapproval of Synvisc one pending.  Once this shot wears off we should try a gel injection and get her on the schedule of alternating gel and cortisone shot injections.  Follow-Up Instructions: No Follow-up on file.   Orders:  No orders of the defined types were placed in this encounter.  No orders of the defined types were placed in this encounter.     Procedures: Large Joint Inj Date/Time: 07/02/2016 5:30 PM Performed by: Janice [name] Authorized by: Janice [name]   Consent Given by:  Patient Site marked: the procedure site was marked   Timeout: prior to procedure the correct patient, procedure, and site was verified   Indications:  Pain, joint swelling and diagnostic evaluation Location:  Knee Site:  L knee Prep: patient was prepped and draped in usual sterile fashion     Needle Size:  18 G Needle Length:  1.5 inches Approach:  Superolateral Ultrasound Guidance: No   Fluoroscopic Guidance: No   Arthrogram: No   Medications:  5 mL lidocaine 1 %; 4 mL bupivacaine 0.25 %; 40 mg methylPREDNISolone acetate 40 MG/ML Patient tolerance:  Patient tolerated the procedure well with no immediate complications     Clinical Data: No additional findings.  Objective: Vital Signs: There were no vitals taken for this visit.  Physical Exam:   Constitutional: Patient appears well-developed HEENT:  Head: Normocephalic Eyes:EOM are normal Neck: Normal range of motion Cardiovascular: Normal rate Pulmonary/chest: Effort normal Neurologic: Patient is alert Skin: Skin is warm Psychiatric: Patient has normal mood and affect    Ortho Exam: Orthopedic exam demonstrates about a 10-12 flexion contracture on the left with intact ankle dorsi flexion plantar flexion strength bilateral leg swelling is present about 1+ pitting edema is noted.  No groin pain with internal/external rotation of the leg.  No other masses lymph adenopathy or skin changes noted in the left knee region.  She does have patellofemoral crepitus and flexion just past 90.  Extensor mechanism is intact on the left  Specialty Comments:  No specialty comments available.  Imaging: No results found.   PMFS History: Patient Active Problem List   Diagnosis Date Noted  . Chronic pain of  left knee 04/27/2016  . Chronic pain of right knee 04/27/2016  . Long term current use of anticoagulant therapy 09/27/2014  . CKD (chronic kidney disease) stage 3, GFR 30-59 ml/min 09/20/2014  . Diet-controlled diabetes mellitus ([place]) 09/20/2014  . Pelvic fracture ([place]) 09/17/2014  . Sacral fracture, closed ([place]) 09/17/2014  . Warfarin anticoagulation 09/17/2014  . Warfarin-induced coagulopathy ([place]) 06/23/2013  . Renal insufficiency 06/23/2013  . Chest pain 06/22/2013  . Other dysphagia 07/24/2012  . Atrial  fibrillation ([place])   . Diabetes mellitus without complication ([place])   . Hypertension   . Hyperlipidemia   . Chest pain    Past Medical History:  Diagnosis Date  . Allergic rhinitis   . Atrial fibrillation ([place])   . Cataracts, bilateral   . Chest pain   . CKD (chronic kidney disease)   . Diabetes mellitus   . Endometrial cancer ([place])   . GERD (gastroesophageal reflux disease)   . Hyperlipidemia   . Hypertension   . Lymphedema   . Obesity   . Osteoarthritis     Family History  Problem Relation Age of Onset  . Lung cancer Sister   . Colon cancer Mother   . Kidney cancer Father   . Colon cancer Sister     x2    Past Surgical History:  Procedure Laterality Date  . APPENDECTOMY    . BACK SURGERY    . CARDIAC CATHETERIZATION  06/2009   REVEALED SMOOTH AND NORMAL CORONARY ARTERIES  . CHOLECYSTECTOMY    . VAGINAL HYSTERECTOMY     Social History   Occupational History  . Retired    Social History Main Topics  . Smoking status: Never Smoker  . Smokeless tobacco: Never Used  . Alcohol use No  . Drug use: No  . Sexual activity: Not on file

## 2016-07-07 ENCOUNTER — Ambulatory Visit (INDEPENDENT_AMBULATORY_CARE_PROVIDER_SITE_OTHER): Payer: Medicare Other | Admitting: Cardiovascular Disease

## 2016-07-07 ENCOUNTER — Encounter: Payer: Self-pay | Admitting: Cardiovascular Disease

## 2016-07-07 VITALS — BP 120/70 | HR 61 | Ht 66.0 in | Wt 184.8 lb

## 2016-07-07 DIAGNOSIS — I10 Essential (primary) hypertension: Secondary | ICD-10-CM

## 2016-07-07 DIAGNOSIS — I480 Paroxysmal atrial fibrillation: Secondary | ICD-10-CM | POA: Diagnosis not present

## 2016-07-07 NOTE — Patient Instructions (Signed)

## 2016-07-07 NOTE — Progress Notes (Signed)
Genelle Gather Date of Birth  [birth year] [place]  [address]    [employer]   [place], [place]  [address]    [place], [place]  [address] [phone]  Fax  [phone]  [phone]  Fax [phone]  List: 1. Atrial fibrillation 2. Diabetes mellitus  3. Hypertension 4. Hyperlipidemia 5. Chest pain-cardiac catheterization in April, 2011 revealed smooth and normal coronary arteries  History of Present Illness:  Janice [name] is a 83 yo with a history of intermittent atrial fibrillation. She has been in normal sinus rhythm for the past several years. She also has a history of diabetes mellitus, hypertension, and hyperlipidemia. She's had episodes of chest pain in the past but has normal coronary arteries by heart catheterization in 2011.  She has not been exercising as much as she would like.  She used to go to water aerobics but has not been in a while  June 12, 2015: Janice [name] is seen again after a 3 year absence.    Has known PAF Doing well.   No urgent issues   July 07, 2016:  Doing well.  Has gained some weight .  Not exercising as much .   Current Outpatient Prescriptions on File Prior to Visit  Medication Sig Dispense Refill  . aspirin 81 MG tablet Take 81 mg by mouth daily.      Marland Kitchen geriatric multivitamins-minerals (ELDERTONIC/GEVRABON) ELIX Take 15 mLs by mouth 2 (two) times daily.    . irbesartan (AVAPRO) 300 MG tablet Take 300 mg by mouth daily.     . LUTEIN PO Take 1 tablet by mouth daily.    . metoprolol (LOPRESSOR) 50 MG tablet Take 50 mg by mouth 2 (two) times daily.     . nitroGLYCERIN (NITROSTAT) 0.4 MG SL tablet Place 0.4 mg under the tongue every 5 (five) minutes as needed for chest pain.     Marland Kitchen oxyCODONE (ROXICODONE) 5 MG immediate release tablet Take 1 tablet (5 mg total) by mouth every 4 (four) hours as needed for severe pain. 30 tablet 0  . simvastatin (ZOCOR) 40 MG tablet Take 20 mg by mouth daily at  6 PM.    . warfarin (COUMADIN) 2.5 MG tablet Take 1.25 mg by mouth daily.      No current facility-administered medications on file prior to visit.     Allergies  Allergen Reactions  . Demerol [Meperidine] Nausea Only    And vertigo  . Meperidine Hcl     REACTION: nausea and vomiting    Past Medical History:  Diagnosis Date  . Allergic rhinitis   . Atrial fibrillation ([place])   . Cataracts, bilateral   . Chest pain   . CKD (chronic kidney disease)   . Diabetes mellitus   . Endometrial cancer ([place])   . GERD (gastroesophageal reflux disease)   . Hyperlipidemia   . Hypertension   . Lymphedema   . Obesity   . Osteoarthritis     Past Surgical History:  Procedure Laterality Date  . APPENDECTOMY    . BACK SURGERY    . CARDIAC CATHETERIZATION  06/2009   REVEALED SMOOTH AND NORMAL CORONARY ARTERIES  . CHOLECYSTECTOMY    . VAGINAL HYSTERECTOMY      History  Smoking Status  . Never Smoker  Smokeless Tobacco  . Never Used    History  Alcohol Use No    Family History  Problem Relation Age of  Onset  . Lung cancer Sister   . Colon cancer Mother   . Kidney cancer Father   . Colon cancer Sister     x2    Reviw of Systems:  Reviewed in the HPI.  All other systems are negative.  Physical Exam: Blood pressure 120/70, pulse 61, height 5\' 6"  (1.676 m), weight 184 lb 12.8 oz (83.8 kg), SpO2 96 %.  General: Well developed, well nourished, in no acute distress.  Head: Normocephalic, atraumatic, sclera non-icteric, mucus membranes are moist   Neck: Supple. Negative for carotid bruits. JVD not elevated.  Lungs: Clear bilaterally to auscultation without wheezes, rales, or rhonchi. Breathing is unlabored.  Heart: RRR with S1 S2. No murmurs, rubs, or gallops appreciated.  Abdomen: Soft, non-tender, non-distended with normoactive bowel sounds. No hepatomegaly. No rebound/guarding. No obvious abdominal masses.  Msk:  Strength and tone appear normal for age.  Extremities:  No clubbing or cyanosis. 1+  edema. Left > right .   Distal pedal pulses are 2+ and equal bilaterally.  Neuro: Alert and oriented X 3. Moves all extremities spontaneously.  Psych:  Responds to questions appropriately with a normal affect.   ECG: reviewed by me  July 07, 2016:   NSR at 46.   Normal.   Assessment / Plan:   1. Atrial fibrillation - has known PAF .  Doing well No changes in medical therapy.   Will see her in 1 year   2. Diabetes mellitus  3. Hypertension - BP is well controlled.   Continue same meds.   4. Hyperlipidemia - managed by her primary MD   5. Chest pain-cardiac catheterization in April, 2011 revealed smooth and normal coronary arteries    Mertie Moores, MD  07/07/2016 4:35 PM    [address] [place],  [place], [place]  [address] Pager [phone] Phone: [phone]; Fax: [phone]

## 2016-07-23 ENCOUNTER — Encounter: Payer: Self-pay | Admitting: Neurology

## 2016-07-23 ENCOUNTER — Ambulatory Visit (INDEPENDENT_AMBULATORY_CARE_PROVIDER_SITE_OTHER): Payer: Medicare Other | Admitting: Neurology

## 2016-07-23 ENCOUNTER — Other Ambulatory Visit: Payer: Medicare Other

## 2016-07-23 VITALS — HR 54 | Ht 66.0 in | Wt 181.0 lb

## 2016-07-23 DIAGNOSIS — G3184 Mild cognitive impairment, so stated: Secondary | ICD-10-CM | POA: Diagnosis not present

## 2016-07-23 LAB — TSH: TSH: 1.48 m[IU]/L

## 2016-07-23 MED ORDER — DONEPEZIL HCL 10 MG PO TABS: ORAL_TABLET | ORAL | 3 refills | Status: DC
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
## 2016-07-23 NOTE — Patient Instructions (Signed)
1. Schedule MRI brain without contrast 2. Bloodwork for TSH, B12 3. Start Aricept 10mg : Take 1/2 tablet daily for 1 month, then increase to 1 tablet daily 4. Control of blood pressure, cholesterol, as well as physical exercise and brain stimulation exercises are important for brain health 5. Follow-up in 6 months, call for any changes

## 2016-07-24 LAB — VITAMIN B12: VITAMIN B 12: 1033 pg/mL (ref 200–1100)

## 2016-07-27 ENCOUNTER — Ambulatory Visit (INDEPENDENT_AMBULATORY_CARE_PROVIDER_SITE_OTHER): Payer: Self-pay | Admitting: Specialist

## 2016-07-27 ENCOUNTER — Telehealth: Payer: Self-pay

## 2016-07-27 DIAGNOSIS — G3184 Mild cognitive impairment, so stated: Secondary | ICD-10-CM | POA: Insufficient documentation

## 2016-07-27 NOTE — Telephone Encounter (Signed)
Spoke with pt and relayed message below.  Pt appreciative

## 2016-07-27 NOTE — Telephone Encounter (Signed)
-----   Message from Cameron Sprang, MD sent at 07/27/2016  2:42 PM EDT ----- Pls let her know thyroid and b12 normal. Thanks

## 2016-07-29 ENCOUNTER — Encounter (INDEPENDENT_AMBULATORY_CARE_PROVIDER_SITE_OTHER): Payer: Self-pay | Admitting: Orthopedic Surgery

## 2016-07-29 ENCOUNTER — Ambulatory Visit (INDEPENDENT_AMBULATORY_CARE_PROVIDER_SITE_OTHER): Payer: Medicare Other | Admitting: Orthopedic Surgery

## 2016-07-29 ENCOUNTER — Ambulatory Visit (INDEPENDENT_AMBULATORY_CARE_PROVIDER_SITE_OTHER): Payer: Medicare Other

## 2016-07-29 DIAGNOSIS — M25551 Pain in right hip: Secondary | ICD-10-CM

## 2016-07-30 NOTE — Progress Notes (Signed)
Office Visit Note   Patient: Janice [name]           Date of Birth: 1932/02/20           MRN: [id number] Visit Date: 07/29/2016 Requested by: Janice Stains, MD [address], [place] [address] PCP: Janice Stains, MD  Subjective: Chief Complaint  Patient presents with  . Right Hip - Pain    HPI: Janice [name] is a 83 year old patient with right hip pain.  Reports pain of 3 weeks' duration.  No injury.  Been worse last week.  Reports generally constant pain which is more in her posterior gluteal region and none in her groin.  The pain will wake her from sleep at night.  Denies any radicular leg pain.  Pain radiates primarily from the top of her pelvis into her back region.  She is using a cane.  Hurts her more to walk in to sit.  She did have an MRI scan in 2017 which shows moderate impingement at L4-5 with good fusion at L5-S1.              ROS: All systems reviewed are negative as they relate to the chief complaint within the history of present illness.  Patient denies  fevers or chills.   Assessment & Plan: Visit Diagnoses:  1. Pain of right hip joint     Plan: Impression is right hip pain with essentially normal radiographs of the hip joint itself.  There is some heterotopic ossification in that right hip region.  This does not appear to be new.  Patient denies any history of injury to the right hip region.  I think this could be radiating pain from the back or potentially an early sacral stress fracture.  Hip-related symptoms relating to the heterotopic ossification also possible.  Really give this 3 more weeks to improve.  She does have some nerve root tension signs on the right but no paresthesias.  If she's no better than I would favor scanning of the pelvis to rule out sacral stress fracture with possible ESI's in the back to follow if that study is normal  Follow-Up Instructions: No Follow-up on file.   Orders:  Orders Placed This Encounter  Procedures  .  XR HIP UNILAT W OR W/O PELVIS 2-3 VIEWS RIGHT   No orders of the defined types were placed in this encounter.     Procedures: No procedures performed   Clinical Data: No additional findings.  Objective: Vital Signs: There were no vitals taken for this visit.  Physical Exam:   Constitutional: Patient appears well-developed HEENT:  Head: Normocephalic Eyes:EOM are normal Neck: Normal range of motion Cardiovascular: Normal rate Pulmonary/chest: Effort normal Neurologic: Patient is alert Skin: Skin is warm Psychiatric: Patient has normal mood and affect    Ortho Exam: Orthopedic exam demonstrates flexion contracture of left knee which is not new.  Patient does have nerve retention signs on the right negative on the left.  No paresthesias L1 S1 bilaterally.  Ankle dorsi and plantar flexion strength is intact.  There is no groin pain with internal/external rotation of either leg.  Does have some tenderness to palpation around the SI joint and posterior superiorly crest region on the right.  No other masses lymph adenopathy or skin changes noted in this region.  Specialty Comments:  No specialty comments available.  Imaging: Xr Hip Unilat W Or W/o Pelvis 2-3 Views Right  Result Date: 07/30/2016 AP pelvis lateral right hip  reviewed.  Heterotopic ossification present in the pericapsular region of the right hip.  Minimal to no arthritis within the hip joint itself.  No evidence of fracture.  No other bony abnormalities present.    PMFS History: Patient Active Problem List   Diagnosis Date Noted  . Mild cognitive impairment 07/27/2016  . Chronic pain of left knee 04/27/2016  . Chronic pain of right knee 04/27/2016  . Long term current use of anticoagulant therapy 09/27/2014  . CKD (chronic kidney disease) stage 3, GFR 30-59 ml/min 09/20/2014  . Diet-controlled diabetes mellitus ([place]) 09/20/2014  . Pelvic fracture ([place]) 09/17/2014  . Sacral fracture, closed ([place]) 09/17/2014    . Warfarin anticoagulation 09/17/2014  . Warfarin-induced coagulopathy ([place]) 06/23/2013  . Renal insufficiency 06/23/2013  . Chest pain 06/22/2013  . Other dysphagia 07/24/2012  . Atrial fibrillation ([place])   . Diabetes mellitus without complication ([place])   . Hypertension   . Hyperlipidemia   . Chest pain    Past Medical History:  Diagnosis Date  . Allergic rhinitis   . Atrial fibrillation ([place])   . Cataracts, bilateral   . Chest pain   . CKD (chronic kidney disease)   . Diabetes mellitus   . Endometrial cancer ([place])   . GERD (gastroesophageal reflux disease)   . Hyperlipidemia   . Hypertension   . Lymphedema   . Obesity   . Osteoarthritis     Family History  Problem Relation Age of Onset  . Lung cancer Sister   . Colon cancer Mother   . Kidney cancer Father   . Colon cancer Sister        x2    Past Surgical History:  Procedure Laterality Date  . APPENDECTOMY    . BACK SURGERY    . CARDIAC CATHETERIZATION  06/2009   REVEALED SMOOTH AND NORMAL CORONARY ARTERIES  . CHOLECYSTECTOMY    . VAGINAL HYSTERECTOMY     Social History   Occupational History  . Retired    Social History Main Topics  . Smoking status: Never Smoker  . Smokeless tobacco: Never Used  . Alcohol use No  . Drug use: No  . Sexual activity: Not on file

## 2016-08-23 ENCOUNTER — Ambulatory Visit
Admission: RE | Admit: 2016-08-23 | Discharge: 2016-08-23 | Disposition: A | Discharge status: Home / Self Care | Payer: Medicare Other | Source: Ambulatory Visit | Attending: Neurology | Admitting: Neurology

## 2016-08-23 DIAGNOSIS — G3184 Mild cognitive impairment, so stated: Secondary | ICD-10-CM

## 2016-09-02 ENCOUNTER — Telehealth: Payer: Self-pay

## 2016-09-02 NOTE — Telephone Encounter (Signed)
-----   Message from Cameron Sprang, MD sent at 09/01/2016  2:35 PM EDT ----- Pls let her know brain MRI did not show any evidence of tumor, stroke, or bleed. It showed age-related changes and arthritis changes in her neck. Thanks

## 2016-09-02 NOTE — Telephone Encounter (Signed)
Spoke with pt relaying message below.  Pt appreciative.  

## 2016-09-20 ENCOUNTER — Other Ambulatory Visit (INDEPENDENT_AMBULATORY_CARE_PROVIDER_SITE_OTHER): Payer: Self-pay

## 2016-09-20 ENCOUNTER — Ambulatory Visit (INDEPENDENT_AMBULATORY_CARE_PROVIDER_SITE_OTHER): Payer: Medicare Other | Admitting: Orthopedic Surgery

## 2016-09-20 DIAGNOSIS — M533 Sacrococcygeal disorders, not elsewhere classified: Secondary | ICD-10-CM

## 2016-09-20 DIAGNOSIS — M1712 Unilateral primary osteoarthritis, left knee: Secondary | ICD-10-CM

## 2016-09-20 NOTE — Progress Notes (Unsigned)
Patient called reporting increased pain. Per Dr Forbes Cellar last note I proceeded with order of pelvis eval for sacral fracture. Patient aware she will be contacted to schedule.

## 2016-09-22 ENCOUNTER — Other Ambulatory Visit: Payer: Medicare Other

## 2016-09-22 ENCOUNTER — Encounter (INDEPENDENT_AMBULATORY_CARE_PROVIDER_SITE_OTHER): Payer: Self-pay | Admitting: Orthopedic Surgery

## 2016-09-22 DIAGNOSIS — M1712 Unilateral primary osteoarthritis, left knee: Secondary | ICD-10-CM | POA: Diagnosis not present

## 2016-09-22 MED ORDER — HYLAN G-F 20 48 MG/6ML IX SOSY
48.0000 mg | PREFILLED_SYRINGE | INTRA_ARTICULAR | Status: AC | PRN
  Administered 2016-09-22: 48 mg via INTRA_ARTICULAR

## 2016-09-22 MED ORDER — LIDOCAINE HCL 1 % IJ SOLN
5.0000 mL | INTRAMUSCULAR | Status: AC | PRN
  Administered 2016-09-22: 5 mL

## 2016-09-22 NOTE — Progress Notes (Signed)
   Procedure Note  Patient: Janice [name]             Date of Birth: [birth year]           MRN: [id number]             Visit Date: 09/20/2016  Procedures: Visit Diagnoses: Unilateral primary osteoarthritis, left knee  Large Joint Inj Date/Time: 09/22/2016 8:28 AM Performed by: Meredith Pel Authorized by: Meredith Pel   Consent Given by:  Patient Site marked: the procedure site was marked   Timeout: prior to procedure the correct patient, procedure, and site was verified   Indications:  Pain, joint swelling and diagnostic evaluation Location:  Knee Site:  L knee Prep: patient was prepped and draped in usual sterile fashion   Needle Size:  18 G Needle Length:  1.5 inches Approach:  Superolateral Ultrasound Guidance: No   Fluoroscopic Guidance: No   Arthrogram: No   Medications:  5 mL lidocaine 1 %; 48 mg Hylan 48 MG/6ML Patient tolerance:  Patient tolerated the procedure well with no immediate complications   Plan for scan of right hip region as well to evaluate for sacral stress fracture

## 2016-09-23 ENCOUNTER — Ambulatory Visit
Admission: RE | Admit: 2016-09-23 | Discharge: 2016-09-23 | Disposition: A | Discharge status: Home / Self Care | Payer: Medicare Other | Source: Ambulatory Visit | Attending: Orthopedic Surgery | Admitting: Orthopedic Surgery

## 2016-09-23 DIAGNOSIS — M533 Sacrococcygeal disorders, not elsewhere classified: Secondary | ICD-10-CM

## 2016-09-27 NOTE — Progress Notes (Signed)
This is probably coming from her back need MRI of back

## 2016-09-28 NOTE — Progress Notes (Signed)
tyvm

## 2016-10-19 ENCOUNTER — Observation Stay (HOSPITAL_COMMUNITY)
Admission: EM | Admit: 2016-10-19 | Discharge: 2016-10-21 | Disposition: A | Discharge status: Home / Self Care | Payer: Medicare Other | Attending: Internal Medicine | Admitting: Internal Medicine

## 2016-10-19 ENCOUNTER — Emergency Department (HOSPITAL_COMMUNITY): Payer: Medicare Other

## 2016-10-19 ENCOUNTER — Encounter (HOSPITAL_COMMUNITY): Payer: Self-pay | Admitting: Internal Medicine

## 2016-10-19 DIAGNOSIS — I480 Paroxysmal atrial fibrillation: Secondary | ICD-10-CM | POA: Diagnosis not present

## 2016-10-19 DIAGNOSIS — I10 Essential (primary) hypertension: Secondary | ICD-10-CM | POA: Diagnosis present

## 2016-10-19 DIAGNOSIS — G3184 Mild cognitive impairment, so stated: Secondary | ICD-10-CM | POA: Diagnosis not present

## 2016-10-19 DIAGNOSIS — N183 Chronic kidney disease, stage 3 unspecified: Secondary | ICD-10-CM | POA: Diagnosis present

## 2016-10-19 DIAGNOSIS — E119 Type 2 diabetes mellitus without complications: Secondary | ICD-10-CM | POA: Diagnosis not present

## 2016-10-19 DIAGNOSIS — Z9049 Acquired absence of other specified parts of digestive tract: Secondary | ICD-10-CM | POA: Diagnosis not present

## 2016-10-19 DIAGNOSIS — Z8542 Personal history of malignant neoplasm of other parts of uterus: Secondary | ICD-10-CM | POA: Insufficient documentation

## 2016-10-19 DIAGNOSIS — Z79899 Other long term (current) drug therapy: Secondary | ICD-10-CM | POA: Diagnosis not present

## 2016-10-19 DIAGNOSIS — F0390 Unspecified dementia without behavioral disturbance: Secondary | ICD-10-CM | POA: Insufficient documentation

## 2016-10-19 DIAGNOSIS — I129 Hypertensive chronic kidney disease with stage 1 through stage 4 chronic kidney disease, or unspecified chronic kidney disease: Secondary | ICD-10-CM | POA: Diagnosis not present

## 2016-10-19 DIAGNOSIS — E669 Obesity, unspecified: Secondary | ICD-10-CM | POA: Insufficient documentation

## 2016-10-19 DIAGNOSIS — R079 Chest pain, unspecified: Principal | ICD-10-CM | POA: Insufficient documentation

## 2016-10-19 DIAGNOSIS — E1122 Type 2 diabetes mellitus with diabetic chronic kidney disease: Secondary | ICD-10-CM | POA: Insufficient documentation

## 2016-10-19 DIAGNOSIS — K219 Gastro-esophageal reflux disease without esophagitis: Secondary | ICD-10-CM | POA: Insufficient documentation

## 2016-10-19 DIAGNOSIS — Z66 Do not resuscitate: Secondary | ICD-10-CM | POA: Diagnosis not present

## 2016-10-19 DIAGNOSIS — E785 Hyperlipidemia, unspecified: Secondary | ICD-10-CM | POA: Insufficient documentation

## 2016-10-19 DIAGNOSIS — Z7901 Long term (current) use of anticoagulants: Secondary | ICD-10-CM

## 2016-10-19 DIAGNOSIS — Z7982 Long term (current) use of aspirin: Secondary | ICD-10-CM | POA: Insufficient documentation

## 2016-10-19 DIAGNOSIS — I4891 Unspecified atrial fibrillation: Secondary | ICD-10-CM | POA: Diagnosis present

## 2016-10-19 HISTORY — DX: Unspecified dementia, unspecified severity, without behavioral disturbance, psychotic disturbance, mood disturbance, and anxiety: F03.90

## 2016-10-19 LAB — BASIC METABOLIC PANEL
Anion gap: 9 (ref 5–15)
BUN: 25 mg/dL — AB (ref 6–20)
CHLORIDE: 104 mmol/L (ref 101–111)
CO2: 27 mmol/L (ref 22–32)
Calcium: 9.5 mg/dL (ref 8.9–10.3)
Creatinine, Ser: 1.33 mg/dL — ABNORMAL HIGH (ref 0.44–1.00)
GFR calc Af Amer: 42 mL/min — ABNORMAL LOW (ref >60)
GFR calc non Af Amer: 36 mL/min — ABNORMAL LOW (ref >60)
GLUCOSE: 117 mg/dL — AB (ref 65–99)
Potassium: 4.1 mmol/L (ref 3.5–5.1)
SODIUM: 140 mmol/L (ref 135–145)

## 2016-10-19 LAB — CBC
HCT: 43.4 % (ref 36.0–46.0)
Hemoglobin: 14.5 g/dL (ref 12.0–15.0)
MCH: 30.9 pg (ref 26.0–34.0)
MCHC: 33.4 g/dL (ref 30.0–36.0)
MCV: 92.5 fL (ref 78.0–100.0)
PLATELETS: 164 10*3/uL (ref 150–400)
RBC: 4.69 MIL/uL (ref 3.87–5.11)
RDW: 12.9 % (ref 11.5–15.5)
WBC: 11.2 10*3/uL — AB (ref 4.0–10.5)

## 2016-10-19 LAB — PROTIME-INR
INR: 2.76
PROTHROMBIN TIME: 29.7 s — AB (ref 11.4–15.2)

## 2016-10-19 LAB — POCT I-STAT TROPONIN I: Troponin i, poc: 0.02 ng/mL (ref 0.00–0.08)

## 2016-10-19 MED ORDER — MORPHINE SULFATE (PF) 10 MG/ML IV SOLN: 2.0000 mg | INTRAVENOUS | Status: DC | PRN

## 2016-10-19 MED ORDER — ACETAMINOPHEN 325 MG PO TABS: 650.0000 mg | ORAL_TABLET | ORAL | Status: DC | PRN

## 2016-10-19 MED ORDER — POLYETHYLENE GLYCOL 3350 17 G PO PACK
17.0000 g | PACK | Freq: Every day | ORAL | Status: DC
  Filled 2016-10-19: qty 1

## 2016-10-19 MED ORDER — DULOXETINE HCL 30 MG PO CPEP
30.0000 mg | ORAL_CAPSULE | Freq: Every day | ORAL | Status: DC
  Administered 2016-10-21: 30 mg via ORAL
  Filled 2016-10-19: qty 1

## 2016-10-19 MED ORDER — IRBESARTAN 300 MG PO TABS
300.0000 mg | ORAL_TABLET | Freq: Every day | ORAL | Status: DC
  Administered 2016-10-21: 300 mg via ORAL
  Filled 2016-10-19: qty 1

## 2016-10-19 MED ORDER — INSULIN ASPART 100 UNIT/ML ~~LOC~~ SOLN
0.0000 [IU] | Freq: Three times a day (TID) | SUBCUTANEOUS | Status: DC
  Administered 2016-10-20 – 2016-10-21 (×2): 2 [IU] via SUBCUTANEOUS

## 2016-10-19 MED ORDER — ONDANSETRON HCL 4 MG/2ML IJ SOLN: 4.0000 mg | Freq: Four times a day (QID) | INTRAMUSCULAR | Status: DC | PRN

## 2016-10-19 MED ORDER — WARFARIN - PHYSICIAN DOSING INPATIENT
Freq: Every day | Status: DC
  Administered 2016-10-20: 19:00:00

## 2016-10-19 MED ORDER — ENOXAPARIN SODIUM 40 MG/0.4ML ~~LOC~~ SOLN: 40.0000 mg | SUBCUTANEOUS | Status: DC

## 2016-10-19 MED ORDER — OXYCODONE HCL 5 MG PO TABS: 5.0000 mg | ORAL_TABLET | ORAL | Status: DC | PRN

## 2016-10-19 MED ORDER — METOPROLOL TARTRATE 50 MG PO TABS
50.0000 mg | ORAL_TABLET | Freq: Two times a day (BID) | ORAL | Status: DC
Start: 2016-10-19 — End: 2016-10-21
  Administered 2016-10-20 – 2016-10-21 (×3): 50 mg via ORAL
  Filled 2016-10-19 (×3): qty 1

## 2016-10-19 MED ORDER — SIMVASTATIN 20 MG PO TABS
20.0000 mg | ORAL_TABLET | Freq: Every day | ORAL | Status: DC
  Administered 2016-10-20: 20 mg via ORAL
  Filled 2016-10-19: qty 1

## 2016-10-19 MED ORDER — GI COCKTAIL ~~LOC~~: 30.0000 mL | Freq: Four times a day (QID) | ORAL | Status: DC | PRN

## 2016-10-19 MED ORDER — ASPIRIN EC 81 MG PO TBEC
81.0000 mg | DELAYED_RELEASE_TABLET | Freq: Every day | ORAL | Status: DC
  Administered 2016-10-21: 81 mg via ORAL
  Filled 2016-10-19: qty 1

## 2016-10-19 MED ORDER — HYDRALAZINE HCL 20 MG/ML IJ SOLN
5.0000 mg | INTRAMUSCULAR | Status: DC | PRN
  Filled 2016-10-19: qty 0.25

## 2016-10-19 MED ORDER — WARFARIN SODIUM 2.5 MG PO TABS
1.2500 mg | ORAL_TABLET | Freq: Every day | ORAL | Status: DC
  Administered 2016-10-20: 1.25 mg via ORAL
  Filled 2016-10-19: qty 0.5

## 2016-10-19 MED ORDER — DONEPEZIL HCL 10 MG PO TABS
10.0000 mg | ORAL_TABLET | Freq: Every day | ORAL | Status: DC
  Administered 2016-10-20 (×2): 10 mg via ORAL
  Filled 2016-10-19 (×2): qty 1

## 2016-10-19 NOTE — ED Notes (Signed)
Patient and family member each on cell phone, unable to triage.

## 2016-10-19 NOTE — ED Triage Notes (Signed)
Pt c/o central chest pressure, SOB, intermittent dizziness, nausea, palpitations x a few weeks, worse today. Also c/o right hip pain.

## 2016-10-19 NOTE — ED Notes (Signed)
Call report to [facility] at 11:15 pm

## 2016-10-19 NOTE — ED Provider Notes (Signed)
[facility] DEPT Provider Note   CSN: [id number] Arrival date & time: 10/19/16  1746     History   Chief Complaint Chief Complaint  Patient presents with  . Hip Pain  . Chest Pain    HPI Janice [name] is a 83 y.o. female.  HPI 83 year old Caucasian female past medical history significant for atrial fibrillation currently anticoagulated with Coumadin, CKD, diabetes, hypertension, hyperlipidemia, lymphedema presents to the ED today with complaints of chest pain. Patient states that her pain started approximately 2 days ago. She reports the pain is intermittent in intensity but constant. Describes the pain as a pressure that is substernal and radiates to the left side. Associated with mild shortness of breath. Chest pain is nonexertional. Nonpleuritic in nature. Denies any diaphoresis, nausea, emesis with the episode. Does report bilateral lower extremity edema which is baseline for patient. States that she hasn't taken her Lasix for the past 2-3 days. Patient also currently taking Coumadin for atrial fibrillation. Patient states that her last stress test was in 2011. Had no blockages at that time. Patient is followed cardiology with normal exam 3 months ago. Patient denies any recent hospitalizations/surgeries, prolonged immobilization, calf tenderness, unilateral leg swelling or hormone use.  Pt denies any fever, chill, ha, vision changes, lightheadedness, dizziness, congestion, neck pain, cp, sob, cough, abd pain, n/v/d, urinary symptoms, change in bowel habits, melena, hematochezia, lower extremity paresthesias.  Past Medical History:  Diagnosis Date  . Allergic rhinitis   . Atrial fibrillation ([place])   . Cataracts, bilateral   . Chest pain   . CKD (chronic kidney disease)   . Diabetes mellitus   . Endometrial cancer ([place])   . GERD (gastroesophageal reflux disease)   . Hyperlipidemia   . Hypertension   . Lymphedema   . Obesity   . Osteoarthritis     Patient Active  Problem List   Diagnosis Date Noted  . Mild cognitive impairment 07/27/2016  . Chronic pain of left knee 04/27/2016  . Chronic pain of right knee 04/27/2016  . Long term current use of anticoagulant therapy 09/27/2014  . CKD (chronic kidney disease) stage 3, GFR 30-59 ml/min 09/20/2014  . Diet-controlled diabetes mellitus ([place]) 09/20/2014  . Pelvic fracture ([place]) 09/17/2014  . Sacral fracture, closed ([place]) 09/17/2014  . Warfarin anticoagulation 09/17/2014  . Warfarin-induced coagulopathy ([place]) 06/23/2013  . Renal insufficiency 06/23/2013  . Chest pain 06/22/2013  . Other dysphagia 07/24/2012  . Atrial fibrillation ([place])   . Diabetes mellitus without complication ([place])   . Hypertension   . Hyperlipidemia   . Chest pain     Past Surgical History:  Procedure Laterality Date  . APPENDECTOMY    . BACK SURGERY    . CARDIAC CATHETERIZATION  06/2009   REVEALED SMOOTH AND NORMAL CORONARY ARTERIES  . CHOLECYSTECTOMY    . VAGINAL HYSTERECTOMY      OB History    No data available       Home Medications    Prior to Admission medications   Medication Sig Start Date End Date Taking? Authorizing Provider  aspirin 81 MG tablet Take 81 mg by mouth daily.     Yes [provider]  donepezil (ARICEPT) 10 MG tablet Take 1 tablet daily 07/23/16  Yes Cameron Sprang, MD  DULoxetine (CYMBALTA) 30 MG capsule Take 30 mg by mouth daily.   Yes [provider]  irbesartan (AVAPRO) 300 MG tablet Take 300 mg by mouth daily.  05/23/13  Yes [provider]  LUTEIN PO Take 1 tablet by mouth daily.   Yes [provider]  metoprolol (LOPRESSOR) 50 MG tablet Take 50 mg by mouth 2 (two) times daily.    Yes [provider]  nitroGLYCERIN (NITROSTAT) 0.4 MG SL tablet Place 0.4 mg under the tongue every 5 (five) minutes as needed for chest pain.    Yes [provider]  oxyCODONE (ROXICODONE) 5 MG immediate release tablet Take 1 tablet (5 mg total) by mouth  every 4 (four) hours as needed for severe pain. 09/17/14  Yes Jessy Oto, MD  polyethylene glycol (MIRALAX / GLYCOLAX) packet Take 17 g by mouth daily.   Yes [provider]  simvastatin (ZOCOR) 40 MG tablet Take 20 mg by mouth daily at 6 PM.   Yes [provider]  warfarin (COUMADIN) 2.5 MG tablet Take 1.25 mg by mouth daily.    Yes [provider]    Family History Family History  Problem Relation Age of Onset  . Lung cancer Sister   . Colon cancer Mother   . Kidney cancer Father   . Colon cancer Sister        x2    Social History Social History  Substance Use Topics  . Smoking status: Never Smoker  . Smokeless tobacco: Never Used  . Alcohol use No     Allergies   Demerol [meperidine] and Meperidine hcl   Review of Systems Review of Systems  Constitutional: Negative for chills and fever.  HENT: Negative for congestion.   Eyes: Negative for visual disturbance.  Respiratory: Positive for shortness of breath. Negative for cough and wheezing.   Cardiovascular: Positive for chest pain and leg swelling (bilatearlly). Negative for palpitations.  Gastrointestinal: Negative for abdominal pain, diarrhea, nausea and vomiting.  Genitourinary: Negative for dysuria, flank pain, frequency, hematuria, urgency, vaginal bleeding and vaginal discharge.  Musculoskeletal: Negative for arthralgias and myalgias.  Skin: Negative for rash.  Neurological: Negative for dizziness, syncope, weakness, light-headedness, numbness and headaches.  Psychiatric/Behavioral: Negative for sleep disturbance. The patient is not nervous/anxious.      Physical Exam Updated Vital Signs BP (!) 131/54   Pulse 85   Temp 97.6 F (36.4 C) (Oral)   Resp 18   Ht 5' 6.5" (1.689 m)   Wt 81.6 kg (180 lb)   SpO2 100%   BMI 28.62 kg/m   Physical Exam  Constitutional: She is oriented to person, place, and time. She appears well-developed and well-nourished.  Non-toxic appearance. No  distress.  HENT:  Head: Normocephalic and atraumatic.  Nose: Nose normal.  Mouth/Throat: Oropharynx is clear and moist.  Eyes: Pupils are equal, round, and reactive to light. Conjunctivae are normal. Right eye exhibits no discharge. Left eye exhibits no discharge.  Neck: Normal range of motion. Neck supple. No JVD present. No tracheal deviation present.  Cardiovascular: Normal rate, regular rhythm, normal heart sounds and intact distal pulses.  Exam reveals no gallop and no friction rub.   No murmur heard. Pulmonary/Chest: Effort normal and breath sounds normal. No respiratory distress. She has no wheezes. She has no rales. She exhibits no tenderness.  No hypoxia or tachypnea.  Abdominal: Soft. Bowel sounds are normal. She exhibits no distension. There is no tenderness. There is no rebound and no guarding.  Musculoskeletal: Normal range of motion.  Non pitting edema bilaterally without calf tenderness.  Lymphadenopathy:    She has no cervical adenopathy.  Neurological: She is alert and oriented to person, place, and time.  Skin:  Skin is warm and dry. Capillary refill takes less than 2 seconds. She is not diaphoretic.  Psychiatric: Her behavior is normal. Judgment and thought content normal.  Nursing note and vitals reviewed.    ED Treatments / Results  Labs (all labs ordered are listed, but only abnormal results are displayed) Labs Reviewed  BASIC METABOLIC PANEL - Abnormal; Notable for the following:       Result Value   Glucose, Bld 117 (*)    BUN 25 (*)    Creatinine, Ser 1.33 (*)    GFR calc non Af Amer 36 (*)    GFR calc Af Amer 42 (*)    All other components within normal limits  CBC - Abnormal; Notable for the following:    WBC 11.2 (*)    All other components within normal limits  PROTIME-INR - Abnormal; Notable for the following:    Prothrombin Time 29.7 (*)    All other components within normal limits  I-STAT TROPONIN, ED  POCT I-STAT TROPONIN I    EKG  EKG  Interpretation  Date/Time:  Tuesday October 19 2016 18:22:57 EDT Ventricular Rate:  82 PR Interval:    QRS Duration: 98 QT Interval:  364 QTC Calculation: 426 R Axis:   36 Text Interpretation:  Ectopic atrial rhythm Ventricular premature complex Abnormal R-wave progression, early transition T wave inversion in aVL more prominent than previous, otherwise similar to previous tracing Confirmed by Theotis Burrow [phone]) on 10/19/2016 9:53:36 PM       Radiology Dg Chest 2 View  Result Date: 10/19/2016 CLINICAL DATA:  Shortness of breath and chest pressure EXAM: CHEST  2 VIEW COMPARISON:  December 04, 2015 FINDINGS: There is no edema or consolidation. The heart size and pulmonary vascularity are normal. No adenopathy. No pneumothorax. There is degenerative change in the thoracic spine. There are surgical clips in the upper right abdomen. IMPRESSION: No edema or consolidation. Electronically Signed   By: Lowella Grip III M.D.   On: 10/19/2016 19:00    Procedures Procedures (including critical care time)  Medications Ordered in ED Medications - No data to display   Initial Impression / Assessment and Plan / ED Course  I have reviewed the triage vital signs and the nursing notes.  Pertinent labs & imaging results that were available during my care of the patient were reviewed by me and considered in my medical decision making (see chart for details).     Patient presents to the ED with complaints of substernal chest pain that radiates to the left side. She also reports intermittent shortness of breath. Patient's last stress test was in 2011. History of A. fib and currently anticoagulated with Coumadin. Patient states that her pain has subsided at this time. Denies any associated diaphoresis, nausea, emesis.  Vital signs are reassuring. Patient is not tachycardic or hypoxic. Her blood work is reassuring. Creatinine is at baseline. Mild leukocytosis of 11,000. INR is at therapeutic levels  for patient. Chest x-ray is unremarkable. EKG does show some new T-wave inversions in AVL.  No suspicion for PE at this time. Patient with history of hypertension, diabetes, hyperlipidemia significant comorbidities. Given patient's heart pathway score 6 feel that she would benefit from admission for trending troponins and stress test. Spoke with Dr. Lorin Mercy with hospital medicine who agrees for admission and will come to the ED to evaluate patient. Patient up-to-date on plan of care and [place] hemodynamically stable this time.  Pt dicussed with Dr. Rex Kras who also evaluated pt  who is agreeable to the above plan.    Final Clinical Impressions(s) / ED Diagnoses   Final diagnoses:  Chest pain, unspecified type    New Prescriptions New Prescriptions   No medications on file     Aaron Edelman 10/19/16 2241    Little, Wenda Overland, MD 10/23/16 2034

## 2016-10-19 NOTE — H&P (Addendum)
History and Physical    Janice [name] ALP:[id number] DOB: 24-Jul-1931 DOA: 10/19/2016  PCP: Harlan Stains, MD Consultants:  [facility] - cardiology Patient coming from: Home - lives alone; NOK: niece  Chief Complaint: chest pain  HPI: Janice [name] is a 83 y.o. female with medical history significant of afib on Coumadin; remote endometrial cancer with resultant LLE lymphedema; HTN; HLD; DM; CKD: and dementia presenting with chest pain.   She started feeling really bad and her niece came over and decided she needed to come in to be checked.  Chest pressure in mid-sternal region, a very uneasy feeling.  It has been present for the last couple of days, waxes and wanes.  Getting upset makes it worse, gets better when she calms down.  Some SOB.  No nauseated.  No diaphoresis.  Denies h/o similar.  Last stress test was many years ago, last cath was in 2011 and "revealed smooth and normal coronary arteries".  She does report worsening dementia.  She has gotten lost several times and reports that she will no longer drive her car.  She lives alone and is seriously considering transitioning into Assisted Living because she recognizes that she may not be safe to continue living alone.   ED Course: Negative troponin, EKG with new TWI in AVL, heart score 6, suggest admission  Review of Systems: As per HPI; otherwise review of systems reviewed and negative.   Ambulatory Status:   Ambulates with a cane  Past Medical History:  Diagnosis Date  . Allergic rhinitis   . Atrial fibrillation ([place])    on Coumadin  . Cataracts, bilateral   . Chest pain   . CKD (chronic kidney disease)   . Dementia   . Diabetes mellitus   . Endometrial cancer ([place]) 2010  . GERD (gastroesophageal reflux disease)   . Hyperlipidemia   . Hypertension   . Lymphedema    left leg  . Obesity   . Osteoarthritis     Past Surgical History:  Procedure Laterality Date  . APPENDECTOMY    . BACK SURGERY    . CARDIAC  CATHETERIZATION  06/2009   REVEALED SMOOTH AND NORMAL CORONARY ARTERIES  . CHOLECYSTECTOMY    . VAGINAL HYSTERECTOMY      Social History   Social History  . Marital status: Widowed    Spouse name: N/A  . Number of children: 0  . Years of education: N/A   Occupational History  . Retired    Social History Main Topics  . Smoking status: Never Smoker  . Smokeless tobacco: Never Used  . Alcohol use No  . Drug use: No  . Sexual activity: Not on file   Other Topics Concern  . Not on file   Social History Narrative  . No narrative on file    Allergies  Allergen Reactions  . Demerol [Meperidine] Nausea Only    And vertigo  . Meperidine Hcl     REACTION: nausea and vomiting    Family History  Problem Relation Age of Onset  . Lung cancer Sister   . Colon cancer Mother   . Kidney cancer Father   . Colon cancer Sister        x2    Prior to Admission medications   Medication Sig Start Date End Date Taking? Authorizing Provider  aspirin 81 MG tablet Take 81 mg by mouth daily.     Yes [provider]  donepezil (ARICEPT) 10 MG tablet Take 1 tablet daily  07/23/16  Yes Cameron Sprang, MD  DULoxetine (CYMBALTA) 30 MG capsule Take 30 mg by mouth daily.   Yes [provider]  irbesartan (AVAPRO) 300 MG tablet Take 300 mg by mouth daily.  05/23/13  Yes [provider]  LUTEIN PO Take 1 tablet by mouth daily.   Yes [provider]  metoprolol (LOPRESSOR) 50 MG tablet Take 50 mg by mouth 2 (two) times daily.    Yes [provider]  nitroGLYCERIN (NITROSTAT) 0.4 MG SL tablet Place 0.4 mg under the tongue every 5 (five) minutes as needed for chest pain.    Yes [provider]  oxyCODONE (ROXICODONE) 5 MG immediate release tablet Take 1 tablet (5 mg total) by mouth every 4 (four) hours as needed for severe pain. 09/17/14  Yes Jessy Oto, MD  polyethylene glycol (MIRALAX / GLYCOLAX) packet Take 17 g by mouth daily.   Yes [provider]  simvastatin (ZOCOR) 40 MG tablet Take 20 mg by mouth daily at 6 PM.   Yes [provider]  warfarin (COUMADIN) 2.5 MG tablet Take 1.25 mg by mouth daily.    Yes [provider]    Physical Exam: Vitals:   10/19/16 1758 10/19/16 2100 10/19/16 2130 10/19/16 2200  BP: 127/71 (!) 131/54 (!) 139/59 (!) 130/55  Pulse: 79 85 71 78  Resp: 18 18 14 13   Temp: 97.6 F (36.4 C)     TempSrc: Oral     SpO2: 100% 100% 100% 100%  Weight: 81.6 kg (180 lb)     Height: 5' 6.5" (1.689 m)        General:  Appears calm and comfortable and is NAD Eyes:  PERRL, EOMI, normal lids, iris ENT:  grossly normal hearing, lips & tongue, mmm; appropriate dentition Neck:  no LAD, masses or thyromegaly; no carotid bruits Cardiovascular:  RRR, no m/r/g.  Respiratory:   CTA bilaterally with no wheezes/rales/rhonchi.  Normal respiratory effort. Abdomen:  soft, NT, ND, NABS Skin:  no rash or induration seen on limited exam Musculoskeletal:  grossly normal tone BUE/BLE, good ROM, no bony abnormality Lower extremity:  L > R edema, 2-3+ and minimally pitting.  Limited foot exam with no ulcerations.  2+ distal pulses. Psychiatric:  grossly normal mood and affect, speech fluent and appropriate, AOx3.  Despite being oriented, she did have memory lapses and halting speech at times concerning for progressive dementia. Neurologic:  CN 2-12 grossly intact, moves all extremities in coordinated fashion, sensation intact  Labs on Admission: I have personally reviewed the available labs and imaging studies at the time of the admission.  Pertinent labs:  Glucose 117 BUN 25/Creatinine 1.33/GFR 36; prior 24/1.20/41 in 2/17 Troponin 0.02 WBC 11.2 INR 2.76   Radiological Exams on Admission: Dg Chest 2 View  Result Date: 10/19/2016 CLINICAL DATA:  Shortness of breath and chest pressure EXAM: CHEST  2 VIEW COMPARISON:  December 04, 2015 FINDINGS: There is no edema or consolidation. The heart size  and pulmonary vascularity are normal. No adenopathy. No pneumothorax. There is degenerative change in the thoracic spine. There are surgical clips in the upper right abdomen. IMPRESSION: No edema or consolidation. Electronically Signed   By: Lowella Grip III M.D.   On: 10/19/2016 19:00    EKG: Independently reviewed.  NSR vs. Ectopic atrial rhythm with rate 82; PVC, abnormal R wave progression, nonspecific ST changes with no evidence of acute ischemia   Assessment/Plan Principal Problem:   Chest pain Active Problems:  Atrial fibrillation ([place])   Diabetes mellitus without complication (HCC)   Hypertension   Hyperlipidemia   CKD (chronic kidney disease) stage 3, GFR 30-59 ml/min   Long term current use of anticoagulant therapy   Mild cognitive impairment   Chest pain -Patient with substernal chest pressure that has been waxing and waning for the last few days.  Non-exertional, relieved spontaneously. -1/3 typical symptoms suggestive of noncardiac chest pain.  -CXR unremarkable.   -Initial cardiac troponin negative.  -EKG not indicative of acute ischemia.   -GRACE score is 144; which predicts an in-hospital death rate of 3.3%.  -Will plan to place in observation status on telemetry to rule out ACS by overnight observation.  -cycle troponin q6h x 3 and repeat EKG in AM -Continue ASA 81 mg daily -morphine given -Risk factor stratification with HgbA1c and FLP; will also check TSH  -h/o clean cath in 2011, but does not appear to have had any stress testing since -Cardiology consultation in AM - NPO for possible stress test; cardsmaster message sent -Will plan to start Heparin drip if enzymes are positive and/or chest pain recurs  HTN -Takes Lopressor and Avapro at home -Patient with suboptimal control while in the ER -Will add prn hydralazine  HLD -Continue Zocor -Lipids were checked in 4/15 (TC 121, HDL 52, LDL 58, TG 54); will repeat   DM -Glucose 117 -Diet  controlled -Last A1c was 6.0 in 7/16 -There is no indication to start medication at this time, but will cover with SSI for now  CKD -BUN 25/Creatinine 1.33/GFR 36; prior 24/1.20/41 in 2/17 -Stable, will follow  Afib on Coumadin -INR 2.76 -Rate controlled on Lopressor -Continue current management  Dementia -Patient with apparent mild-moderate dementia during exam -Reports increasing difficulty with being independent, no longer safe to drive -She is interested in information about Assisted Living facilities -Will place SW consult  DVT prophylaxis: Coumadin  Code Status: DNR - confirmed with patient Family Communication: None present  Disposition Plan:  Home once clinically improved Consults called: Cardiology (via inbox message); SW  Admission status: It is my clinical opinion that referral for OBSERVATION is reasonable and necessary in this patient based on the above information provided. The aforementioned taken together are felt to place the patient at high risk for further clinical deterioration. However it is anticipated that the patient may be medically stable for discharge from the hospital within 24 to 48 hours.    Karmen Bongo MD [facility]  If note is complete, please contact covering daytime or nighttime physician. [email or web address] Password [email or web address]  10/19/2016, 11:25 PM

## 2016-10-20 ENCOUNTER — Other Ambulatory Visit: Payer: Self-pay

## 2016-10-20 ENCOUNTER — Ambulatory Visit (HOSPITAL_COMMUNITY)
Admit: 2016-10-20 | Discharge: 2016-10-20 | Disposition: A | Discharge status: Home / Self Care | Payer: Medicare Other | Source: Ambulatory Visit | Attending: Cardiology | Admitting: Cardiology

## 2016-10-20 DIAGNOSIS — R079 Chest pain, unspecified: Secondary | ICD-10-CM

## 2016-10-20 DIAGNOSIS — E1122 Type 2 diabetes mellitus with diabetic chronic kidney disease: Secondary | ICD-10-CM | POA: Diagnosis not present

## 2016-10-20 DIAGNOSIS — I129 Hypertensive chronic kidney disease with stage 1 through stage 4 chronic kidney disease, or unspecified chronic kidney disease: Secondary | ICD-10-CM | POA: Diagnosis not present

## 2016-10-20 DIAGNOSIS — I480 Paroxysmal atrial fibrillation: Secondary | ICD-10-CM | POA: Diagnosis not present

## 2016-10-20 LAB — GLUCOSE, CAPILLARY
GLUCOSE-CAPILLARY: 108 mg/dL — AB (ref 65–99)
GLUCOSE-CAPILLARY: 95 mg/dL (ref 65–99)
Glucose-Capillary: 118 mg/dL — ABNORMAL HIGH (ref 65–99)
Glucose-Capillary: 124 mg/dL — ABNORMAL HIGH (ref 65–99)

## 2016-10-20 LAB — LIPID PANEL
CHOL/HDL RATIO: 3 ratio
CHOLESTEROL: 118 mg/dL (ref 0–200)
HDL: 39 mg/dL — AB (ref >40)
LDL CALC: 61 mg/dL (ref 0–99)
TRIGLYCERIDES: 92 mg/dL (ref <150)
VLDL: 18 mg/dL (ref 0–40)

## 2016-10-20 LAB — TROPONIN I
Troponin I: <0.03 ng/mL (ref <0.03)
Troponin I: <0.03 ng/mL (ref <0.03)
Troponin I: <0.03 ng/mL (ref <0.03)

## 2016-10-20 LAB — TSH: TSH: 2.218 u[IU]/mL (ref 0.350–4.500)

## 2016-10-20 MED ORDER — REGADENOSON 0.4 MG/5ML IV SOLN
0.4000 mg | Freq: Once | INTRAVENOUS | Status: AC
  Administered 2016-10-20: 0.4 mg via INTRAVENOUS

## 2016-10-20 MED ORDER — TECHNETIUM TC 99M TETROFOSMIN IV KIT
10.0000 | PACK | Freq: Once | INTRAVENOUS | Status: AC | PRN
  Administered 2016-10-20: 10 via INTRAVENOUS

## 2016-10-20 MED ORDER — TECHNETIUM TC 99M TETROFOSMIN IV KIT
30.0000 | PACK | Freq: Once | INTRAVENOUS | Status: AC | PRN
  Administered 2016-10-20: 30 via INTRAVENOUS

## 2016-10-20 MED ORDER — REGADENOSON 0.4 MG/5ML IV SOLN
INTRAVENOUS | Status: AC
  Administered 2016-10-20: 0.4 mg via INTRAVENOUS
  Filled 2016-10-20: qty 5

## 2016-10-20 NOTE — Progress Notes (Signed)
Patient returned from [facility] nuclear med via [facility] transport, VS obtained, telemetry applied.

## 2016-10-20 NOTE — Progress Notes (Signed)
PHARMACIST - PHYSICIAN COMMUNICATION DR:  [place] Pink CONCERNING: Pharmacy Care Issues Regarding Warfarin Labs  RECOMMENDATION (Action Taken): A baseline and daily protime for three days has been ordered to meet the [facility] Patient safety goal and comply with the current [place].   The Pharmacy will defer all warfarin dose order changes and follow up of lab results to the prescriber unless an additional order to initiate a "pharmacy Coumadin consult" is placed.  DESCRIPTION:  While hospitalized, to be in compliance with [facility] Patient Safety Goals, all patients on warfarin must have a baseline and/or current protime prior to the administration of warfarin. Pharmacy has received your order for warfarin without these required laboratory assessments.   Gretta Arab PharmD, BCPS Pager [phone] 10/20/2016 9:18 AM

## 2016-10-20 NOTE — Clinical Social Work Note (Signed)
Clinical Social Work Assessment  Patient Details  Name: Janice [name] MRN: [id number] Date of Birth: [birth year]  Date of referral:  10/20/16               Reason for consult:  [employer]                Permission sought to share information with:    Permission granted to share information::     Name::        Agency::     Relationship::     Contact Information:     Housing/Transportation Living arrangements for the past 2 months:  Apartment Source of Information:  Patient Patient Interpreter Needed:  None Criminal Activity/Legal Involvement Pertinent to Current Situation/Hospitalization:  No - Comment as needed Significant Relationships:  Siblings, Other Family Members Lives with:  Self Do you feel safe going back to the place where you live?  Yes Need for family participation in patient care:  No (Coment)  Care giving concerns:  Patient reported no care giving concerns. Patient reported that she resides in an apartment alone and is independent with ADLs.    Social Worker assessment / plan:  CSW spoke with patient at bedside regarding consult for information about assisted living facilities. Patient reported that she had not put much thought into it yet and hasn't really had time to think about it. CSW explained Assisted Living Facility process and provided patient with a list of assisted living facilities in the county. CSW inquired if patient had any questions, patient reported none. Patient reported that she plans to return home at discharge and feels safe doing so. Patient reported that her sister and niece are her support system, noting her niece is like a daughter to her. Patient reported that she does not have a history of falls and that there no steps in the home. CSW inquired about patient's need for additional resources, patient reported none. CSW provided patient with CSW contact information, in case she has any questions regarding ALF process/resources.   CSW  provided patient with requested resources, signing off no other needs identified at this time.   Employment status:  Retired Nurse, adult PT Recommendations:  Not assessed at this time Information / Referral to community resources:  Other (Comment Required) (Assisted Living Facility Resources)  Patient/Family's Response to care:  Patient appreciative of CSW intervention and thanked CSW for speaking with her.   Patient/Family's Understanding of and Emotional Response to Diagnosis, Current Treatment, and Prognosis:  Patient presented pleasant throughout assessment. Patient reported that they were still running tests to see what was going on with her. Patient reported that she is feeling better and is ready to return home. Patient reported that it is time for her to start thinking about ALF moving forward, CSW positively affirmed patient's proactive thinking.  Emotional Assessment Appearance:  Appears stated age Attitude/Demeanor/Rapport:  Other (Open) Affect (typically observed):  Pleasant Orientation:  Oriented to Self, Oriented to Place, Oriented to  Time, Oriented to Situation Alcohol / Substance use:  Not Applicable Psych involvement (Current and /or in the community):  No (Comment)  Discharge Needs  Concerns to be addressed:  No discharge needs identified Readmission within the last 30 days:  No Current discharge risk:  None Barriers to Discharge:  No Barriers Identified   Burnis Medin, LCSW 10/20/2016, 11:21 AM

## 2016-10-20 NOTE — Progress Notes (Signed)
Report given to [facility], patient transferring to [name] Nuclear Med for stress test, patient and her niece aware and educated prior to transfer.

## 2016-10-20 NOTE — Progress Notes (Signed)
[facility] PROGRESS NOTE  Janice [name] XBM:[id number] DOB: [birth year] DOA: 10/19/2016  PCP: Harlan Stains, MD  Brief History/Interval Summary: 83 year old Caucasian female with a past medical history of atrial fibrillation on warfarin, history of remote endometrial cancer, hypertension, chronic kidney disease and dementia presented with chest pain. She was hospitalized for further management.  Reason for Visit: Chest pain  Consultants: Cardiology  Procedures: Nuclear stress test  Antibiotics: None  Subjective/Interval History: Patient is somewhat distracted. States that she is feeling much better compared to yesterday. Still has "uneasy" feeling in chest. Denies any shortness of breath.  ROS: Denies any nausea, vomiting  Objective:  Vital Signs  Vitals:   10/19/16 2200 10/19/16 2338 10/20/16 0500 10/20/16 1520  BP: (!) 130/55 (!) 182/69 139/64 (!) 131/56  Pulse: 78 84 74 72  Resp: 13 16 18 17   Temp:  98.1 F (36.7 C) (!) 97.4 F (36.3 C) 97.8 F (36.6 C)  TempSrc:  Oral Oral Oral  SpO2: 100% 99% 99% 100%  Weight:  81.5 kg (179 lb 10.8 oz)    Height:  5' 6.5" (1.689 m)     No intake or output data in the 24 hours ending 10/20/16 1819 Filed Weights   10/19/16 1758 10/19/16 2338  Weight: 81.6 kg (180 lb) 81.5 kg (179 lb 10.8 oz)    General appearance: alert, cooperative, appears stated age and no distress Resp: clear to auscultation bilaterally Cardio: regular rate and rhythm, S1, S2 normal, systolic murmur: early systolic 3/6, blowing at apex, no click and no rub GI: soft, non-tender; bowel sounds normal; no masses,  no organomegaly Extremities: Mild edema bilateral lower extremity Neurologic: No obvious focal neurological deficits are noted  Lab Results:  Data Reviewed: I have personally reviewed following labs and imaging studies  CBC:  Recent Labs Lab 10/19/16 2013  WBC 11.2*  HGB 14.5  HCT 43.4  MCV 92.5  PLT 027    Basic Metabolic  Panel:  Recent Labs Lab 10/19/16 2013  NA 140  K 4.1  CL 104  CO2 27  GLUCOSE 117*  BUN 25*  CREATININE 1.33*  CALCIUM 9.5    GFR: Estimated Creatinine Clearance: 35.5 mL/min (A) (by C-G formula based on SCr of 1.33 mg/dL (H)).  Coagulation Profile:  Recent Labs Lab 10/19/16 2013  INR 2.76    Cardiac Enzymes:  Recent Labs Lab 10/20/16 0022 10/20/16 0651 10/20/16 1528  TROPONINI <0.03 <0.03 <0.03    CBG:  Recent Labs Lab 10/20/16 0011 10/20/16 0822 10/20/16 1631  GLUCAP 108* 118* 124*    Lipid Profile:  Recent Labs  10/20/16 0651  CHOL 118  HDL 39*  LDLCALC 61  TRIG 92  CHOLHDL 3.0    Thyroid Function Tests:  Recent Labs  10/20/16 0022  TSH 2.218     Radiology Studies: Dg Chest 2 View  Result Date: 10/19/2016 CLINICAL DATA:  Shortness of breath and chest pressure EXAM: CHEST  2 VIEW COMPARISON:  December 04, 2015 FINDINGS: There is no edema or consolidation. The heart size and pulmonary vascularity are normal. No adenopathy. No pneumothorax. There is degenerative change in the thoracic spine. There are surgical clips in the upper right abdomen. IMPRESSION: No edema or consolidation. Electronically Signed   By: Lowella Grip III M.D.   On: 10/19/2016 19:00     Medications:  Scheduled: . aspirin EC  81 mg Oral Daily  . donepezil  10 mg Oral QHS  . DULoxetine  30 mg Oral Daily  .  insulin aspart  0-15 Units Subcutaneous TID WC  . irbesartan  300 mg Oral Daily  . metoprolol tartrate  50 mg Oral BID  . polyethylene glycol  17 g Oral Daily  . simvastatin  20 mg Oral q1800  . warfarin  1.25 mg Oral q1800  . Warfarin - Physician Dosing Inpatient   Does not apply q1800   Continuous:  KGY:[id number], gi cocktail, hydrALAZINE, morphine injection, ondansetron (ZOFRAN) IV, oxyCODONE  Assessment/Plan:  Principal Problem:   Chest pain Active Problems:   Atrial fibrillation (HCC)   Diabetes mellitus without complication (HCC)    Hypertension   Hyperlipidemia   CKD (chronic kidney disease) stage 3, GFR 30-59 ml/min   Long term current use of anticoagulant therapy   Mild cognitive impairment    Chest pain Patient does have risk factors to have coronary artery disease. She has ruled out for acute coronary syndrome. Cardiology has been consulted to assist with further management. Plan is for a stress test. She had a clean coronary catheterization in 2011.   History of essential hypertension. Stable. Continue medications.  Diabetes mellitus type II. Monitor CBGs.  Chronic kidney disease stage III Stable. Continue to monitor.  History of atrial fibrillation on warfarin. INR was therapeutic. Continue warfarin. Rate is controlled with Lopressor.  History of dementia. Seems to be at baseline. Still lives by herself but has been finding it difficult to do so recently. Has been looking into moving into an assisted living facility. PT evaluation  DVT Prophylaxis: On warfarin    Code Status: DNR  Family Communication: No family at bedside  Disposition Plan: Await stress test    LOS: 0 days   [facility] Pager [phone] 10/20/2016, 6:19 PM  If 7PM-7AM, please contact night-coverage at [email or web address], password [facility]

## 2016-10-20 NOTE — Progress Notes (Signed)
   Janice [name] presented for a nuclear stress test today.  No immediate complications.  Stress imaging is pending at this time.  Preliminary EKG findings may be listed in the chart, but the stress test result will not be finalized until perfusion imaging is complete.  Linus Mako, PA-C 10/20/2016, 5:11 PM

## 2016-10-20 NOTE — Consult Note (Signed)
Cardiology Consultation:   Patient ID: Janice [name]; [id number]; [birth year]   Admit date: 10/19/2016 Date of Consult: 10/20/2016  Primary Care Provider: Harlan Stains, MD Primary Cardiologist: Dr Acie Fredrickson   Patient Profile:   Janice [name] is a 83 y.o. female with a hx of PAF who is being seen today for the evaluation of chest pain at the request of Dr [place] Pink.  History of Present Illness:   Ms. Janice [name] is a pleasant 83 y/o female, with mild dementia who still lives alone in her own apartment. She has a niece and a sister who help out. She doesn't drive. She has been followed by Dr Acie Fredrickson with a history of PAF which has been stable for several years. She is on chronic Coumadin. She also has a history of chest pain in 2011. She had a normal Nuclear study then but continued to have chest pain. She has risk factors for CAD, HTN, and HLD, and underwent diagnostic cath in 2011 by Dr Acie Fredrickson which showed normal coronaries.   She was brought to the ED last PM by her niece with complaints of chest discomfort. This morning when I examined her she could not remember who brought her to the hospital or why. She did tell me she had an "uneasy" feeling in her chest but could elaborate. Her Troponin has been negative x 4, EKG no acute changes.   Past Medical History:  Diagnosis Date  . Allergic rhinitis   . Atrial fibrillation ([place])    on Coumadin  . Cataracts, bilateral   . Chest pain   . CKD (chronic kidney disease)   . Dementia   . Diabetes mellitus   . Endometrial cancer ([place]) 2010  . GERD (gastroesophageal reflux disease)   . Hyperlipidemia   . Hypertension   . Lymphedema    left leg  . Obesity   . Osteoarthritis     Past Surgical History:  Procedure Laterality Date  . APPENDECTOMY    . BACK SURGERY    . CARDIAC CATHETERIZATION  06/2009   REVEALED SMOOTH AND NORMAL CORONARY ARTERIES  . CHOLECYSTECTOMY    . VAGINAL HYSTERECTOMY       Inpatient Medications: Scheduled  Meds: . aspirin EC  81 mg Oral Daily  . donepezil  10 mg Oral QHS  . DULoxetine  30 mg Oral Daily  . insulin aspart  0-15 Units Subcutaneous TID WC  . irbesartan  300 mg Oral Daily  . metoprolol tartrate  50 mg Oral BID  . polyethylene glycol  17 g Oral Daily  . simvastatin  20 mg Oral q1800  . warfarin  1.25 mg Oral q1800  . Warfarin - Physician Dosing Inpatient   Does not apply q1800   Continuous Infusions:  PRN Meds: acetaminophen, gi cocktail, hydrALAZINE, morphine injection, ondansetron (ZOFRAN) IV, oxyCODONE  Allergies:    Allergies  Allergen Reactions  . Demerol [Meperidine] Nausea Only    And vertigo  . Meperidine Hcl     REACTION: nausea and vomiting    Social History:   Social History   Social History  . Marital status: Widowed    Spouse name: N/A  . Number of children: 0  . Years of education: N/A   Occupational History  . Retired    Social History Main Topics  . Smoking status: Never Smoker  . Smokeless tobacco: Never Used  . Alcohol use No  . Drug use: No  . Sexual activity: Not on file   Other Topics  Concern  . Not on file   Social History Narrative  . No narrative on file    Family History:   Family History  Problem Relation Age of Onset  . Lung cancer Sister   . Colon cancer Mother   . Kidney cancer Father   . Colon cancer Sister        x2     ROS:  Please see the history of present illness.  ROS  All other ROS reviewed and negative.     Physical Exam/Data:   Vitals:   10/19/16 2130 10/19/16 2200 10/19/16 2338 10/20/16 0500  BP: (!) 139/59 (!) 130/55 (!) 182/69 139/64  Pulse: 71 78 84 74  Resp: 14 13 16 18   Temp:   98.1 F (36.7 C) (!) 97.4 F (36.3 C)  TempSrc:   Oral Oral  SpO2: 100% 100% 99% 99%  Weight:   179 lb 10.8 oz (81.5 kg)   Height:   5' 6.5" (1.689 m)    No intake or output data in the 24 hours ending 10/20/16 0938 Filed Weights   10/19/16 1758 10/19/16 2338  Weight: 180 lb (81.6 kg) 179 lb 10.8 oz (81.5  kg)   Body mass index is 28.57 kg/m.  General:  Well nourished, well developed, in no acute distress HEENT: normal Lymph: no adenopathy Neck: no JVD Endocrine:  No thryomegaly Vascular: No carotid bruits; FA pulses 2+ bilaterally without bruits  Cardiac:  normal S1, S2; RRR; no murmur  Lungs:  clear to auscultation bilaterally, no wheezing, rhonchi or rales  Abd: soft, nontender, no hepatomegaly  Ext: no edema Musculoskeletal:  No deformities, BUE and BLE strength normal and equal Skin: warm and dry  Neuro:  CNs 2-12 intact, no focal abnormalities noted Psych:  Normal affect, a little confused about recent events.  EKG:  The EKG was personally reviewed and demonstrates:  NSR  Telemetry:  Telemetry was personally reviewed and demonstrates:  NSR  Laboratory Data:  Chemistry Recent Labs Lab 10/19/16 2013  NA 140  K 4.1  CL 104  CO2 27  GLUCOSE 117*  BUN 25*  CREATININE 1.33*  CALCIUM 9.5  GFRNONAA 36*  GFRAA 42*  ANIONGAP 9    No results for input(s): PROT, ALBUMIN, AST, ALT, ALKPHOS, BILITOT in the last 168 hours. Hematology Recent Labs Lab 10/19/16 2013  WBC 11.2*  RBC 4.69  HGB 14.5  HCT 43.4  MCV 92.5  MCH 30.9  MCHC 33.4  RDW 12.9  PLT 164   Cardiac Enzymes Recent Labs Lab 10/20/16 0022 10/20/16 0651  TROPONINI <0.03 <0.03    Recent Labs Lab 10/19/16 2014  TROPIPOC 0.02    BNPNo results for input(s): BNP, PROBNP in the last 168 hours.  DDimer No results for input(s): DDIMER in the last 168 hours.  Radiology/Studies:  Dg Chest 2 View  Result Date: 10/19/2016 CLINICAL DATA:  Shortness of breath and chest pressure EXAM: CHEST  2 VIEW COMPARISON:  December 04, 2015 FINDINGS: There is no edema or consolidation. The heart size and pulmonary vascularity are normal. No adenopathy. No pneumothorax. There is degenerative change in the thoracic spine. There are surgical clips in the upper right abdomen. IMPRESSION: No edema or consolidation.  Electronically Signed   By: Lowella Grip III M.D.   On: 10/19/2016 19:00    Assessment and Plan:   Chest pain- MI r/o. Normal coronaries in 2011, r/o progression of CAD  PAF- Pt has maintained NSR for several years  Chronic anticoagulation-  Coumadin Rx. INR 2.76. CHADS VASc= 4  HTN- Controlled  HLD- On statin Rx  CRI- Stage 3, GFR 36  Dementia- Mild, on Aricept and Cymbalta  Plan: Discussed with Dr Oval Linsey. Plan Myoview today, would only consider cath if the study was moderate to high risk.    Angelena Form, PA-C  10/20/2016 9:38 AM

## 2016-10-21 DIAGNOSIS — R079 Chest pain, unspecified: Secondary | ICD-10-CM | POA: Diagnosis not present

## 2016-10-21 DIAGNOSIS — I480 Paroxysmal atrial fibrillation: Secondary | ICD-10-CM | POA: Diagnosis not present

## 2016-10-21 LAB — CBC
HEMATOCRIT: 40.6 % (ref 36.0–46.0)
Hemoglobin: 13.3 g/dL (ref 12.0–15.0)
MCH: 30 pg (ref 26.0–34.0)
MCHC: 32.8 g/dL (ref 30.0–36.0)
MCV: 91.6 fL (ref 78.0–100.0)
Platelets: 140 10*3/uL — ABNORMAL LOW (ref 150–400)
RBC: 4.43 MIL/uL (ref 3.87–5.11)
RDW: 12.8 % (ref 11.5–15.5)
WBC: 7.9 10*3/uL (ref 4.0–10.5)

## 2016-10-21 LAB — BASIC METABOLIC PANEL
ANION GAP: 8 (ref 5–15)
BUN: 19 mg/dL (ref 6–20)
CALCIUM: 9 mg/dL (ref 8.9–10.3)
CO2: 26 mmol/L (ref 22–32)
CREATININE: 1.12 mg/dL — AB (ref 0.44–1.00)
Chloride: 106 mmol/L (ref 101–111)
GFR calc Af Amer: 52 mL/min — ABNORMAL LOW (ref >60)
GFR calc non Af Amer: 44 mL/min — ABNORMAL LOW (ref >60)
GLUCOSE: 115 mg/dL — AB (ref 65–99)
Potassium: 4.3 mmol/L (ref 3.5–5.1)
Sodium: 140 mmol/L (ref 135–145)

## 2016-10-21 LAB — PROTIME-INR
INR: 2.23
Prothrombin Time: 25.1 seconds — ABNORMAL HIGH (ref 11.4–15.2)

## 2016-10-21 LAB — GLUCOSE, CAPILLARY: GLUCOSE-CAPILLARY: 122 mg/dL — AB (ref 65–99)

## 2016-10-21 MED ORDER — OMEPRAZOLE 20 MG PO CPDR: 20.0000 mg | DELAYED_RELEASE_CAPSULE | Freq: Every day | ORAL | 0 refills | Status: DC

## 2016-10-21 NOTE — Discharge Instructions (Signed)

## 2016-10-21 NOTE — Progress Notes (Signed)
Progress Note  Patient Name: Janice [name] Date of Encounter: 10/21/2016  Primary Cardiologist: Dr Acie Fredrickson  Subjective   No chest pain  Inpatient Medications    Scheduled Meds: . aspirin EC  81 mg Oral Daily  . donepezil  10 mg Oral QHS  . DULoxetine  30 mg Oral Daily  . insulin aspart  0-15 Units Subcutaneous TID WC  . irbesartan  300 mg Oral Daily  . metoprolol tartrate  50 mg Oral BID  . polyethylene glycol  17 g Oral Daily  . simvastatin  20 mg Oral q1800  . warfarin  1.25 mg Oral q1800  . Warfarin - Physician Dosing Inpatient   Does not apply q1800   Continuous Infusions:  PRN Meds: acetaminophen, gi cocktail, hydrALAZINE, morphine injection, ondansetron (ZOFRAN) IV, oxyCODONE   Vital Signs    Vitals:   10/20/16 0500 10/20/16 1520 10/20/16 2020 10/21/16 0653  BP: 139/64 (!) 131/56 (!) 154/66 130/80  Pulse: 74 72 85 78  Resp: 18 17 16 16   Temp: (!) 97.4 F (36.3 C) 97.8 F (36.6 C) 97.6 F (36.4 C) 97.6 F (36.4 C)  TempSrc: Oral Oral Oral Oral  SpO2: 99% 100% 99%   Weight:      Height:       No intake or output data in the 24 hours ending 10/21/16 0807 Filed Weights   10/19/16 1758 10/19/16 2338  Weight: 180 lb (81.6 kg) 179 lb 10.8 oz (81.5 kg)    Telemetry    Not on telemetry - Personally Reviewed  ECG     NSR 10/20/16- Personally Reviewed  Physical Exam   GEN: No acute distress.   Neck: No JVD Cardiac: RRR, no murmurs, rubs, or gallops.  Respiratory: Clear to auscultation bilaterally. GI: Soft, nontender, non-distended  MS: No edema; No deformity. Neuro:  Nonfocal  Psych: Normal affect   Labs    Chemistry Recent Labs Lab 10/19/16 2013 10/21/16 0402  NA 140 140  K 4.1 4.3  CL 104 106  CO2 27 26  GLUCOSE 117* 115*  BUN 25* 19  CREATININE 1.33* 1.12*  CALCIUM 9.5 9.0  GFRNONAA 36* 44*  GFRAA 42* 52*  ANIONGAP 9 8     Hematology Recent Labs Lab 10/19/16 2013 10/21/16 0402  WBC 11.2* 7.9  RBC 4.69 4.43  HGB 14.5  13.3  HCT 43.4 40.6  MCV 92.5 91.6  MCH 30.9 30.0  MCHC 33.4 32.8  RDW 12.9 12.8  PLT 164 140*    Cardiac Enzymes Recent Labs Lab 10/20/16 0022 10/20/16 0651 10/20/16 1528  TROPONINI <0.03 <0.03 <0.03    Recent Labs Lab 10/19/16 2014  TROPIPOC 0.02     BNPNo results for input(s): BNP, PROBNP in the last 168 hours.   DDimer No results for input(s): DDIMER in the last 168 hours.   Radiology    Dg Chest 2 View  Result Date: 10/19/2016 CLINICAL DATA:  Shortness of breath and chest pressure EXAM: CHEST  2 VIEW COMPARISON:  December 04, 2015 FINDINGS: There is no edema or consolidation. The heart size and pulmonary vascularity are normal. No adenopathy. No pneumothorax. There is degenerative change in the thoracic spine. There are surgical clips in the upper right abdomen. IMPRESSION: No edema or consolidation. Electronically Signed   By: Lowella Grip III M.D.   On: 10/19/2016 19:00   Nm Myocar Multi W/spect W/wall Motion / Ef  Result Date: 10/20/2016 CLINICAL DATA:  Chest pain EXAM: MYOCARDIAL IMAGING WITH SPECT (REST  AND PHARMACOLOGIC-STRESS) GATED LEFT VENTRICULAR WALL MOTION STUDY LEFT VENTRICULAR EJECTION FRACTION TECHNIQUE: Standard myocardial SPECT imaging was performed after resting intravenous injection of 10 mCi Tc-47m tetrofosmin. Subsequently, intravenous infusion of Lexiscan was performed under the supervision of the Cardiology staff. At peak effect of the drug, 30 mCi Tc-53m tetrofosmin was injected intravenously and standard myocardial SPECT imaging was performed. Quantitative gated imaging was also performed to evaluate left ventricular wall motion, and estimate left ventricular ejection fraction. COMPARISON:  None. FINDINGS: Perfusion: There is a fixed defect involving the septum which is moderate to large. There is attenuation artifact involving the inferior and lateral walls. No stress-induced ischemia. Wall Motion: Normal left ventricular wall motion. No left  ventricular dilation. Left Ventricular Ejection Fraction: 74 % End diastolic volume 54 ml End systolic volume 14 ml IMPRESSION: 1. There is a fixed defect involving the septum. There is also attenuation artifact involving the inferior and lateral walls. No stress-induced ischemia. 2. Normal left ventricular wall motion. 3. Left ventricular ejection fraction 74% 4. Non invasive risk stratification*: Low risk. *2012 Appropriate Use Criteria for Coronary Revascularization Focused Update: J Am Coll Cardiol. 1829;93(7):169-678. http://content.airportbarriers.com.aspx?articleid=[email or web address] Electronically Signed   By: Marybelle Killings M.D.   On: 10/20/2016 19:58    Cardiac Studies   See Myoview results-10/20/16  Patient Profile     83 y.o. female with a hx of PAF and mild dementia who was seen 10/20/16 for the evaluation of chest pain. H/O normal cors in 2011  Assessment & Plan    Chest pain- Myoview low risk 10/20/16- Normal coronaries 2011  PAF- Pt has maintained NSR for several years  Chronic anticoagulation- Coumadin Rx. INR 2.23. CHADS VASc= 4  HTN- Controlled  HLD- On statin Rx  CRI- Stage 3, GFR 36  Dementia- Mild, on Aricept and Cymbalta  Plan: OK for discharge. I will arrange f/u with Dr Acie Fredrickson  Signed, Kerin Ransom, PA-C  10/21/2016, 8:07 AM

## 2016-10-21 NOTE — Evaluation (Signed)
Physical Therapy Evaluation-1x Patient Details Name: Janice [name] MRN: [id number] DOB: [birth year] Today's Date: 10/21/2016   History of Present Illness  83 yo female admitted with chest pain. Hx of A fib, CKD, DM, L LE lymphedema, obesity, fall.   Clinical Impression  On eval, pt was Mod Ind with mobility. She walked ~200 feet with a RW. No LOB with RW use. Pt tolerated distance well. No complaints voiced during session. Pt could possibly benefit from intermittent supervision (someone checking on pt occasionally) due to dementia. 1x eval. Will sign off.     Follow Up Recommendations No PT follow up;Supervision - Intermittent    Equipment Recommendations  None recommended by PT    Recommendations for Other Services       Precautions / Restrictions Precautions Precautions: None Restrictions Weight Bearing Restrictions: No      Mobility  Bed Mobility               General bed mobility comments: pt oob walking in room  Transfers Overall transfer level: Modified independent               General transfer comment: pt oob walking in room. no assist needed to sit in chair without arms.  Ambulation/Gait Ambulation/Gait assistance: Modified independent (Device/Increase time) Ambulation Distance (Feet): 200 Feet Assistive device: Rolling walker (2 wheeled) Gait Pattern/deviations: Step-through pattern;Trunk flexed        Stairs            Wheelchair Mobility    Modified Rankin (Stroke Patients Only)       Balance Overall balance assessment: Needs assistance;History of Falls           Standing balance-Leahy Scale: Fair Standing balance comment: pt uses RW for ambulation. She was able to stand statically without support while therapist adjusted walker height                             Pertinent Vitals/Pain Pain Assessment: No/denies pain    Home Living Family/patient expects to be discharged to:: Private residence Living  Arrangements: Alone     Home Access: Level entry     Home Layout: One [place]: Environmental consultant - 2 wheels;Grab bars - tub/shower      Prior Function Level of Independence: Independent with assistive device(s)         Comments: pt stated she uses a walker     Hand Dominance        Extremity/Trunk Assessment   Upper Extremity Assessment Upper Extremity Assessment: Overall WFL for tasks assessed    Lower Extremity Assessment Lower Extremity Assessment: Generalized weakness    Cervical / Trunk Assessment Cervical / Trunk Assessment: Kyphotic  Communication   Communication: No difficulties  Cognition Arousal/Alertness: Awake/alert Behavior During Therapy: WFL for tasks assessed/performed Overall Cognitive Status: History of cognitive impairments - at baseline                                 General Comments: pt has dementia      General Comments      Exercises     Assessment/Plan    PT Assessment Patent does not need any further PT services  PT Problem List         PT Treatment Interventions      PT Goals (Current goals can be found in the Care Plan section)  Acute  Rehab PT Goals Patient Stated Goal: home PT Goal Formulation: All assessment and education complete, DC therapy    Frequency     Barriers to discharge        Co-evaluation               AM-PAC PT "6 Clicks" Daily Activity  Outcome Measure Difficulty turning over in bed (including adjusting bedclothes, sheets and blankets)?: None Difficulty moving from lying on back to sitting on the side of the bed? : None Difficulty sitting down on and standing up from a chair with arms (e.g., wheelchair, bedside commode, etc,.)?: None Help needed moving to and from a bed to chair (including a wheelchair)?: None Help needed walking in hospital room?: None Help needed climbing 3-5 steps with a railing? : A Little 6 Click Score: 23    End of Session   Activity Tolerance:  Patient tolerated treatment well Patient left: in chair;with call bell/phone within reach        Time: 0916-0924 PT Time Calculation (min) (ACUTE ONLY): 8 min   Charges:   PT Evaluation $PT Eval Low Complexity: 1 Low     PT G Codes:   PT G-Codes **NOT FOR INPATIENT CLASS** Functional Assessment Tool Used: AM-PAC 6 Clicks Basic Mobility;Clinical judgement Functional Limitation: Mobility: Walking and moving around Mobility: Walking and Moving Around Current Status (Q2595): 0 percent impaired, limited or restricted Mobility: Walking and Moving Around Goal Status (G3875): 0 percent impaired, limited or restricted Mobility: Walking and Moving Around Discharge Status (I4332): 0 percent impaired, limited or restricted      Weston Anna, MPT Pager: [phone]

## 2016-10-21 NOTE — Discharge Summary (Signed)
[facility]  Physician Discharge Summary   Patient ID: Janice [name] MRN: [id number] DOB/AGE: January 31, 1932 83 y.o.  Admit date: 10/19/2016 Discharge date: 10/21/2016  PCP: Harlan Stains, MD  DISCHARGE DIAGNOSES:  Principal Problem:   Chest pain Active Problems:   Atrial fibrillation ([place])   Diabetes mellitus without complication (HCC)   Hypertension   Hyperlipidemia   CKD (chronic kidney disease) stage 3, GFR 30-59 ml/min   Long term current use of anticoagulant therapy   Mild cognitive impairment   RECOMMENDATIONS FOR OUTPATIENT FOLLOW UP: 1. Cardiology to arrange outpatient follow-up  DISCHARGE CONDITION: fair  Diet recommendation: As before  Filed Weights   10/19/16 1758 10/19/16 2338  Weight: 81.6 kg (180 lb) 81.5 kg (179 lb 10.8 oz)    INITIAL HISTORY: 83 year old Caucasian female with a past medical history of atrial fibrillation on warfarin, history of remote endometrial cancer, hypertension, chronic kidney disease and dementia presented with chest pain. She was hospitalized for further management.  Consultations:  Cardiology  Procedures: Nuclear stress test IMPRESSION: 1. There is a fixed defect involving the septum. There is also attenuation artifact involving the inferior and lateral walls. No stress-induced ischemia.  2. Normal left ventricular wall motion.  3. Left ventricular ejection fraction 74%  4. Non invasive risk stratification*: Low risk.  HOSPITAL COURSE:   Chest pain Patient does have risk factors to have coronary artery disease. She ruled out for acute coronary syndrome. Cardiology was consulted. Patient underwent stress test, which was low risk. EF is 74%. She has previously had a cardiac catheterization which did not reveal any significant coronary artery disease. No further workup is planned. Initiated PPI.   History of essential hypertension. Continue home medication  Diabetes mellitus type II. Continue home  medication  Chronic kidney disease stage III Stable.  History of atrial fibrillation on warfarin. Stable. Continue Lopressor and warfarin. INR was therapeutic.  History of dementia. Seems to be at baseline. Still lives by herself but has been finding it difficult to do so recently. Has been looking into moving into an assisted living facility. Seen by physical therapy. Patient will return back home. Her niece will provide supervision and help as needed.  Overall, stable. Okay for discharge home today.   PERTINENT LABS:  The results of significant diagnostics from this hospitalization (including imaging, microbiology, ancillary and laboratory) are listed below for reference.      Labs: Basic Metabolic Panel:  Recent Labs Lab 10/19/16 2013 10/21/16 0402  NA 140 140  K 4.1 4.3  CL 104 106  CO2 27 26  GLUCOSE 117* 115*  BUN 25* 19  CREATININE 1.33* 1.12*  CALCIUM 9.5 9.0   CBC:  Recent Labs Lab 10/19/16 2013 10/21/16 0402  WBC 11.2* 7.9  HGB 14.5 13.3  HCT 43.4 40.6  MCV 92.5 91.6  PLT 164 140*   Cardiac Enzymes:  Recent Labs Lab 10/20/16 0022 10/20/16 0651 10/20/16 1528  TROPONINI <0.03 <0.03 <0.03    CBG:  Recent Labs Lab 10/20/16 0011 10/20/16 0822 10/20/16 1631 10/20/16 2103 10/21/16 0721  GLUCAP 108* 118* 124* 95 122*     IMAGING STUDIES Dg Chest 2 View  Result Date: 10/19/2016 CLINICAL DATA:  Shortness of breath and chest pressure EXAM: CHEST  2 VIEW COMPARISON:  December 04, 2015 FINDINGS: There is no edema or consolidation. The heart size and pulmonary vascularity are normal. No adenopathy. No pneumothorax. There is degenerative change in the thoracic spine. There are surgical clips in the upper right abdomen.  IMPRESSION: No edema or consolidation. Electronically Signed   By: Lowella Grip III M.D.   On: 10/19/2016 19:00   Nm Myocar Multi W/spect W/wall Motion / Ef  Result Date: 10/20/2016 CLINICAL DATA:  Chest pain EXAM:  MYOCARDIAL IMAGING WITH SPECT (REST AND PHARMACOLOGIC-STRESS) GATED LEFT VENTRICULAR WALL MOTION STUDY LEFT VENTRICULAR EJECTION FRACTION TECHNIQUE: Standard myocardial SPECT imaging was performed after resting intravenous injection of 10 mCi Tc-31m tetrofosmin. Subsequently, intravenous infusion of Lexiscan was performed under the supervision of the Cardiology staff. At peak effect of the drug, 30 mCi Tc-76m tetrofosmin was injected intravenously and standard myocardial SPECT imaging was performed. Quantitative gated imaging was also performed to evaluate left ventricular wall motion, and estimate left ventricular ejection fraction. COMPARISON:  None. FINDINGS: Perfusion: There is a fixed defect involving the septum which is moderate to large. There is attenuation artifact involving the inferior and lateral walls. No stress-induced ischemia. Wall Motion: Normal left ventricular wall motion. No left ventricular dilation. Left Ventricular Ejection Fraction: 74 % End diastolic volume 54 ml End systolic volume 14 ml IMPRESSION: 1. There is a fixed defect involving the septum. There is also attenuation artifact involving the inferior and lateral walls. No stress-induced ischemia. 2. Normal left ventricular wall motion. 3. Left ventricular ejection fraction 74% 4. Non invasive risk stratification*: Low risk. *2012 Appropriate Use Criteria for Coronary Revascularization Focused Update: J Am Coll Cardiol. 7096;28(3):662-947. http://content.airportbarriers.com.aspx?articleid=[email or web address] Electronically Signed   By: Marybelle Killings M.D.   On: 10/20/2016 19:58    DISCHARGE EXAMINATION: Vitals:   10/20/16 0500 10/20/16 1520 10/20/16 2020 10/21/16 0653  BP: 139/64 (!) 131/56 (!) 154/66 130/80  Pulse: 74 72 85 78  Resp: 18 17 16 16   Temp: (!) 97.4 F (36.3 C) 97.8 F (36.6 C) 97.6 F (36.4 C) 97.6 F (36.4 C)  TempSrc: Oral Oral Oral Oral  SpO2: 99% 100% 99%   Weight:      Height:       General appearance: alert,  cooperative, appears stated age and no distress Resp: clear to auscultation bilaterally Cardio: regular rate and rhythm, S1, S2 normal, no murmur, click, rub or gallop  DISPOSITION: Home  Discharge Instructions    Call MD for:  difficulty breathing, headache or visual disturbances    Complete by:  As directed    Call MD for:  extreme fatigue    Complete by:  As directed    Call MD for:  persistant dizziness or light-headedness    Complete by:  As directed    Call MD for:  persistant nausea and vomiting    Complete by:  As directed    Call MD for:  severe uncontrolled pain    Complete by:  As directed    Call MD for:  temperature >100.4    Complete by:  As directed    Diet - low sodium heart healthy    Complete by:  As directed    Discharge instructions    Complete by:  As directed    Please be sure to follow up with your PCP in 1 week. Take your medications as prescribed.  You were cared for by a hospitalist during your hospital stay. If you have any questions about your discharge medications or the care you received while you were in the hospital after you are discharged, you can call the unit and asked to speak with the hospitalist on call if the hospitalist that took care of you is not available. Once you are discharged, your  primary care physician will handle any further medical issues. Please note that NO REFILLS for any discharge medications will be authorized once you are discharged, as it is imperative that you return to your primary care physician (or establish a relationship with a primary care physician if you do not have one) for your aftercare needs so that they can reassess your need for medications and monitor your lab values. If you do not have a primary care physician, you can call [phone] for a physician referral.   Increase activity slowly    Complete by:  As directed       ALLERGIES:  Allergies  Allergen Reactions  . Demerol [Meperidine] Nausea Only    And  vertigo  . Meperidine Hcl     REACTION: nausea and vomiting    Discharge Medication List as of 10/21/2016  9:42 AM    START taking these medications   Details  omeprazole (PRILOSEC) 20 MG capsule Take 1 capsule (20 mg total) by mouth daily., Starting Thu 10/21/2016, Normal      CONTINUE these medications which have NOT CHANGED   Details  aspirin 81 MG tablet Take 81 mg by mouth daily.  , Until Discontinued, Historical Med    donepezil (ARICEPT) 10 MG tablet Take 1 tablet daily, Normal    DULoxetine (CYMBALTA) 30 MG capsule Take 30 mg by mouth daily., Historical Med    irbesartan (AVAPRO) 300 MG tablet Take 300 mg by mouth daily. , Starting 05/23/2013, Until Discontinued, Historical Med    LUTEIN PO Take 1 tablet by mouth daily., Until Discontinued, Historical Med    metoprolol (LOPRESSOR) 50 MG tablet Take 50 mg by mouth 2 (two) times daily. , Until Discontinued, Historical Med    nitroGLYCERIN (NITROSTAT) 0.4 MG SL tablet Place 0.4 mg under the tongue every 5 (five) minutes as needed for chest pain. , Until Discontinued, Historical Med    oxyCODONE (ROXICODONE) 5 MG immediate release tablet Take 1 tablet (5 mg total) by mouth every 4 (four) hours as needed for severe pain., Starting Tue 09/17/2014, Print    polyethylene glycol (MIRALAX / GLYCOLAX) packet Take 17 g by mouth daily., Historical Med    simvastatin (ZOCOR) 40 MG tablet Take 20 mg by mouth daily at 6 PM., Until Discontinued, Historical Med    warfarin (COUMADIN) 2.5 MG tablet Take 1.25 mg by mouth daily. , Historical Med         Follow-up Information    Nahser, Wonda Cheng, MD Follow up.   Specialty:  Cardiology Why:  office will contact you Contact information: [address] [place] [address] [phone]        Harlan Stains, MD. Schedule an appointment as soon as possible for a visit in 1 week(s).   Specialty:  Family Medicine Contact information: [place] [address] [phone]           TOTAL DISCHARGE TIME: 45 mins  [facility] Pager [phone]  10/21/2016, 3:19 PM

## 2016-11-09 ENCOUNTER — Encounter: Payer: Self-pay | Admitting: Physician Assistant

## 2016-11-09 ENCOUNTER — Encounter (INDEPENDENT_AMBULATORY_CARE_PROVIDER_SITE_OTHER): Payer: Self-pay

## 2016-11-09 ENCOUNTER — Ambulatory Visit (INDEPENDENT_AMBULATORY_CARE_PROVIDER_SITE_OTHER): Payer: Medicare Other | Admitting: Physician Assistant

## 2016-11-09 VITALS — BP 116/70 | HR 65 | Ht 66.5 in | Wt 177.0 lb

## 2016-11-09 DIAGNOSIS — I10 Essential (primary) hypertension: Secondary | ICD-10-CM | POA: Diagnosis not present

## 2016-11-09 DIAGNOSIS — N183 Chronic kidney disease, stage 3 unspecified: Secondary | ICD-10-CM

## 2016-11-09 DIAGNOSIS — I480 Paroxysmal atrial fibrillation: Secondary | ICD-10-CM | POA: Diagnosis not present

## 2016-11-09 DIAGNOSIS — R079 Chest pain, unspecified: Secondary | ICD-10-CM

## 2016-11-09 NOTE — Progress Notes (Signed)
Cardiology Office Note    Date:  11/09/2016  ID:  Janice [name], Janice [name] 05/10/1931, MRN [id number] PCP:  Harlan Stains, MD  Cardiologist:  Dr. Acie Fredrickson   Chief Complaint: f/u chest pain  History of Present Illness:  Janice [name] is a 83 y.o. female with history of PAF on chronic anticoagulation, mild dementia, normal coronaries in 2011, CKD stage III, cataracts, DM, HTN, HLD, lymphedema of left leg, GERD, endometrial cancer, obesity, osteoarthritis who presents for post-hospital follow-up. She was admitted 10/2016 with complains of chest discomfort/uneasy feeling. She had a difficult time elaborating on this due to memory difficulties. Troponins were negative and ECG nonacute. CXR NAD. Nuclear stress test 10/20/16 showed fixed defect involving the septum, attenuation artifact of inferior and lateral walls, no ischemia, normal wall motion, EF 74%, low risk. Labs otherwise showed Cr 1.12, K 4.3, Hgb 13.3, LDL 61. PPI was started.   She returns for follow-up with her niece who is her caregiver. She denies any interim issues since discharge. No CP, SOB, dizziness, vomiting, or bleeding. She has since seen her PCP who switched her from Coumadin to Eliquis. She's not had any issues on this medication. She walks with a cane without any recent falls.   Past Medical History:  Diagnosis Date  . Allergic rhinitis   . Cataracts, bilateral   . Chest pain   . CKD (chronic kidney disease), stage III   . Dementia   . Diabetes mellitus   . Endometrial cancer ([place]) 2010  . GERD (gastroesophageal reflux disease)   . Hyperlipidemia   . Hypertension   . Lymphedema    left leg  . Normal coronary arteries 2011  . Obesity   . Osteoarthritis   . PAF (paroxysmal atrial fibrillation) (HCC)    on Coumadin    Past Surgical History:  Procedure Laterality Date  . APPENDECTOMY    . BACK SURGERY    . CARDIAC CATHETERIZATION  06/2009   REVEALED SMOOTH AND NORMAL CORONARY ARTERIES  . CHOLECYSTECTOMY    .  VAGINAL HYSTERECTOMY      Current Medications: Current Meds  Medication Sig  . aspirin 81 MG tablet Take 81 mg by mouth daily.    . DULoxetine (CYMBALTA) 30 MG capsule Take 30 mg by mouth daily.  Marland Kitchen ELIQUIS 5 MG TABS tablet Take 5 mg by mouth 2 (two) times daily.  . furosemide (LASIX) 20 MG tablet Take 20 mg by mouth 3 times/day as needed-between meals & bedtime.  . irbesartan (AVAPRO) 300 MG tablet Take 300 mg by mouth daily.   . LUTEIN PO Take 1 tablet by mouth daily.  . metoprolol (LOPRESSOR) 50 MG tablet Take 50 mg by mouth 2 (two) times daily.   . nitroGLYCERIN (NITROSTAT) 0.4 MG SL tablet Place 0.4 mg under the tongue every 5 (five) minutes as needed for chest pain.   Marland Kitchen omeprazole (PRILOSEC) 20 MG capsule Take 1 capsule (20 mg total) by mouth daily.  Marland Kitchen oxyCODONE (ROXICODONE) 5 MG immediate release tablet Take 1 tablet (5 mg total) by mouth every 4 (four) hours as needed for severe pain.  . polyethylene glycol (MIRALAX / GLYCOLAX) packet Take 17 g by mouth daily.  . simvastatin (ZOCOR) 40 MG tablet Take 20 mg by mouth daily at 6 PM.     Allergies:   Demerol [meperidine] and Meperidine hcl   Social History   Social History  . Marital status: Widowed    Spouse name: N/A  . Number of children:  0  . Years of education: N/A   Occupational History  . Retired    Social History Main Topics  . Smoking status: Never Smoker  . Smokeless tobacco: Never Used  . Alcohol use No  . Drug use: No  . Sexual activity: Not on file   Other Topics Concern  . Not on file   Social History Narrative  . No narrative on file     Family History:  Family History  Problem Relation Age of Onset  . Lung cancer Sister   . Colon cancer Mother   . Kidney cancer Father   . Colon cancer Sister        x2     ROS:   Please see the history of present illness.  All other systems are reviewed and otherwise negative.    PHYSICAL EXAM:   VS:  BP 116/70   Pulse 65   Ht 5' 6.5" (1.689 m)   Wt  177 lb (80.3 kg)   SpO2 98%   BMI 28.14 kg/m   BMI: Body mass index is 28.14 kg/m. GEN: Well nourished, well developed pleasant WF, in no acute distress  HEENT: normocephalic, atraumatic Neck: no JVD, carotid bruits, or masses Cardiac: RRR; no murmurs, rubs, or gallops, no edema  Respiratory:  clear to auscultation bilaterally, normal work of breathing GI: soft, nontender, nondistended, + BS MS: no deformity or atrophy  Skin: warm and dry, no rash Neuro:  Alert and Oriented x 3, defers to niece for some historical questions, Strength and sensation are intact, follows commands Psych: euthymic mood, full affect  Wt Readings from Last 3 Encounters:  11/09/16 177 lb (80.3 kg)  10/19/16 179 lb 10.8 oz (81.5 kg)  07/23/16 181 lb (82.1 kg)      Studies/Labs Reviewed:   EKG:  EKG was not ordered today  Recent Labs: 10/20/2016: TSH 2.218 10/21/2016: BUN 19; Creatinine, Ser 1.12; Hemoglobin 13.3; Platelets 140; Potassium 4.3; Sodium 140   Lipid Panel    Component Value Date/Time   CHOL 118 10/20/2016 0651   TRIG 92 10/20/2016 0651   HDL 39 (L) 10/20/2016 0651   CHOLHDL 3.0 10/20/2016 0651   VLDL 18 10/20/2016 0651   LDLCALC 61 10/20/2016 0651    Additional studies/ records that were reviewed today include: Summarized above.    ASSESSMENT & PLAN:   1. Chest pain, unspecified - details were difficult to ascertain given memory issues. This has resolved. It has not recurred while on PPI. Would continue observation for recurrent symptoms for now. As far as I can tell, she does not have an indication to be on both aspirin and Eliquis. She has no history of PCI and has never had a valve replacement. Bleeding risks outweigh benefit so will stop aspirin. 2. Paroxysmal atrial fibrillation - recently switched from Coumadin to Eliquis by PCP. Dose is appropriate for now as her weight is >60kg and Cr is <1.5. This will need to be periodically assessed. Stop ASA as above. 3. Essential HTN -  controlled. Continue present regimen. 4. CKD III - stable by recent labs. Would recommend episodic monitoring in the future given Eliquis use. Consider recheck BMET at 6 month follow-up. She is also followed by PCP regularly.  Disposition: F/u with Dr. Acie Fredrickson in 6 months.  Medication Adjustments/Labs and Tests Ordered: Current medicines are reviewed at length with the patient today.  Concerns regarding medicines are outlined above. Medication changes, Labs and Tests ordered today are summarized above and listed in  the Patient Instructions accessible in Encounters.   Signed, Charlie Pitter, PA-C  11/09/2016 3:34 PM    [facility] [address], [place], [place]  [address] Phone: [phone]; Fax: [phone]

## 2016-11-09 NOTE — Patient Instructions (Signed)
Medication Instructions:  Your physician has recommended you make the following change in your medication:  1.  STOP the Aspirin   Labwork: None ordered  Testing/Procedures: None ordered  Follow-Up: Your physician wants you to follow-up in: [place] DR. Acie Fredrickson   You will receive a reminder letter in the mail two months in advance. If you don't receive a letter, please call our office to schedule the follow-up appointment.   Any Other Special Instructions Will Be Listed Below (If Applicable).     If you need a refill on your cardiac medications before your next appointment, please call your pharmacy.

## 2017-01-20 ENCOUNTER — Ambulatory Visit: Payer: Medicare Other | Admitting: Neurology

## 2017-01-26 ENCOUNTER — Ambulatory Visit: Payer: Medicare Other | Admitting: Neurology

## 2017-04-13 ENCOUNTER — Emergency Department (HOSPITAL_COMMUNITY): Payer: Medicare Other

## 2017-04-13 ENCOUNTER — Encounter (HOSPITAL_COMMUNITY): Payer: Self-pay | Admitting: Emergency Medicine

## 2017-04-13 ENCOUNTER — Other Ambulatory Visit: Payer: Self-pay

## 2017-04-13 ENCOUNTER — Emergency Department (HOSPITAL_COMMUNITY)
Admission: EM | Admit: 2017-04-13 | Discharge: 2017-04-13 | Disposition: A | Discharge status: Home / Self Care | Payer: Medicare Other | Attending: Emergency Medicine | Admitting: Emergency Medicine

## 2017-04-13 DIAGNOSIS — Z7901 Long term (current) use of anticoagulants: Secondary | ICD-10-CM | POA: Insufficient documentation

## 2017-04-13 DIAGNOSIS — R55 Syncope and collapse: Secondary | ICD-10-CM | POA: Diagnosis present

## 2017-04-13 DIAGNOSIS — N183 Chronic kidney disease, stage 3 (moderate): Secondary | ICD-10-CM | POA: Diagnosis not present

## 2017-04-13 DIAGNOSIS — I129 Hypertensive chronic kidney disease with stage 1 through stage 4 chronic kidney disease, or unspecified chronic kidney disease: Secondary | ICD-10-CM | POA: Insufficient documentation

## 2017-04-13 DIAGNOSIS — Z79899 Other long term (current) drug therapy: Secondary | ICD-10-CM | POA: Insufficient documentation

## 2017-04-13 DIAGNOSIS — E1122 Type 2 diabetes mellitus with diabetic chronic kidney disease: Secondary | ICD-10-CM | POA: Insufficient documentation

## 2017-04-13 DIAGNOSIS — I483 Typical atrial flutter: Secondary | ICD-10-CM | POA: Diagnosis not present

## 2017-04-13 LAB — BASIC METABOLIC PANEL
ANION GAP: 12 (ref 5–15)
BUN: 20 mg/dL (ref 6–20)
CALCIUM: 9.5 mg/dL (ref 8.9–10.3)
CHLORIDE: 107 mmol/L (ref 101–111)
CO2: 21 mmol/L — AB (ref 22–32)
Creatinine, Ser: 1.09 mg/dL — ABNORMAL HIGH (ref 0.44–1.00)
GFR calc non Af Amer: 46 mL/min — ABNORMAL LOW (ref >60)
GFR, EST AFRICAN AMERICAN: 53 mL/min — AB (ref >60)
GLUCOSE: 99 mg/dL (ref 65–99)
POTASSIUM: 4.1 mmol/L (ref 3.5–5.1)
Sodium: 140 mmol/L (ref 135–145)

## 2017-04-13 LAB — CBC
HEMATOCRIT: 42.7 % (ref 36.0–46.0)
HEMOGLOBIN: 14 g/dL (ref 12.0–15.0)
MCH: 30.6 pg (ref 26.0–34.0)
MCHC: 32.8 g/dL (ref 30.0–36.0)
MCV: 93.4 fL (ref 78.0–100.0)
Platelets: 166 10*3/uL (ref 150–400)
RBC: 4.57 MIL/uL (ref 3.87–5.11)
RDW: 13.2 % (ref 11.5–15.5)
WBC: 7 10*3/uL (ref 4.0–10.5)

## 2017-04-13 LAB — TROPONIN I
TROPONIN I: 0.03 ng/mL — AB (ref <0.03)
Troponin I: <0.03 ng/mL (ref <0.03)

## 2017-04-13 MED ORDER — METOPROLOL TARTRATE 25 MG PO TABS
50.0000 mg | ORAL_TABLET | Freq: Once | ORAL | Status: DC
Start: 2017-04-13 — End: 2017-04-14
  Filled 2017-04-13: qty 2

## 2017-04-13 MED ORDER — METOPROLOL TARTRATE 25 MG PO TABS
50.0000 mg | ORAL_TABLET | Freq: Once | ORAL | Status: AC
  Administered 2017-04-13: 50 mg via ORAL
  Filled 2017-04-13: qty 2

## 2017-04-13 MED ORDER — METOPROLOL TARTRATE 50 MG PO TABS: 50.0000 mg | ORAL_TABLET | Freq: Two times a day (BID) | ORAL | 0 refills | Status: DC

## 2017-04-13 MED ORDER — METOPROLOL TARTRATE 25 MG PO TABS: 25.0000 mg | ORAL_TABLET | Freq: Once | ORAL | Status: DC

## 2017-04-13 NOTE — ED Triage Notes (Signed)
Pt arrives via EMS from home with complaints of near syncope and reports being out of home metoprolol for 2 days. Pt in aflutter with HR in 140s. EMS gave 10 mg cardizem with no change.

## 2017-04-13 NOTE — ED Provider Notes (Addendum)
[facility] EMERGENCY DEPARTMENT Provider Note   CSN: [id number] Arrival date & time: 04/13/17  1326     History   Chief Complaint Chief Complaint  Patient presents with  . Near Syncope    HPI RAIVEN BELIZAIRE is a 84 y.o. female.  HPI Patient developed acute onset palpitations and lightheadedness without syncope.  Patient has a history of A. fib and a flutter.  She has been out of her metoprolol for the past 2 days.  Her symptoms began acutely today while she was in the restroom.  She was given IV Cardizem and found to be in 2-1 atrial flutter but for EMS.  On arrival the emergency department she was initially still in flutter but at time of my evaluation she is in sinus rhythm.  She did have some chest tightness and shortness of breath with onset of her palpitations.  She still reports mild chest tightness anteriorly without radiation.  No shortness of breath.  No other complaints.    Past Medical History:  Diagnosis Date  . Allergic rhinitis   . Cataracts, bilateral   . Chest pain   . CKD (chronic kidney disease), stage III ([place])   . Dementia   . Diabetes mellitus   . Endometrial cancer ([place]) 2010  . GERD (gastroesophageal reflux disease)   . Hyperlipidemia   . Hypertension   . Lymphedema    left leg  . Normal coronary arteries 2011  . Obesity   . Osteoarthritis   . PAF (paroxysmal atrial fibrillation) ([place])    on Coumadin    Patient Active Problem List   Diagnosis Date Noted  . Mild cognitive impairment 07/27/2016  . Chronic pain of left knee 04/27/2016  . Long term current use of anticoagulant therapy 09/27/2014  . CKD (chronic kidney disease) stage 3, GFR 30-59 ml/min (HCC) 09/20/2014  . Pelvic fracture ([place]) 09/17/2014  . Sacral fracture, closed ([place]) 09/17/2014  . Chest pain 06/22/2013  . Other dysphagia 07/24/2012  . Atrial fibrillation ([place])   . Diabetes mellitus without complication ([place])   . Hypertension   . Hyperlipidemia      Past Surgical History:  Procedure Laterality Date  . APPENDECTOMY    . BACK SURGERY    . CARDIAC CATHETERIZATION  06/2009   REVEALED SMOOTH AND NORMAL CORONARY ARTERIES  . CHOLECYSTECTOMY    . VAGINAL HYSTERECTOMY      OB History    No data available       Home Medications    Prior to Admission medications   Medication Sig Start Date End Date Taking? Authorizing Provider  donepezil (ARICEPT) 10 MG tablet Take 10 mg by mouth at bedtime. 01/19/17  Yes [provider]  DULoxetine (CYMBALTA) 30 MG capsule Take 30 mg by mouth daily.   Yes [provider]  ELIQUIS 5 MG TABS tablet Take 5 mg by mouth 2 (two) times daily. 10/25/16  Yes [provider]  furosemide (LASIX) 20 MG tablet Take 20 mg by mouth daily as needed for fluid or edema.  10/29/16  Yes [provider]  irbesartan (AVAPRO) 300 MG tablet Take 300 mg by mouth daily.  05/23/13  Yes [provider]  LUTEIN PO Take 1 tablet by mouth daily.   Yes [provider]  metoprolol tartrate (LOPRESSOR) 100 MG tablet Take 50 mg by mouth 2 (two) times daily.   Yes [provider]  Multiple Vitamins-Minerals (ONE-A-DAY MENS 50+ ADVANTAGE) TABS Take 1 tablet by mouth  3 (three) times a week.   Yes [provider]  nitroGLYCERIN (NITROSTAT) 0.4 MG SL tablet Place 0.4 mg under the tongue every 5 (five) minutes as needed for chest pain.    Yes [provider]  omeprazole (PRILOSEC) 20 MG capsule Take 1 capsule (20 mg total) by mouth daily. 10/21/16  Yes Bonnielee Haff, MD  polyethylene glycol [facility] / GLYCOLAX) packet Take 17 g by mouth daily as needed for mild constipation.    Yes [provider]  simvastatin (ZOCOR) 40 MG tablet Take 20 mg by mouth daily at 6 PM.   Yes [provider]  oxyCODONE (ROXICODONE) 5 MG immediate release tablet Take 1 tablet (5 mg total) by mouth every 4 (four) hours as needed for severe pain. Patient not taking:  Reported on 04/13/2017 09/17/14   Jessy Oto, MD    Family History Family History  Problem Relation Age of Onset  . Lung cancer Sister   . Colon cancer Mother   . Kidney cancer Father   . Colon cancer Sister        x2    Social History Social History   Tobacco Use  . Smoking status: Never Smoker  . Smokeless tobacco: Never Used  Substance Use Topics  . Alcohol use: No  . Drug use: No     Allergies   Demerol [meperidine]   Review of Systems Review of Systems  All other systems reviewed and are negative.    Physical Exam Updated Vital Signs BP (!) 144/62   Pulse 72   Temp 97.6 F (36.4 C) (Oral)   Resp 19   Ht 5\' 6"  (1.676 m)   Wt 75.3 kg (166 lb)   SpO2 100%   BMI 26.79 kg/m   Physical Exam  Constitutional: She is oriented to person, place, and time. She appears well-developed and well-nourished. No distress.  HENT:  Head: Normocephalic and atraumatic.  Eyes: EOM are normal.  Neck: Normal range of motion.  Cardiovascular: Normal rate, regular rhythm and normal heart sounds.  Pulmonary/Chest: Effort normal and breath sounds normal.  Abdominal: Soft. She exhibits no distension. There is no tenderness.  Musculoskeletal: Normal range of motion.  Neurological: She is alert and oriented to person, place, and time.  Skin: Skin is warm and dry.  Psychiatric: She has a normal mood and affect. Judgment normal.  Nursing note and vitals reviewed.    ED Treatments / Results  Labs (all labs ordered are listed, but only abnormal results are displayed) Labs Reviewed  BASIC METABOLIC PANEL - Abnormal; Notable for the following components:      Result Value   CO2 21 (*)    Creatinine, Ser 1.09 (*)    GFR calc non Af Amer 46 (*)    GFR calc Af Amer 53 (*)    All other components within normal limits  CBC  TROPONIN I  TROPONIN I    EKG ECG interpretation #1  Date: 04/13/2017  Rate: 141  Rhythm: 2:1 atrial flutter  QRS Axis: normal  Intervals:  normal  ST/T Wave abnormalities: normal  Conduction Disutrbances: none  Narrative Interpretation:   Old EKG Reviewed: changed from prior ecg      EKG Interpretation #2  Date/Time:  Wednesday April 13 2017 14:25:06 EST Ventricular Rate:  73 PR Interval:    QRS Duration: 73 QT Interval:  387 QTC Calculation: 427 R Axis:   48 Text Interpretation:  Ectopic atrial rhythm No significant change was found atrial  flutter resolved Confirmed by Jola Schmidt [phone]) on 04/13/2017 4:43:48 PM       Radiology Dg Chest 2 View  Result Date: 04/13/2017 CLINICAL DATA:  Mid chest pain, shortness of Breath EXAM: CHEST  2 VIEW COMPARISON:  10/19/2016 FINDINGS: Heart and mediastinal contours are within normal limits. No focal opacities or effusions. No acute bony abnormality. Degenerative changes in the shoulders and thoracic spine. IMPRESSION: No active cardiopulmonary disease. Electronically Signed   By: Rolm Baptise M.D.   On: 04/13/2017 16:33    Procedures Procedures (including critical care time)  Medications Ordered in ED Medications - No data to display   Initial Impression / Assessment and Plan / ED Course  I have reviewed the triage vital signs and the nursing notes.  Pertinent labs & imaging results that were available during my care of the patient were reviewed by me and considered in my medical decision making (see chart for details).     Resolution of her atrial flutter likely with the IV Cardizem that was given by EMS and/or spontaneous conversion.  Back in sinus rhythm at this time.  Still with some mild chest tightness.  Atypical.  Patient will need to troponins.  Nonischemic EKG obtained as her second EKG.  Care to Dr Ralene Bathe to follow up on troponin x 2  Final Clinical Impressions(s) / ED Diagnoses   Final diagnoses:  None    ED Discharge Orders    None       Jola Schmidt, MD 04/13/17 1647    Jola Schmidt, MD 04/19/17 2300

## 2017-04-13 NOTE — ED Provider Notes (Signed)
Patient visit shared.  Patient with history of atrial flutter here for evaluation of near syncope with atrial flutter that converted in the emergency department.  Initial troponin is negative and repeat troponin is minimally elevated at 0.03.  Patient is pain-free and able to ambulate the department without difficulty.  No recurrent arrhythmia.  Current clinical picture is not consistent with acute ischemia or ACS, PE.  Patient was discussed with on-call cardiologist.  Plan to discharge patient home with close cardiology follow-up as well as close return precautions for any recurrent or worsening symptoms.  She is anticoagulated on Eliquis.   Quintella Reichert, MD 04/14/17 [phone]

## 2017-04-15 ENCOUNTER — Telehealth (HOSPITAL_COMMUNITY): Payer: Self-pay | Admitting: *Deleted

## 2017-04-15 NOTE — Telephone Encounter (Signed)
appt was made in afib clinic for ER follow-up with patient. Her niece, [place], called back and canceled the appointment stating she will call and make an appt with Dr. Acie Fredrickson for follow up. Appt canceled for 2/4

## 2017-04-18 ENCOUNTER — Ambulatory Visit (HOSPITAL_COMMUNITY): Payer: Medicare Other | Admitting: Nurse Practitioner

## 2017-05-02 ENCOUNTER — Ambulatory Visit: Payer: Medicare Other | Admitting: Physician Assistant

## 2017-05-02 ENCOUNTER — Encounter: Payer: Self-pay | Admitting: Physician Assistant

## 2017-05-02 VITALS — BP 124/70 | HR 52 | Ht 66.0 in | Wt 173.1 lb

## 2017-05-02 DIAGNOSIS — N183 Chronic kidney disease, stage 3 unspecified: Secondary | ICD-10-CM

## 2017-05-02 DIAGNOSIS — I4891 Unspecified atrial fibrillation: Secondary | ICD-10-CM | POA: Diagnosis not present

## 2017-05-02 DIAGNOSIS — E785 Hyperlipidemia, unspecified: Secondary | ICD-10-CM

## 2017-05-02 DIAGNOSIS — I10 Essential (primary) hypertension: Secondary | ICD-10-CM

## 2017-05-02 DIAGNOSIS — I4892 Unspecified atrial flutter: Secondary | ICD-10-CM

## 2017-05-02 NOTE — Patient Instructions (Signed)
Medication Instructions:  1. Your physician recommends that you continue on your current medications as directed. Please refer to the Current Medication list given to you today.   Labwork: NONE ORDERED TODAY  Testing/Procedures: Your physician has requested that you have an echocardiogram. Echocardiography is a painless test that uses sound waves to create images of your heart. It provides your doctor with information about the size and shape of your heart and how well your heart's chambers and valves are working. This procedure takes approximately one hour. There are no restrictions for this procedure.    Follow-Up: DR. Acie Fredrickson IN 3 MONTHS   Any Other Special Instructions Will Be Listed Below (If Applicable).     If you need a refill on your cardiac medications before your next appointment, please call your pharmacy.

## 2017-05-02 NOTE — Progress Notes (Signed)
Cardiology Office Note:    Date:  05/02/2017   ID:  Janice [name], DOB [birth year], MRN [id number]  PCP:  Harlan Stains, MD  Cardiologist:  Mertie Moores, MD   Referring MD: Harlan Stains, MD   Chief Complaint  Patient presents with  . Hospitalization Follow-up    ED visit with AFlutter    History of Present Illness:    Janice [name] is a 84 y.o. female with a hx of paroxysmal atrial fibrillation on chronic anticoagulation, mild dementia, normal coronary arteries in 2011, chronic kidney disease, diabetes, hypertension, hyperlipidemia, left lower externally lymphedema, endometrial cancer, acid reflux.  She was last seen in this office by Melina Copa, PA-C in August 2018 after an admission for chest pain.  Nuclear stress test demonstrated no ischemia.    Janice [name] returns for follow up after a recent trip to the ED.  She was recently seen in the emergency room 04/13/17 with near syncope and atrial flutter.  She converted to normal sinus rhythm after being given IV diltiazem.  Initial troponin was negative.  Follow-up troponin was 0.03.  Notes indicate the case was discussed with cardiology by telephone.  Recommendation was made for the patient to follow-up in the outpatient setting.  She is here today with her niece, Lynelle Smoke.  She has been doing well since she went to the ED.  She denies any recurrent palpitations.  She denies chest pain, shortness of breath, paroxysmal nocturnal dyspnea.  She has chronic left leg edema.   Prior CV studies:   The following studies were reviewed today:  Nuclear stress test 10/20/16 IMPRESSION: 1. There is a fixed defect involving the septum. There is also attenuation artifact involving the inferior and lateral walls. No stress-induced ischemia. 2. Normal left ventricular wall motion. 3. Left ventricular ejection fraction 74% 4. Non invasive risk stratification*: Low risk.  Cardiac catheterization 06/2009 Normal coronary arteries  Echo  05/05/09 Mild LVH, EF 29-52, grade 1 diastolic dysfunction, mild MR, mild LAE  Past Medical History:  Diagnosis Date  . Allergic rhinitis   . Cataracts, bilateral   . Chest pain   . CKD (chronic kidney disease), stage III ([place])   . Dementia   . Diabetes mellitus   . Endometrial cancer ([place]) 2010  . GERD (gastroesophageal reflux disease)   . Hyperlipidemia   . Hypertension   . Lymphedema    left leg  . Normal coronary arteries 2011  . Obesity   . Osteoarthritis   . PAF (paroxysmal atrial fibrillation) (HCC)    on Coumadin    Past Surgical History:  Procedure Laterality Date  . APPENDECTOMY    . BACK SURGERY    . CARDIAC CATHETERIZATION  06/2009   REVEALED SMOOTH AND NORMAL CORONARY ARTERIES  . CHOLECYSTECTOMY    . VAGINAL HYSTERECTOMY      Current Medications: Current Meds  Medication Sig  . donepezil (ARICEPT) 10 MG tablet Take 10 mg by mouth at bedtime.  . DULoxetine (CYMBALTA) 30 MG capsule Take 30 mg by mouth daily.  Marland Kitchen ELIQUIS 5 MG TABS tablet Take 5 mg by mouth 2 (two) times daily.  . furosemide (LASIX) 20 MG tablet Take 20 mg by mouth daily as needed for fluid or edema.   . irbesartan (AVAPRO) 300 MG tablet Take 300 mg by mouth daily.   . metoprolol tartrate (LOPRESSOR) 100 MG tablet Take 100 mg by mouth 2 (two) times daily.   . nitroGLYCERIN (NITROSTAT) 0.4 MG SL tablet Place 0.4 mg  under the tongue every 5 (five) minutes as needed for chest pain.   Marland Kitchen omeprazole (PRILOSEC) 20 MG capsule Take 1 capsule (20 mg total) by mouth daily.  Marland Kitchen oxyCODONE (ROXICODONE) 5 MG immediate release tablet Take 1 tablet (5 mg total) by mouth every 4 (four) hours as needed for severe pain.  . polyethylene glycol (MIRALAX / GLYCOLAX) packet Take 17 g by mouth daily as needed for mild constipation.   . simvastatin (ZOCOR) 40 MG tablet Take 20 mg by mouth daily at 6 PM.     Allergies:   Demerol [meperidine]   Social History   Tobacco Use  . Smoking status: Never Smoker  . Smokeless  tobacco: Never Used  Substance Use Topics  . Alcohol use: No  . Drug use: No     Family Hx: The patient's family history includes Colon cancer in her mother and sister; Kidney cancer in her father; Lung cancer in her sister.  ROS:   Please see the history of present illness.    ROS All other systems reviewed and are negative.   EKGs/Labs/Other Test Reviewed:    EKG:  EKG is  ordered today.  The ekg ordered today demonstrates sinus bradycardia, heart rate 52, normal axis, QTC 412 ms ECG from 04/13/17 personally reviewed-2:1 atrial flutter, heart rate 141  Recent Labs: 10/20/2016: TSH 2.218 04/13/2017: BUN 20; Creatinine, Ser 1.09; Hemoglobin 14.0; Platelets 166; Potassium 4.1; Sodium 140   Recent Lipid Panel Lab Results  Component Value Date/Time   CHOL 118 10/20/2016 06:51 AM   TRIG 92 10/20/2016 06:51 AM   HDL 39 (L) 10/20/2016 06:51 AM   CHOLHDL 3.0 10/20/2016 06:51 AM   LDLCALC 61 10/20/2016 06:51 AM    Physical Exam:    VS:  BP 124/70   Pulse (!) 52   Ht 5\' 6"  (1.676 m)   Wt 173 lb 1.9 oz (78.5 kg)   SpO2 96%   BMI 27.94 kg/m     Wt Readings from Last 3 Encounters:  05/02/17 173 lb 1.9 oz (78.5 kg)  04/13/17 166 lb (75.3 kg)  11/09/16 177 lb (80.3 kg)     Physical Exam  Constitutional: She is oriented to person, place, and time. She appears well-developed and well-nourished. No distress.  HENT:  Head: Normocephalic and atraumatic.  Neck: No JVD present.  Cardiovascular: Normal rate and regular rhythm.  No murmur heard. Pulmonary/Chest: Effort normal. She has no rales.  Abdominal: Soft.  Musculoskeletal: She exhibits edema (1+ R ankle edema).  Neurological: She is alert and oriented to person, place, and time.  Skin: Skin is warm and dry.    ASSESSMENT & PLAN:    1.  Atrial fibrillation and flutter ([place]) She had a recent visit to the ED for AFlutter with RVR.  This was her first episode of AFib/Flutter in many years.  She had missed her beta-blocker  for a couple days before.  She has not had any further symptoms. She did have minimally elevated Troponin in the ED.  We discussed this today.  I suspect this was all related to demand ischemia.  I have suggested an echo to follow up.  I did show her ECG to Dr. Allegra Lai (attending MD today).  He feels the rhythm is amenable to ablation if the patient is interested.  Janice [name] would like to manage things medically for now and avoid ablation if possible.  If she has recurrent AFlutter in the near future, we can refer her to EP  for evaluation.  -Continue metoprolol 100 mg Twice daily   -Continue Apixaban 5 mg Twice daily   -Obtain an echocardiogram   -Follow up in 3 months.   2.  Hyperlipidemia, unspecified hyperlipidemia type Continue statin.  3.  Essential hypertension The patient's blood pressure is controlled on her current regimen.  Continue current therapy.   4.  CKD (chronic kidney disease) stage 3, GFR 30-59 ml/min (HCC) Recent creatinine stable.   Dispo:  Return in about 3 months (around 07/30/2017) for Routine Follow Up, w/ Dr. Acie Fredrickson.   Medication Adjustments/Labs and Tests Ordered: Current medicines are reviewed at length with the patient today.  Concerns regarding medicines are outlined above.  Tests Ordered: Orders Placed This Encounter  Procedures  . EKG 12-Lead  . ECHOCARDIOGRAM COMPLETE   Medication Changes: No orders of the defined types were placed in this encounter.   Signed, Richardson Dopp, PA-C  05/02/2017 3:34 PM    [facility] [address], [place], [place]  [address] Phone: [phone]; Fax: [phone]

## 2017-05-13 ENCOUNTER — Other Ambulatory Visit: Payer: Self-pay

## 2017-05-13 ENCOUNTER — Ambulatory Visit (HOSPITAL_COMMUNITY): Payer: Medicare Other | Attending: Cardiology

## 2017-05-13 DIAGNOSIS — I119 Hypertensive heart disease without heart failure: Secondary | ICD-10-CM | POA: Diagnosis not present

## 2017-05-13 DIAGNOSIS — E119 Type 2 diabetes mellitus without complications: Secondary | ICD-10-CM | POA: Insufficient documentation

## 2017-05-13 DIAGNOSIS — E785 Hyperlipidemia, unspecified: Secondary | ICD-10-CM | POA: Insufficient documentation

## 2017-05-13 DIAGNOSIS — I4891 Unspecified atrial fibrillation: Secondary | ICD-10-CM | POA: Diagnosis not present

## 2017-05-13 DIAGNOSIS — I890 Lymphedema, not elsewhere classified: Secondary | ICD-10-CM | POA: Diagnosis not present

## 2017-05-13 DIAGNOSIS — I4892 Unspecified atrial flutter: Secondary | ICD-10-CM | POA: Insufficient documentation

## 2017-05-15 ENCOUNTER — Encounter: Payer: Self-pay | Admitting: Physician Assistant

## 2017-05-16 ENCOUNTER — Telehealth: Payer: Self-pay | Admitting: *Deleted

## 2017-05-16 NOTE — Telephone Encounter (Signed)
Tried to reach pt to go over echo results. No answer.

## 2017-05-16 NOTE — Telephone Encounter (Signed)
-----   Message from Liliane Shi, [place] sent at 05/15/2017  9:25 PM EST ----- Please call the patient. The echo shows normal heart function and just mild stiffness (diastolic dysfunction). Continue current medications and follow up as planned.  Please fax a copy of this study result to her PCP:  Harlan Stains, MD  Thanks! Richardson Dopp, PA-C    05/15/2017 9:23 PM

## 2017-05-17 NOTE — Telephone Encounter (Signed)
-----   Message from Liliane Shi, [place] sent at 05/15/2017  9:25 PM EST ----- Please call the patient. The echo shows normal heart function and just mild stiffness (diastolic dysfunction). Continue current medications and follow up as planned.  Please fax a copy of this study result to her PCP:  Harlan Stains, MD  Thanks! Richardson Dopp, PA-C    05/15/2017 9:23 PM

## 2017-05-17 NOTE — Telephone Encounter (Signed)
Pt has been notified of echo results by phone with verbal understanding. Pt thanked me for the call today. I will forward a copy of results to PCP as well.

## 2017-07-12 ENCOUNTER — Encounter: Payer: Self-pay | Admitting: Cardiovascular Disease

## 2017-07-29 ENCOUNTER — Ambulatory Visit: Payer: Medicare Other | Admitting: Cardiovascular Disease

## 2017-10-19 ENCOUNTER — Ambulatory Visit (INDEPENDENT_AMBULATORY_CARE_PROVIDER_SITE_OTHER): Payer: Medicare Other | Admitting: Physician Assistant

## 2017-10-19 ENCOUNTER — Encounter (INDEPENDENT_AMBULATORY_CARE_PROVIDER_SITE_OTHER): Payer: Self-pay | Admitting: Physician Assistant

## 2017-10-19 DIAGNOSIS — M1712 Unilateral primary osteoarthritis, left knee: Secondary | ICD-10-CM

## 2017-10-19 MED ORDER — LIDOCAINE HCL 1 % IJ SOLN
3.0000 mL | INTRAMUSCULAR | Status: AC | PRN
  Administered 2017-10-19: 3 mL

## 2017-10-19 MED ORDER — METHYLPREDNISOLONE ACETATE 40 MG/ML IJ SUSP
40.0000 mg | INTRAMUSCULAR | Status: AC | PRN
  Administered 2017-10-19: 40 mg via INTRA_ARTICULAR

## 2017-10-19 NOTE — Progress Notes (Signed)
   Procedure Note  Patient: Janice [name]             Date of Birth: 02-01-1932           MRN: [id number]             Visit Date: 10/19/2017 HPI: Ms. Dutko has known osteoarthritis of her left knee.  She states pain is getting worse.  She last had supplemental injection in the knee with no relief.  She is asking for a cortisone injection.  Is having difficulty walking.  She had no new injury.  Physical exam: Left knee no effusion positive edema no abnormal warmth or erythema no ecchymosis.  Left calf supple.  She has tenderness along medial joint line.  No instability valgus varus stressing.   Procedures: Visit Diagnoses: Unilateral primary osteoarthritis, left knee  Large Joint Inj on 10/19/2017 1:37 PM Indications: pain Details: 22 G 1.5 in needle, anterolateral approach  Arthrogram: No  Medications: 3 mL lidocaine 1 %; 40 mg methylPREDNISolone acetate 40 MG/ML Outcome: tolerated well, no immediate complications Procedure, treatment alternatives, risks and benefits explained, specific risks discussed. Consent was given by the patient. Immediately prior to procedure a time out was called to verify the correct patient, procedure, equipment, support staff and site/side marked as required. Patient was prepped and draped in the usual sterile fashion.     Plan: She will follow-up on an as-needed basis.  She is reminded she needs to monitor her glucose levels over the next few days due to the fact that steroid can raise her sugar levels.  She understands she can have cortisone injections no more often than every 3 months.

## 2018-09-28 DIAGNOSIS — E1121 Type 2 diabetes mellitus with diabetic nephropathy: Secondary | ICD-10-CM | POA: Diagnosis not present

## 2018-09-28 DIAGNOSIS — Z0000 Encounter for general adult medical examination without abnormal findings: Secondary | ICD-10-CM | POA: Diagnosis not present

## 2018-09-28 DIAGNOSIS — I480 Paroxysmal atrial fibrillation: Secondary | ICD-10-CM | POA: Diagnosis not present

## 2018-09-28 DIAGNOSIS — I129 Hypertensive chronic kidney disease with stage 1 through stage 4 chronic kidney disease, or unspecified chronic kidney disease: Secondary | ICD-10-CM | POA: Diagnosis not present

## 2018-09-28 DIAGNOSIS — E441 Mild protein-calorie malnutrition: Secondary | ICD-10-CM | POA: Diagnosis not present

## 2018-09-28 DIAGNOSIS — G3184 Mild cognitive impairment, so stated: Secondary | ICD-10-CM | POA: Diagnosis not present

## 2018-09-28 DIAGNOSIS — E785 Hyperlipidemia, unspecified: Secondary | ICD-10-CM | POA: Diagnosis not present

## 2018-09-28 DIAGNOSIS — N183 Chronic kidney disease, stage 3 (moderate): Secondary | ICD-10-CM | POA: Diagnosis not present

## 2018-09-28 DIAGNOSIS — E559 Vitamin D deficiency, unspecified: Secondary | ICD-10-CM | POA: Diagnosis not present

## 2018-10-10 DIAGNOSIS — N183 Chronic kidney disease, stage 3 (moderate): Secondary | ICD-10-CM | POA: Diagnosis not present

## 2018-11-17 ENCOUNTER — Telehealth: Payer: Self-pay

## 2018-11-17 NOTE — Telephone Encounter (Signed)

## 2018-11-21 ENCOUNTER — Other Ambulatory Visit: Payer: Self-pay

## 2018-11-21 ENCOUNTER — Telehealth: Payer: Self-pay

## 2018-11-21 ENCOUNTER — Telehealth (INDEPENDENT_AMBULATORY_CARE_PROVIDER_SITE_OTHER): Payer: Medicare HMO | Admitting: Cardiovascular Disease

## 2018-11-21 DIAGNOSIS — I10 Essential (primary) hypertension: Secondary | ICD-10-CM

## 2018-11-21 DIAGNOSIS — Z7189 Other specified counseling: Secondary | ICD-10-CM

## 2018-11-21 DIAGNOSIS — I4891 Unspecified atrial fibrillation: Secondary | ICD-10-CM

## 2018-11-21 NOTE — Progress Notes (Signed)
Virtual Visit via Telephone Note   This visit type was conducted due to national recommendations for restrictions regarding the COVID-19 Pandemic (e.g. social distancing) in an effort to limit this patient's exposure and mitigate transmission in our community.  Due to her co-morbid illnesses, this patient is at least at moderate risk for complications without adequate follow up.  This format is felt to be most appropriate for this patient at this time.  The patient did not have access to video technology/had technical difficulties with video requiring transitioning to audio format only (telephone).  All issues noted in this document were discussed and addressed.  No physical exam could be performed with this format.  Please refer to the patient's chart for her  consent to telehealth for [facility].   Date:  11/21/2018   ID:  Janice [name], DOB 02-21-1932, MRN [id number]  Patient Location: Home Provider Location: Home  PCP:  Harlan Stains, MD  Cardiologist:  Mertie Moores, MD  Electrophysiologist:  None   Problem List: 1. Atrial fibrillation 2. Diabetes mellitus  3. Hypertension 4. Hyperlipidemia 5. Chest pain-cardiac catheterization in April, 2011 revealed smooth and normal coronary arteries  History of Present Illness:  Janice [name] is a 85 yo with a history of intermittent atrial fibrillation. She has been in normal sinus rhythm for the past several years. She also has a history of diabetes mellitus, hypertension, and hyperlipidemia. She's had episodes of chest pain in the past but has normal coronary arteries by heart catheterization in 2011.  She has not been exercising as much as she would like.  She used to go to water aerobics but has not been in a while  June 12, 2015: Janice [name] is seen again after a 3 year absence.    Has known PAF Doing well.   No urgent issues   July 07, 2016:  Doing well.  Has gained some weight .  Not exercising as much    Evaluation  Performed:  Follow-Up Visit  Chief Complaint:  Atrial fib   Sept. 8, 2020    Janice [name] is a 85 y.o. female with atrial fib.   I last saw her in April , 2018. She has been seen by Melina Copa, PA and Richardson Dopp, PA in recent years.  She had an episode of syncope associated with atrial flutter in Feb. 2019. She is on Eliquis. Eliquis in March, 2019 showed normal LV systolic function, grade 1 diastolic dysfunction.  No cardiac complaints.  No further episodes of syncope Is on eliquis,  No bleeding issues Takes Lasix as needed - has not taken in a while  Avoids salty foods   The patient does not have symptoms concerning for COVID-19 infection (fever, chills, cough, or new shortness of breath).    Past Medical History:  Diagnosis Date  . Allergic rhinitis   . Cataracts, bilateral   . Chest pain   . CKD (chronic kidney disease), stage III ([place])   . Dementia   . Diabetes mellitus   . Endometrial cancer ([place]) 2010  . GERD (gastroesophageal reflux disease)   . History of echocardiogram    Echo 3/19: EF 60-65, no RWMA, Gr 1 DD, normal RVSF, PASP 32  . Hyperlipidemia   . Hypertension   . Lymphedema    left leg  . Normal coronary arteries 2011  . Obesity   . Osteoarthritis   . PAF (paroxysmal atrial fibrillation) ([facility])    on Coumadin   Past Surgical History:  Procedure Laterality Date  . APPENDECTOMY    . BACK SURGERY    . CARDIAC CATHETERIZATION  06/2009   REVEALED SMOOTH AND NORMAL CORONARY ARTERIES  . CHOLECYSTECTOMY    . VAGINAL HYSTERECTOMY       No outpatient medications have been marked as taking for the 11/21/18 encounter (Telemedicine) with Zenda Herskowitz, Wonda Cheng, MD.     Allergies:   Demerol [meperidine]   Social History   Tobacco Use  . Smoking status: Never Smoker  . Smokeless tobacco: Never Used  Substance Use Topics  . Alcohol use: No  . Drug use: No     Family Hx: The patient's family history includes Colon cancer in her mother and sister;  Kidney cancer in her father; Lung cancer in her sister.  ROS:   Please see the history of present illness.     All other systems reviewed and are negative.   Prior CV studies:   The following studies were reviewed today:    Labs/Other Tests and Data Reviewed:    EKG:  No ECG reviewed.  Recent Labs: No results found for requested labs within last 8760 hours.   Recent Lipid Panel Lab Results  Component Value Date/Time   CHOL 118 10/20/2016 06:51 AM   TRIG 92 10/20/2016 06:51 AM   HDL 39 (L) 10/20/2016 06:51 AM   CHOLHDL 3.0 10/20/2016 06:51 AM   LDLCALC 61 10/20/2016 06:51 AM    Wt Readings from Last 3 Encounters:  05/02/17 173 lb 1.9 oz (78.5 kg)  04/13/17 166 lb (75.3 kg)  11/09/16 177 lb (80.3 kg)     Objective:    Vital Signs:  There were no vitals taken for this visit.     ASSESSMENT & PLAN:    1. Chronic atrial fibrillation: Janice [name] seems to be doing well.  She was not able to cycle heart rate and blood pressure but she states that she feels fine.  She is on Eliquis.  She also is on metoprolol.  Continue current medications.  2.  Hyperlipidemia: Managed by her primary medical doctor  3.  Hypertension: Continue current medications.  She was not able to measure her blood pressure today.  She states that her blood pressure was okay when she was last seen by her primary medical doctor.  COVID-19 Education: The signs and symptoms of COVID-19 were discussed with the patient and how to seek care for testing (follow up with PCP or arrange E-visit).  The importance of social distancing was discussed today.  Time:   Today, I have spent  18 minutes with the patient with telehealth technology discussing the above problems.     Medication Adjustments/Labs and Tests Ordered: Current medicines are reviewed at length with the patient today.  Concerns regarding medicines are outlined above.   Tests Ordered: No orders of the defined types were placed in this  encounter.   Medication Changes: No orders of the defined types were placed in this encounter.   Follow Up:  In Person in 1 year(s)  Signed, Mertie Moores, MD  11/21/2018 8:29 AM    [facility]

## 2018-11-21 NOTE — Telephone Encounter (Signed)
Call placed to Pt.  Pt needs phone visit.  Put # in appt notes.

## 2018-11-22 NOTE — Patient Instructions (Signed)
Medication Instructions:  Your physician recommends that you continue on your current medications as directed. Please refer to the Current Medication list given to you today.  Labwork: None ordered.  Testing/Procedures: None ordered.  Follow-Up: Your physician wants you to follow-up in: one year with Dr. Nahser. You will receive a reminder letter in the mail two months in advance. If you don't receive a letter, please call our office to schedule the follow-up appointment.   Any Other Special Instructions Will Be Listed Below (If Applicable).  If you need a refill on your cardiac medications before your next appointment, please call your pharmacy.   

## 2019-04-02 DIAGNOSIS — E785 Hyperlipidemia, unspecified: Secondary | ICD-10-CM | POA: Diagnosis not present

## 2019-04-02 DIAGNOSIS — E1121 Type 2 diabetes mellitus with diabetic nephropathy: Secondary | ICD-10-CM | POA: Diagnosis not present

## 2019-04-02 DIAGNOSIS — I129 Hypertensive chronic kidney disease with stage 1 through stage 4 chronic kidney disease, or unspecified chronic kidney disease: Secondary | ICD-10-CM | POA: Diagnosis not present

## 2019-04-02 DIAGNOSIS — I480 Paroxysmal atrial fibrillation: Secondary | ICD-10-CM | POA: Diagnosis not present

## 2019-04-02 DIAGNOSIS — F325 Major depressive disorder, single episode, in full remission: Secondary | ICD-10-CM | POA: Diagnosis not present

## 2019-04-02 DIAGNOSIS — K219 Gastro-esophageal reflux disease without esophagitis: Secondary | ICD-10-CM | POA: Diagnosis not present

## 2019-04-02 DIAGNOSIS — E559 Vitamin D deficiency, unspecified: Secondary | ICD-10-CM | POA: Diagnosis not present

## 2019-04-02 DIAGNOSIS — G3184 Mild cognitive impairment, so stated: Secondary | ICD-10-CM | POA: Diagnosis not present

## 2019-04-02 DIAGNOSIS — N183 Chronic kidney disease, stage 3 unspecified: Secondary | ICD-10-CM | POA: Diagnosis not present

## 2019-08-28 DIAGNOSIS — I480 Paroxysmal atrial fibrillation: Secondary | ICD-10-CM | POA: Diagnosis not present

## 2019-08-28 DIAGNOSIS — M47812 Spondylosis without myelopathy or radiculopathy, cervical region: Secondary | ICD-10-CM | POA: Diagnosis not present

## 2019-08-28 DIAGNOSIS — M1612 Unilateral primary osteoarthritis, left hip: Secondary | ICD-10-CM | POA: Diagnosis not present

## 2019-08-28 DIAGNOSIS — E1121 Type 2 diabetes mellitus with diabetic nephropathy: Secondary | ICD-10-CM | POA: Diagnosis not present

## 2019-08-28 DIAGNOSIS — E785 Hyperlipidemia, unspecified: Secondary | ICD-10-CM | POA: Diagnosis not present

## 2019-08-28 DIAGNOSIS — M160 Bilateral primary osteoarthritis of hip: Secondary | ICD-10-CM | POA: Diagnosis not present

## 2019-08-28 DIAGNOSIS — N183 Chronic kidney disease, stage 3 unspecified: Secondary | ICD-10-CM | POA: Diagnosis not present

## 2019-08-28 DIAGNOSIS — I129 Hypertensive chronic kidney disease with stage 1 through stage 4 chronic kidney disease, or unspecified chronic kidney disease: Secondary | ICD-10-CM | POA: Diagnosis not present

## 2019-08-28 DIAGNOSIS — F325 Major depressive disorder, single episode, in full remission: Secondary | ICD-10-CM | POA: Diagnosis not present

## 2019-09-14 DIAGNOSIS — E785 Hyperlipidemia, unspecified: Secondary | ICD-10-CM | POA: Diagnosis not present

## 2019-09-14 DIAGNOSIS — E1121 Type 2 diabetes mellitus with diabetic nephropathy: Secondary | ICD-10-CM | POA: Diagnosis not present

## 2019-09-14 DIAGNOSIS — E559 Vitamin D deficiency, unspecified: Secondary | ICD-10-CM | POA: Diagnosis not present

## 2019-11-02 DIAGNOSIS — F325 Major depressive disorder, single episode, in full remission: Secondary | ICD-10-CM | POA: Diagnosis not present

## 2019-11-02 DIAGNOSIS — I480 Paroxysmal atrial fibrillation: Secondary | ICD-10-CM | POA: Diagnosis not present

## 2019-11-02 DIAGNOSIS — I129 Hypertensive chronic kidney disease with stage 1 through stage 4 chronic kidney disease, or unspecified chronic kidney disease: Secondary | ICD-10-CM | POA: Diagnosis not present

## 2019-11-02 DIAGNOSIS — M160 Bilateral primary osteoarthritis of hip: Secondary | ICD-10-CM | POA: Diagnosis not present

## 2019-11-02 DIAGNOSIS — E785 Hyperlipidemia, unspecified: Secondary | ICD-10-CM | POA: Diagnosis not present

## 2019-11-02 DIAGNOSIS — M1612 Unilateral primary osteoarthritis, left hip: Secondary | ICD-10-CM | POA: Diagnosis not present

## 2019-11-02 DIAGNOSIS — N183 Chronic kidney disease, stage 3 unspecified: Secondary | ICD-10-CM | POA: Diagnosis not present

## 2019-11-02 DIAGNOSIS — E1121 Type 2 diabetes mellitus with diabetic nephropathy: Secondary | ICD-10-CM | POA: Diagnosis not present

## 2019-11-02 DIAGNOSIS — M47812 Spondylosis without myelopathy or radiculopathy, cervical region: Secondary | ICD-10-CM | POA: Diagnosis not present

## 2019-12-26 DIAGNOSIS — F325 Major depressive disorder, single episode, in full remission: Secondary | ICD-10-CM | POA: Diagnosis not present

## 2019-12-26 DIAGNOSIS — I480 Paroxysmal atrial fibrillation: Secondary | ICD-10-CM | POA: Diagnosis not present

## 2019-12-26 DIAGNOSIS — N183 Chronic kidney disease, stage 3 unspecified: Secondary | ICD-10-CM | POA: Diagnosis not present

## 2019-12-26 DIAGNOSIS — E559 Vitamin D deficiency, unspecified: Secondary | ICD-10-CM | POA: Diagnosis not present

## 2019-12-26 DIAGNOSIS — I129 Hypertensive chronic kidney disease with stage 1 through stage 4 chronic kidney disease, or unspecified chronic kidney disease: Secondary | ICD-10-CM | POA: Diagnosis not present

## 2019-12-26 DIAGNOSIS — Z23 Encounter for immunization: Secondary | ICD-10-CM | POA: Diagnosis not present

## 2019-12-26 DIAGNOSIS — E785 Hyperlipidemia, unspecified: Secondary | ICD-10-CM | POA: Diagnosis not present

## 2019-12-26 DIAGNOSIS — E1121 Type 2 diabetes mellitus with diabetic nephropathy: Secondary | ICD-10-CM | POA: Diagnosis not present

## 2019-12-26 DIAGNOSIS — Z0000 Encounter for general adult medical examination without abnormal findings: Secondary | ICD-10-CM | POA: Diagnosis not present

## 2020-02-20 ENCOUNTER — Emergency Department (HOSPITAL_COMMUNITY): Payer: Medicare HMO

## 2020-02-20 ENCOUNTER — Inpatient Hospital Stay (HOSPITAL_COMMUNITY)
Admission: EM | Admit: 2020-02-20 | Discharge: 2020-02-24 | DRG: 871 | Disposition: A | Discharge status: Hospice | Payer: Medicare HMO | Attending: Family Medicine | Admitting: Family Medicine

## 2020-02-20 ENCOUNTER — Encounter (HOSPITAL_COMMUNITY): Payer: Self-pay

## 2020-02-20 DIAGNOSIS — M1612 Unilateral primary osteoarthritis, left hip: Secondary | ICD-10-CM | POA: Diagnosis not present

## 2020-02-20 DIAGNOSIS — G9341 Metabolic encephalopathy: Secondary | ICD-10-CM | POA: Diagnosis not present

## 2020-02-20 DIAGNOSIS — E876 Hypokalemia: Secondary | ICD-10-CM | POA: Diagnosis present

## 2020-02-20 DIAGNOSIS — N183 Chronic kidney disease, stage 3 unspecified: Secondary | ICD-10-CM | POA: Diagnosis present

## 2020-02-20 DIAGNOSIS — F0390 Unspecified dementia without behavioral disturbance: Secondary | ICD-10-CM | POA: Diagnosis present

## 2020-02-20 DIAGNOSIS — Z885 Allergy status to narcotic agent status: Secondary | ICD-10-CM

## 2020-02-20 DIAGNOSIS — R6510 Systemic inflammatory response syndrome (SIRS) of non-infectious origin without acute organ dysfunction: Secondary | ICD-10-CM | POA: Diagnosis present

## 2020-02-20 DIAGNOSIS — J9601 Acute respiratory failure with hypoxia: Secondary | ICD-10-CM | POA: Diagnosis not present

## 2020-02-20 DIAGNOSIS — I130 Hypertensive heart and chronic kidney disease with heart failure and stage 1 through stage 4 chronic kidney disease, or unspecified chronic kidney disease: Secondary | ICD-10-CM | POA: Diagnosis present

## 2020-02-20 DIAGNOSIS — E860 Dehydration: Secondary | ICD-10-CM | POA: Diagnosis present

## 2020-02-20 DIAGNOSIS — Z20822 Contact with and (suspected) exposure to covid-19: Secondary | ICD-10-CM | POA: Diagnosis not present

## 2020-02-20 DIAGNOSIS — U071 COVID-19: Secondary | ICD-10-CM | POA: Diagnosis not present

## 2020-02-20 DIAGNOSIS — N179 Acute kidney failure, unspecified: Secondary | ICD-10-CM | POA: Diagnosis present

## 2020-02-20 DIAGNOSIS — D500 Iron deficiency anemia secondary to blood loss (chronic): Secondary | ICD-10-CM | POA: Diagnosis present

## 2020-02-20 DIAGNOSIS — Z800 Family history of malignant neoplasm of digestive organs: Secondary | ICD-10-CM

## 2020-02-20 DIAGNOSIS — K219 Gastro-esophageal reflux disease without esophagitis: Secondary | ICD-10-CM | POA: Diagnosis present

## 2020-02-20 DIAGNOSIS — M2550 Pain in unspecified joint: Secondary | ICD-10-CM | POA: Diagnosis not present

## 2020-02-20 DIAGNOSIS — R000 Tachycardia, unspecified: Secondary | ICD-10-CM

## 2020-02-20 DIAGNOSIS — Z8051 Family history of malignant neoplasm of kidney: Secondary | ICD-10-CM

## 2020-02-20 DIAGNOSIS — I480 Paroxysmal atrial fibrillation: Secondary | ICD-10-CM | POA: Diagnosis present

## 2020-02-20 DIAGNOSIS — Z515 Encounter for palliative care: Secondary | ICD-10-CM

## 2020-02-20 DIAGNOSIS — M6282 Rhabdomyolysis: Secondary | ICD-10-CM | POA: Diagnosis present

## 2020-02-20 DIAGNOSIS — D72829 Elevated white blood cell count, unspecified: Secondary | ICD-10-CM

## 2020-02-20 DIAGNOSIS — E119 Type 2 diabetes mellitus without complications: Secondary | ICD-10-CM | POA: Diagnosis not present

## 2020-02-20 DIAGNOSIS — N1832 Chronic kidney disease, stage 3b: Secondary | ICD-10-CM | POA: Diagnosis not present

## 2020-02-20 DIAGNOSIS — Z7901 Long term (current) use of anticoagulants: Secondary | ICD-10-CM

## 2020-02-20 DIAGNOSIS — E872 Acidosis, unspecified: Secondary | ICD-10-CM

## 2020-02-20 DIAGNOSIS — R6520 Severe sepsis without septic shock: Secondary | ICD-10-CM | POA: Diagnosis present

## 2020-02-20 DIAGNOSIS — I4891 Unspecified atrial fibrillation: Secondary | ICD-10-CM | POA: Diagnosis present

## 2020-02-20 DIAGNOSIS — I5032 Chronic diastolic (congestive) heart failure: Secondary | ICD-10-CM | POA: Diagnosis present

## 2020-02-20 DIAGNOSIS — M47812 Spondylosis without myelopathy or radiculopathy, cervical region: Secondary | ICD-10-CM | POA: Diagnosis not present

## 2020-02-20 DIAGNOSIS — E870 Hyperosmolality and hypernatremia: Secondary | ICD-10-CM | POA: Diagnosis not present

## 2020-02-20 DIAGNOSIS — E1122 Type 2 diabetes mellitus with diabetic chronic kidney disease: Secondary | ICD-10-CM | POA: Diagnosis present

## 2020-02-20 DIAGNOSIS — I482 Chronic atrial fibrillation, unspecified: Secondary | ICD-10-CM | POA: Diagnosis not present

## 2020-02-20 DIAGNOSIS — I10 Essential (primary) hypertension: Secondary | ICD-10-CM | POA: Diagnosis present

## 2020-02-20 DIAGNOSIS — A4189 Other specified sepsis: Principal | ICD-10-CM | POA: Diagnosis present

## 2020-02-20 DIAGNOSIS — R778 Other specified abnormalities of plasma proteins: Secondary | ICD-10-CM | POA: Diagnosis not present

## 2020-02-20 DIAGNOSIS — R404 Transient alteration of awareness: Secondary | ICD-10-CM | POA: Diagnosis not present

## 2020-02-20 DIAGNOSIS — W19XXXA Unspecified fall, initial encounter: Secondary | ICD-10-CM | POA: Diagnosis not present

## 2020-02-20 DIAGNOSIS — Z043 Encounter for examination and observation following other accident: Secondary | ICD-10-CM | POA: Diagnosis not present

## 2020-02-20 DIAGNOSIS — N1831 Chronic kidney disease, stage 3a: Secondary | ICD-10-CM | POA: Diagnosis present

## 2020-02-20 DIAGNOSIS — R7989 Other specified abnormal findings of blood chemistry: Secondary | ICD-10-CM | POA: Diagnosis not present

## 2020-02-20 DIAGNOSIS — G3184 Mild cognitive impairment, so stated: Secondary | ICD-10-CM | POA: Diagnosis present

## 2020-02-20 DIAGNOSIS — Z7401 Bed confinement status: Secondary | ICD-10-CM | POA: Diagnosis not present

## 2020-02-20 DIAGNOSIS — Z79899 Other long term (current) drug therapy: Secondary | ICD-10-CM

## 2020-02-20 DIAGNOSIS — R5381 Other malaise: Secondary | ICD-10-CM | POA: Diagnosis not present

## 2020-02-20 DIAGNOSIS — I248 Other forms of acute ischemic heart disease: Secondary | ICD-10-CM | POA: Diagnosis present

## 2020-02-20 DIAGNOSIS — Z8542 Personal history of malignant neoplasm of other parts of uterus: Secondary | ICD-10-CM

## 2020-02-20 DIAGNOSIS — R52 Pain, unspecified: Secondary | ICD-10-CM | POA: Diagnosis not present

## 2020-02-20 DIAGNOSIS — R609 Edema, unspecified: Secondary | ICD-10-CM | POA: Diagnosis not present

## 2020-02-20 DIAGNOSIS — E785 Hyperlipidemia, unspecified: Secondary | ICD-10-CM | POA: Diagnosis present

## 2020-02-20 DIAGNOSIS — Z66 Do not resuscitate: Secondary | ICD-10-CM | POA: Diagnosis present

## 2020-02-20 DIAGNOSIS — T796XXA Traumatic ischemia of muscle, initial encounter: Secondary | ICD-10-CM

## 2020-02-20 DIAGNOSIS — K921 Melena: Secondary | ICD-10-CM | POA: Diagnosis present

## 2020-02-20 DIAGNOSIS — M4802 Spinal stenosis, cervical region: Secondary | ICD-10-CM | POA: Diagnosis not present

## 2020-02-20 DIAGNOSIS — Z801 Family history of malignant neoplasm of trachea, bronchus and lung: Secondary | ICD-10-CM

## 2020-02-20 DIAGNOSIS — Z7189 Other specified counseling: Secondary | ICD-10-CM | POA: Diagnosis not present

## 2020-02-20 DIAGNOSIS — R55 Syncope and collapse: Secondary | ICD-10-CM | POA: Diagnosis not present

## 2020-02-20 DIAGNOSIS — M25562 Pain in left knee: Secondary | ICD-10-CM | POA: Diagnosis not present

## 2020-02-20 LAB — CBC WITH DIFFERENTIAL/PLATELET
Abs Immature Granulocytes: 0.26 10*3/uL — ABNORMAL HIGH (ref 0.00–0.07)
Basophils Absolute: 0.1 10*3/uL (ref 0.0–0.1)
Basophils Relative: 0 %
Eosinophils Absolute: 0 10*3/uL (ref 0.0–0.5)
Eosinophils Relative: 0 %
HCT: 53.9 % — ABNORMAL HIGH (ref 36.0–46.0)
Hemoglobin: 17.2 g/dL — ABNORMAL HIGH (ref 12.0–15.0)
Immature Granulocytes: 1 %
Lymphocytes Relative: 4 %
Lymphs Abs: 1 10*3/uL (ref 0.7–4.0)
MCH: 30.1 pg (ref 26.0–34.0)
MCHC: 31.9 g/dL (ref 30.0–36.0)
MCV: 94.2 fL (ref 80.0–100.0)
Monocytes Absolute: 1.9 10*3/uL — ABNORMAL HIGH (ref 0.1–1.0)
Monocytes Relative: 8 %
Neutro Abs: 20.2 10*3/uL — ABNORMAL HIGH (ref 1.7–7.7)
Neutrophils Relative %: 87 %
Platelets: 193 10*3/uL (ref 150–400)
RBC: 5.72 MIL/uL — ABNORMAL HIGH (ref 3.87–5.11)
RDW: 13 % (ref 11.5–15.5)
WBC: 23.4 10*3/uL — ABNORMAL HIGH (ref 4.0–10.5)
nRBC: 0 % (ref 0.0–0.2)

## 2020-02-20 LAB — COMPREHENSIVE METABOLIC PANEL
ALT: 31 U/L (ref 0–44)
AST: 106 U/L — ABNORMAL HIGH (ref 15–41)
Albumin: 2.7 g/dL — ABNORMAL LOW (ref 3.5–5.0)
Alkaline Phosphatase: 95 U/L (ref 38–126)
Anion gap: 19 — ABNORMAL HIGH (ref 5–15)
BUN: 63 mg/dL — ABNORMAL HIGH (ref 8–23)
CO2: 16 mmol/L — ABNORMAL LOW (ref 22–32)
Calcium: 7.9 mg/dL — ABNORMAL LOW (ref 8.9–10.3)
Chloride: 111 mmol/L (ref 98–111)
Creatinine, Ser: 4.61 mg/dL — ABNORMAL HIGH (ref 0.44–1.00)
GFR, Estimated: 9 mL/min — ABNORMAL LOW (ref >60)
Glucose, Bld: 193 mg/dL — ABNORMAL HIGH (ref 70–99)
Potassium: 2.6 mmol/L — CL (ref 3.5–5.1)
Sodium: 146 mmol/L — ABNORMAL HIGH (ref 135–145)
Total Bilirubin: 1.6 mg/dL — ABNORMAL HIGH (ref 0.3–1.2)
Total Protein: 5.7 g/dL — ABNORMAL LOW (ref 6.5–8.1)

## 2020-02-20 LAB — PROTIME-INR
INR: 1.2 (ref 0.8–1.2)
Prothrombin Time: 14.9 seconds (ref 11.4–15.2)

## 2020-02-20 LAB — RESP PANEL BY RT-PCR (FLU A&B, COVID) ARPGX2
Influenza A by PCR: NEGATIVE
Influenza B by PCR: NEGATIVE
SARS Coronavirus 2 by RT PCR: POSITIVE — AB

## 2020-02-20 LAB — APTT: aPTT: 21 seconds — ABNORMAL LOW (ref 24–36)

## 2020-02-20 LAB — TROPONIN I (HIGH SENSITIVITY)
Troponin I (High Sensitivity): 667 ng/L (ref <18)
Troponin I (High Sensitivity): 606 ng/L (ref <18)
Troponin I (High Sensitivity): 360 ng/L (ref <18)

## 2020-02-20 LAB — CK: Total CK: 2078 U/L — ABNORMAL HIGH (ref 38–234)

## 2020-02-20 LAB — LACTIC ACID, PLASMA
Lactic Acid, Venous: 4.4 mmol/L (ref 0.5–1.9)
Lactic Acid, Venous: 4 mmol/L (ref 0.5–1.9)
Lactic Acid, Venous: 4.4 mmol/L (ref 0.5–1.9)

## 2020-02-20 LAB — MAGNESIUM: Magnesium: 1.8 mg/dL (ref 1.7–2.4)

## 2020-02-20 MED ORDER — ONDANSETRON HCL 4 MG PO TABS: 4.0000 mg | ORAL_TABLET | Freq: Four times a day (QID) | ORAL | Status: DC | PRN

## 2020-02-20 MED ORDER — DONEPEZIL HCL 5 MG PO TABS
10.0000 mg | ORAL_TABLET | Freq: Every day | ORAL | Status: DC
  Administered 2020-02-20: 10 mg via ORAL
  Filled 2020-02-20: qty 2

## 2020-02-20 MED ORDER — POTASSIUM CHLORIDE 10 MEQ/100ML IV SOLN
10.0000 meq | INTRAVENOUS | Status: AC
  Administered 2020-02-20 (×6): 10 meq via INTRAVENOUS
  Filled 2020-02-20 (×6): qty 100

## 2020-02-20 MED ORDER — LACTATED RINGERS IV BOLUS
500.0000 mL | Freq: Once | INTRAVENOUS | Status: AC
  Administered 2020-02-20: 500 mL via INTRAVENOUS

## 2020-02-20 MED ORDER — PIPERACILLIN-TAZOBACTAM 3.375 G IVPB 30 MIN
3.3750 g | Freq: Once | INTRAVENOUS | Status: AC
  Administered 2020-02-20: 3.375 g via INTRAVENOUS
  Filled 2020-02-20: qty 50

## 2020-02-20 MED ORDER — SODIUM CHLORIDE 0.9 % IV BOLUS
500.0000 mL | Freq: Once | INTRAVENOUS | Status: AC
  Administered 2020-02-20: 500 mL via INTRAVENOUS

## 2020-02-20 MED ORDER — LACTATED RINGERS IV BOLUS: 1000.0000 mL | Freq: Once | INTRAVENOUS | Status: DC

## 2020-02-20 MED ORDER — SODIUM CHLORIDE 0.9% FLUSH
3.0000 mL | Freq: Two times a day (BID) | INTRAVENOUS | Status: DC
  Administered 2020-02-20 – 2020-02-24 (×8): 3 mL via INTRAVENOUS

## 2020-02-20 MED ORDER — ONDANSETRON HCL 4 MG/2ML IJ SOLN: 4.0000 mg | Freq: Four times a day (QID) | INTRAMUSCULAR | Status: DC | PRN

## 2020-02-20 MED ORDER — ACETAMINOPHEN 325 MG PO TABS
650.0000 mg | ORAL_TABLET | Freq: Four times a day (QID) | ORAL | Status: DC | PRN
  Administered 2020-02-20: 650 mg via ORAL
  Filled 2020-02-20: qty 2

## 2020-02-20 MED ORDER — VANCOMYCIN HCL IN DEXTROSE 1-5 GM/200ML-% IV SOLN
1000.0000 mg | Freq: Once | INTRAVENOUS | Status: AC
  Administered 2020-02-20: 1000 mg via INTRAVENOUS
  Filled 2020-02-20: qty 200

## 2020-02-20 MED ORDER — MEMANTINE HCL 5 MG PO TABS
5.0000 mg | ORAL_TABLET | Freq: Two times a day (BID) | ORAL | Status: DC
  Administered 2020-02-20 – 2020-02-21 (×2): 5 mg via ORAL
  Filled 2020-02-20 (×2): qty 1

## 2020-02-20 MED ORDER — SODIUM CHLORIDE 0.9 % IV SOLN: INTRAVENOUS | Status: DC

## 2020-02-20 NOTE — ED Notes (Signed)
Pt cleaned and placed on purewick. Pt covered with warm blankets

## 2020-02-20 NOTE — ED Notes (Signed)
Date and time results received: 02/20/20 1512 (use smartphrase ".now" to insert current time)  Test: potassium  Critical Value: 2.6  Name of Provider Notified: Carlisle Cater PA  Orders Received? Or Actions Taken?: COVID swab ordered

## 2020-02-20 NOTE — ED Notes (Signed)
Pt cleaned and purewick replaced. Loose and black stool noticed

## 2020-02-20 NOTE — Progress Notes (Signed)
A consult was received from an ED physician for vancomycin and cefepime per pharmacy dosing.  The patient's profile has been reviewed for ht/wt/allergies/indication/available labs.    A one time order has been placed for vancomycin 1gm and zosyn 3.375gm IV x1.  Further antibiotics/pharmacy consults should be ordered by admitting physician if indicated.                       Thank you, Lynelle Doctor 02/20/2020  3:23 PM

## 2020-02-20 NOTE — ED Provider Notes (Signed)
I assumed care of patient at shift change, please see note by previous team for full H&P.  Briefly patient was found on the ground by her niece partway under the bed. Physical Exam  BP (!) 147/102 (BP Location: Right Arm)   Pulse (!) 114   Temp (!) 96.7 F (35.9 C) (Rectal)   Resp (!) 22   Ht 5\' 6"  (1.676 m)   Wt 68 kg   SpO2 100%   BMI 24.21 kg/m   Physical Exam  Patient lying in bed.  She is oriented to person, not to place or time.  5/5 grip strength bilaterally.  Her bilateral hands are cool and slightly cyanotic.  Lungs clear to auscultation bilaterally.  No abdominal tenderness to palpation and no chest wall tenderness to palpation or obvious crepitus on exam.  ED Course/Procedures     .Critical Care Performed by: Lorin Glass, PA-C Authorized by: Lorin Glass, PA-C   Critical care provider statement:    Critical care time (minutes):  45   Critical care time was exclusive of:  Separately billable procedures and treating other patients and teaching time   Critical care was necessary to treat or prevent imminent or life-threatening deterioration of the following conditions:  Metabolic crisis and dehydration   Critical care was time spent personally by me on the following activities:  Discussions with consultants, evaluation of patient's response to treatment, examination of patient, ordering and performing treatments and interventions, ordering and review of laboratory studies, ordering and review of radiographic studies, pulse oximetry, re-evaluation of patient's condition, obtaining history from patient or surrogate and review of old charts     Labs Reviewed  RESP PANEL BY RT-PCR (FLU A&B, COVID) ARPGX2 - Abnormal; Notable for the following components:      Result Value   SARS Coronavirus 2 by RT PCR POSITIVE (*)    All other components within normal limits  CBC WITH DIFFERENTIAL/PLATELET - Abnormal; Notable for the following components:   WBC 23.4 (*)     RBC 5.72 (*)    Hemoglobin 17.2 (*)    HCT 53.9 (*)    Neutro Abs 20.2 (*)    Monocytes Absolute 1.9 (*)    Abs Immature Granulocytes 0.26 (*)    All other components within normal limits  COMPREHENSIVE METABOLIC PANEL - Abnormal; Notable for the following components:   Sodium 146 (*)    Potassium 2.6 (*)    CO2 16 (*)    Glucose, Bld 193 (*)    BUN 63 (*)    Creatinine, Ser 4.61 (*)    Calcium 7.9 (*)    Total Protein 5.7 (*)    Albumin 2.7 (*)    AST 106 (*)    Total Bilirubin 1.6 (*)    GFR, Estimated 9 (*)    Anion gap 19 (*)    All other components within normal limits  CK - Abnormal; Notable for the following components:   Total CK 2,078 (*)    All other components within normal limits  LACTIC ACID, PLASMA - Abnormal; Notable for the following components:   Lactic Acid, Venous 4.4 (*)    All other components within normal limits  APTT - Abnormal; Notable for the following components:   aPTT 21 (*)    All other components within normal limits  LACTIC ACID, PLASMA - Abnormal; Notable for the following components:   Lactic Acid, Venous 4.0 (*)    All other components within normal limits  TROPONIN  I (HIGH SENSITIVITY) - Abnormal; Notable for the following components:   Troponin I (High Sensitivity) 667 (*)    All other components within normal limits  TROPONIN I (HIGH SENSITIVITY) - Abnormal; Notable for the following components:   Troponin I (High Sensitivity) 606 (*)    All other components within normal limits  URINE CULTURE  CULTURE, BLOOD (ROUTINE X 2)  CULTURE, BLOOD (ROUTINE X 2)  PROTIME-INR  URINALYSIS, ROUTINE W REFLEX MICROSCOPIC  MAGNESIUM  LACTIC ACID, PLASMA  POC OCCULT BLOOD, ED  TROPONIN I (HIGH SENSITIVITY)    CT Head Wo Contrast  Result Date: 02/20/2020 CLINICAL DATA:  Found on floor today. Ninety no x1. Does not remember fall. EXAM: CT HEAD WITHOUT CONTRAST CT CERVICAL SPINE WITHOUT CONTRAST TECHNIQUE: Multidetector CT imaging of the head and  cervical spine was performed following the standard protocol without intravenous contrast. Multiplanar CT image reconstructions of the cervical spine were also generated. COMPARISON:  CT head and cervical spine 09/16/2014, chest x-ray 02/20/2020, CT chest 09/29/2012 FINDINGS: CT HEAD FINDINGS Brain: Cerebral ventricle sizes are concordant with the degree of cerebral volume loss. Patchy and confluent areas of decreased attenuation are noted throughout the deep and periventricular white matter of the cerebral hemispheres bilaterally, compatible with chronic microvascular ischemic disease. no evidence of large-territorial acute infarction. No parenchymal hemorrhage. No mass lesion. No extra-axial collection. No mass effect or midline shift. No hydrocephalus. Basilar cisterns are patent. Vascular: No hyperdense vessel. Skull: No acute fracture or focal lesion. Sinuses/Orbits: Paranasal sinuses and mastoid air cells are clear. Bilateral lens replacement. Otherwise the orbits are unremarkable. Other: None. CT CERVICAL SPINE FINDINGS Alignment: Normal. Skull base and vertebrae: Severe multilevel degenerative changes of the spine with osteophyte formation, facet arthropathy, uncovertebral arthropathy, intervertebral disc space narrowing. Osseous neural foraminal narrowing of the right C4-C5 level and bilateral C5-C6 levels. Redemonstration of central canal stenosis at the C4-C5 and C5-C6 levels. No acute fracture. No aggressive appearing focal osseous lesion or focal pathologic process. Soft tissues and spinal canal: No prevertebral fluid or swelling. No visible canal hematoma. Upper chest: Right apical 0.9 x 0.7 cm density. Other: Nuchal ligament calcifications. IMPRESSION: 1. No acute intracranial abnormality. 2. No acute displaced fracture or traumatic listhesis. 3. Right apical lung 0.9 x 0.7 cm density. Finding could represent infection, inflammation, nodule. Electronically Signed   By: Iven Finn M.D.   On:  02/20/2020 15:51   CT Cervical Spine Wo Contrast  Result Date: 02/20/2020 CLINICAL DATA:  Found on floor today. Ninety no x1. Does not remember fall. EXAM: CT HEAD WITHOUT CONTRAST CT CERVICAL SPINE WITHOUT CONTRAST TECHNIQUE: Multidetector CT imaging of the head and cervical spine was performed following the standard protocol without intravenous contrast. Multiplanar CT image reconstructions of the cervical spine were also generated. COMPARISON:  CT head and cervical spine 09/16/2014, chest x-ray 02/20/2020, CT chest 09/29/2012 FINDINGS: CT HEAD FINDINGS Brain: Cerebral ventricle sizes are concordant with the degree of cerebral volume loss. Patchy and confluent areas of decreased attenuation are noted throughout the deep and periventricular white matter of the cerebral hemispheres bilaterally, compatible with chronic microvascular ischemic disease. no evidence of large-territorial acute infarction. No parenchymal hemorrhage. No mass lesion. No extra-axial collection. No mass effect or midline shift. No hydrocephalus. Basilar cisterns are patent. Vascular: No hyperdense vessel. Skull: No acute fracture or focal lesion. Sinuses/Orbits: Paranasal sinuses and mastoid air cells are clear. Bilateral lens replacement. Otherwise the orbits are unremarkable. Other: None. CT CERVICAL SPINE FINDINGS Alignment: Normal. Skull base  and vertebrae: Severe multilevel degenerative changes of the spine with osteophyte formation, facet arthropathy, uncovertebral arthropathy, intervertebral disc space narrowing. Osseous neural foraminal narrowing of the right C4-C5 level and bilateral C5-C6 levels. Redemonstration of central canal stenosis at the C4-C5 and C5-C6 levels. No acute fracture. No aggressive appearing focal osseous lesion or focal pathologic process. Soft tissues and spinal canal: No prevertebral fluid or swelling. No visible canal hematoma. Upper chest: Right apical 0.9 x 0.7 cm density. Other: Nuchal ligament  calcifications. IMPRESSION: 1. No acute intracranial abnormality. 2. No acute displaced fracture or traumatic listhesis. 3. Right apical lung 0.9 x 0.7 cm density. Finding could represent infection, inflammation, nodule. Electronically Signed   By: Iven Finn M.D.   On: 02/20/2020 15:51   DG Chest Port 1 View  Result Date: 02/20/2020 CLINICAL DATA:  Unwitnessed fall EXAM: PORTABLE CHEST 1 VIEW COMPARISON:  04/13/2017 FINDINGS: The heart size and mediastinal contours are within normal limits. Both lungs are clear. The visualized skeletal structures are unremarkable. IMPRESSION: No active disease. Electronically Signed   By: Donavan Foil M.D.   On: 02/20/2020 15:48   DG Knee Complete 4 Views Left  Result Date: 02/20/2020 CLINICAL DATA:  Pain fall EXAM: LEFT KNEE - COMPLETE 4+ VIEW COMPARISON:  08/14/2015 FINDINGS: Slightly limited by positioning. No definitive fracture or malalignment. Moderate severe tricompartment arthritis. No large knee effusion. IMPRESSION: 1. No definite acute osseous abnormality. 2. Moderate severe tricompartment arthritis. Electronically Signed   By: Donavan Foil M.D.   On: 02/20/2020 18:55   DG Hip Unilat W or Wo Pelvis 2-3 Views Left  Result Date: 02/20/2020 CLINICAL DATA:  Unwitnessed fall EXAM: DG HIP (WITH OR WITHOUT PELVIS) 2-3V LEFT COMPARISON:  08/14/2015 FINDINGS: Mild SI joint degenerative changes. Pubic symphysis and rami appear intact. Mild degenerative changes of the bilateral hips. No definite acute displaced fracture or malalignment. IMPRESSION: No definite acute osseous abnormality. Electronically Signed   By: Donavan Foil M.D.   On: 02/20/2020 15:47     MDM  Plan is to follow-up on labs, imaging anticipate admission into the hospital. On my exam patient appears very dry with dry tongue and mucous membranes.  She is tachycardic.  She is also tachypneic however is 100% on room air.  CBC shows leukocytosis at 23.4.  Her hemoglobin is also elevated at  14.2 consistent with her dehydration.  CMP shows significant hypokalemia with a potassium of 2.9.  Hypernatremia with a sodium of 146.  She is acidotic with a CO2 of 16.  She has an AKI as her creatinine is 4.61, 2 years ago it was approximately 1.  She has an anion gap at 19.  Her CK is elevated at 2000.  Initial troponin was elevated at 667, repeat troponin improved with fluids at 606.  CT head and neck did not show intracranial hemorrhage or acute abnormalities.  X-rays of the left hip, pelvis, and knee were obtained without fractures.  CT of neck showed concern for a possible right apical lung density.  Patient's Covid test is positive.  Patient's niece who appears to be her primary caregiver states that both patient and niece are not vaccinated.    I discussed with patient's niece who is at bedside goals of care.  She states that patient would not want to be intubated or put on a ventilator and if her heart were to stop she would not want CPR, that she would "not want anything to prolong suffering" however would want treatable things to  be treated.  Patient is altered and unable to provide her own input at this time.  Patient's urine is still pending.  No clear bacterial source of infection.  Her lactic acid was over 4 however given lack of bacterial infection evidence I suspect that this is more due to dehydration and poor p.o. intake due to being on the ground for extended period of time in the absence of source of bacterial infection.  Therefore code sepsis was not called. She is however given Vanco and Zosyn out of abundance of caution.    Additionally I discussed with patient's niece that she has also been exposed to Covid and needs to quarantine at home for a minimum of 14 days, and discussed notification of patient's close contacts and she states her understanding.   Spoke with Dr. Roel Cluck who will see patient for admission.  Note: Portions of this report may have been transcribed  using voice recognition software. Every effort was made to ensure accuracy; however, inadvertent computerized transcription errors may be present         Patient's niece is the primary contact. She states that both she and patient are unvaccinated. She and I discussed goals of care. If it was to get to that point she feels like patient would not want intubation, ventilator or CPR, however would want treatment up to that point. RN informed.           Lorin Glass, PA-C 02/20/20 Sharilyn Sites    Veryl Speak, MD 02/20/20 2225

## 2020-02-20 NOTE — H&P (Addendum)
Janice [name] TMH:[id number] DOB: 1931-11-23 DOA: 02/20/2020    PCP: Harlan Stains, MD   Outpatient Specialists:   CARDS: Dr.Nahser    Patient arrived to ER on 02/20/20 at 17 Referred by Attending No att. providers found   Patient coming from: home Lives alone,  Chief Complaint:   Chief Complaint  Patient presents with  . Fall    HPI: Janice [name] is a 86 y.o. female with medical history significant of A.fib on Eliquis, Dm2, CKD, HTN, HLD    Presented with  Fall was found down on the floor unclear for how many days  Could be 1-2. Patient appears more confused. Found by her Niece Niece is primary caretaker she went out of town Thursday Have not seen her since Patient has not been taking any of her meds   Infectious risk factors:  Reports severe fatigue   Has   NOt been vaccinated against COVID    Initial COVID TEST    POSITIVE,     Lab Results  Component Value Date   [place] (A) 02/20/2020    Regarding pertinent Chronic problems:     Hyperlipidemia -  on statins simvastatin Lipid Panel     Component Value Date/Time   CHOL 118 10/20/2016 0651   TRIG 92 10/20/2016 0651   HDL 39 (L) 10/20/2016 0651   CHOLHDL 3.0 10/20/2016 0651   VLDL 18 10/20/2016 0651   LDLCALC 61 10/20/2016 0651     HTN on metoprolol   chronic CHF diastolic - last echo March 2019     DM 2 -  Lab Results  Component Value Date   HGBA1C 6.0 (H) 09/17/2014   on PO meds only,metformin     A. Fib -  - CHA2DS2 vas score   5      current  on anticoagulation with  Eliquis,           -  Rate control:  Currently controlled with Metoprolol       CKD stage III - baseline Cr 1.1 Estimated Creatinine Clearance: 8.4 mL/min (A) (by C-G formula based on SCr of 4.61 mg/dL (H)).  Lab Results  Component Value Date   CREATININE 4.61 (H) 02/20/2020   CREATININE 1.09 (H) 04/13/2017   CREATININE 1.12 (H) 10/21/2016     While in ER: Found to be hypothermic, dehydrated  tachycardiac WBC 23.4 Covid Positive Started on VANC AND zOSYN hYPOKALEMIA  k 2.6 aKI 4.61 UP FROM BASELINE OF 1.0 CK 2000   troponin 6667 Lactic acid 4.0 Blood cultures and Urine are pending  Hospitalist was called for admission for sepsis  The following Work up has been ordered so far:  Orders Placed This Encounter  Procedures  . Urine Culture  . Blood culture (routine x 2)  . Resp Panel by RT-PCR (Flu A&B, Covid) Nasopharyngeal Swab  . CT Head Wo Contrast  . CT Cervical Spine Wo Contrast  . DG Chest Port 1 View  . DG Hip Unilat W or Wo Pelvis 2-3 Views Left  . DG Knee Complete 4 Views Left  . CBC with Differential/Platelet  . Comprehensive metabolic panel  . CK  . Urinalysis, Routine w reflex microscopic  . Lactic acid, plasma  . Protime-INR  . APTT  . Magnesium  . Lactic acid, plasma  . Diet NPO time specified  . Check Rectal Temperature  . Cardiac monitoring  . Document height and weight  . Assess and Document Glasgow Coma Scale  .  Document vital signs within 1-hour of fluid bolus completion.  Notify provider of abnormal vital signs despite fluid resuscitation.  . Refer to Sidebar Report: Sepsis Bundle ED/IP  . Notify provider for difficulties obtaining IV access  . Initiate Carrier Fluid Protocol  . Height and weight  . Consult to hospitalist  ALL PATIENTS BEING ADMITTED/HAVING PROCEDURES NEED COVID-19 SCREENING  . Airborne / Contact ([place]) Isolation  . Pulse oximetry, continuous  . POC occult blood, ED  . ED EKG  . EKG 12-Lead  . Insert peripheral IV X 1    Following Medications were ordered in ER: Medications  potassium chloride 10 mEq in 100 mL IVPB (10 mEq Intravenous New Bag/Given 02/20/20 1813)  sodium chloride 0.9 % bolus 500 mL (0 mLs Intravenous Stopped 02/20/20 1559)  sodium chloride 0.9 % bolus 500 mL (0 mLs Intravenous Stopped 02/20/20 1533)  piperacillin-tazobactam (ZOSYN) IVPB 3.375 g (0 g Intravenous Stopped 02/20/20 1641)  vancomycin  (VANCOCIN) IVPB 1000 mg/200 mL premix (0 mg Intravenous Stopped 02/20/20 1814)  lactated ringers bolus 500 mL (500 mLs Intravenous New Bag/Given 02/20/20 1813)        Consult Orders  (From admission, onward)         Start     Ordered   02/20/20 1842  Consult to hospitalist  ALL PATIENTS BEING ADMITTED/HAVING PROCEDURES NEED COVID-19 SCREENING  Once       Comments: ALL PATIENTS BEING ADMITTED/HAVING PROCEDURES NEED COVID-19 SCREENING  Provider:  (Not yet assigned)  Question Answer Comment  Place call to: [facility]   Reason for Consult Admit      02/20/20 1841          Significant initial  Findings: Abnormal Labs Reviewed  RESP PANEL BY RT-PCR (FLU A&B, COVID) ARPGX2 - Abnormal; Notable for the following components:      Result Value   SARS Coronavirus 2 by RT PCR POSITIVE (*)    All other components within normal limits  CBC WITH DIFFERENTIAL/PLATELET - Abnormal; Notable for the following components:   WBC 23.4 (*)    RBC 5.72 (*)    Hemoglobin 17.2 (*)    HCT 53.9 (*)    Neutro Abs 20.2 (*)    Monocytes Absolute 1.9 (*)    Abs Immature Granulocytes 0.26 (*)    All other components within normal limits  COMPREHENSIVE METABOLIC PANEL - Abnormal; Notable for the following components:   Sodium 146 (*)    Potassium 2.6 (*)    CO2 16 (*)    Glucose, Bld 193 (*)    BUN 63 (*)    Creatinine, Ser 4.61 (*)    Calcium 7.9 (*)    Total Protein 5.7 (*)    Albumin 2.7 (*)    AST 106 (*)    Total Bilirubin 1.6 (*)    GFR, Estimated 9 (*)    Anion gap 19 (*)    All other components within normal limits  CK - Abnormal; Notable for the following components:   Total CK 2,078 (*)    All other components within normal limits  LACTIC ACID, PLASMA - Abnormal; Notable for the following components:   Lactic Acid, Venous 4.4 (*)    All other components within normal limits  APTT - Abnormal; Notable for the following components:   aPTT 21 (*)    All other components within  normal limits  LACTIC ACID, PLASMA - Abnormal; Notable for the following components:   Lactic Acid, Venous 4.0 (*)  All other components within normal limits  LACTIC ACID, PLASMA - Abnormal; Notable for the following components:   Lactic Acid, Venous 4.4 (*)    All other components within normal limits  TROPONIN I (HIGH SENSITIVITY) - Abnormal; Notable for the following components:   Troponin I (High Sensitivity) 667 (*)    All other components within normal limits  TROPONIN I (HIGH SENSITIVITY) - Abnormal; Notable for the following components:   Troponin I (High Sensitivity) 606 (*)    All other components within normal limits  TROPONIN I (HIGH SENSITIVITY) - Abnormal; Notable for the following components:   Troponin I (High Sensitivity) 360 (*)    All other components within normal limits    Otherwise labs showing:  Recent Labs  Lab 02/20/20 1355  NA 146*  K 2.6*  CO2 16*  GLUCOSE 193*  BUN 63*  CREATININE 4.61*  CALCIUM 7.9*   Cr   Up from baseline see below Lab Results  Component Value Date   CREATININE 4.61 (H) 02/20/2020   CREATININE 1.09 (H) 04/13/2017   CREATININE 1.12 (H) 10/21/2016    Recent Labs  Lab 02/20/20 1355  AST 106*  ALT 31  ALKPHOS 95  BILITOT 1.6*  PROT 5.7*  ALBUMIN 2.7*   Lab Results  Component Value Date   CALCIUM 7.9 (L) 02/20/2020      WBC      Component Value Date/Time   WBC 23.4 (H) 02/20/2020 1355   LYMPHSABS 1.0 02/20/2020 1355   MONOABS 1.9 (H) 02/20/2020 1355   EOSABS 0.0 02/20/2020 1355   BASOSABS 0.1 02/20/2020 1355   Plt: Lab Results  Component Value Date   PLT 193 02/20/2020   Lactic Acid, Venous    Component Value Date/Time   LATICACIDVEN 4.0 (HH) 02/20/2020 1629   Lactic Acid, Venous    Component Value Date/Time   LATICACIDVEN 4.4 (HH) 02/20/2020 2015    Procalcitonin   Ordered   COVID-19 Labs  No results for input(s): DDIMER, FERRITIN, LDH, CRP in the last 72 hours.  Lab Results  Component Value  Date   SARSCOV2NAA POSITIVE (A) 02/20/2020    HG/HCT Up from baseline see below    Component Value Date/Time   HGB 17.2 (H) 02/20/2020 1355   HCT 53.9 (H) 02/20/2020 1355   MCV 94.2 02/20/2020 1355     Troponin  606 - 667- 360 Cardiac Panel (last 3 results) Recent Labs    02/20/20 1355  CKTOTAL 2,078*    ECG: Ordered Personally reviewed by me showing: HR : 104 Rhythm: A.fib. W RVR,    no evidence of ischemic changes QTC 456    UA ordered    CT HEAD  NON acute  CXR -  NON acute  Left knee negative Pelvis negative  noted to have dark black stool in ER    ED Triage Vitals  Enc Vitals Group     BP 02/20/20 1340 122/79     Pulse Rate 02/20/20 1340 (!) 112     Resp 02/20/20 1340 (!) 26     Temp 02/20/20 1434 (!) 96.7 F (35.9 C)     Temp Source 02/20/20 1434 Rectal     SpO2 02/20/20 1340 100 %     Weight 02/20/20 1532 150 lb (68 kg)     Height 02/20/20 1532 5\' 6"  (1.676 m)     Head Circumference --      Peak Flow --      Pain Score --  Pain Loc --      Pain Edu? --      Excl. in [place]? --   TMAX(24)@      Latest  Blood pressure (!) 142/59, pulse (!) 103, temperature (!) 97.1 F (36.2 C), temperature source Rectal, resp. rate (!) 24, height 5\' 6"  (1.676 m), weight 68 kg, SpO2 100 %.    Review of Systems:    Pertinent positives include:  fatigue,  Constitutional:  No weight loss, night sweats, Fevers, chills, weight loss  HEENT:  No headaches, Difficulty swallowing,Tooth/dental problems,Sore throat,  No sneezing, itching, ear ache, nasal congestion, post nasal drip,  Cardio-vascular:  No chest pain, Orthopnea, PND, anasarca, dizziness, palpitations.no Bilateral lower extremity swelling  GI:  No heartburn, indigestion, abdominal pain, nausea, vomiting, diarrhea, change in bowel habits, loss of appetite, melena, blood in stool, hematemesis Resp:  no shortness of breath at rest. No dyspnea on exertion, No excess mucus, no productive cough, No non-productive  cough, No coughing up of blood.No change in color of mucus.No wheezing. Skin:  no rash or lesions. No jaundice GU:  no dysuria, change in color of urine, no urgency or frequency. No straining to urinate.  No flank pain.  Musculoskeletal:  No joint pain or no joint swelling. No decreased range of motion. No back pain.  Psych:  No change in mood or affect. No depression or anxiety. No memory loss.  Neuro: no localizing neurological complaints, no tingling, no weakness, no double vision, no gait abnormality, no slurred speech, no confusion  All systems reviewed and apart from [place] all are negative  Past Medical History:   Past Medical History:  Diagnosis Date  . Allergic rhinitis   . Cataracts, bilateral   . Chest pain   . CKD (chronic kidney disease), stage III ([place])   . Dementia ([place])   . Diabetes mellitus   . Endometrial cancer ([place]) 2010  . GERD (gastroesophageal reflux disease)   . History of echocardiogram    Echo 3/19: EF 60-65, no RWMA, Gr 1 DD, normal RVSF, PASP 32  . Hyperlipidemia   . Hypertension   . Lymphedema    left leg  . Normal coronary arteries 2011  . Obesity   . Osteoarthritis   . PAF (paroxysmal atrial fibrillation) (HCC)    on Coumadin       Past Surgical History:  Procedure Laterality Date  . APPENDECTOMY    . BACK SURGERY    . CARDIAC CATHETERIZATION  06/2009   REVEALED SMOOTH AND NORMAL CORONARY ARTERIES  . CHOLECYSTECTOMY    . VAGINAL HYSTERECTOMY      Social History:  Ambulatory  walker       reports that she has never smoked. She has never used smokeless tobacco. She reports that she does not drink alcohol and does not use drugs.  Family History:   Family History  Problem Relation Age of Onset  . Lung cancer Sister   . Colon cancer Mother   . Kidney cancer Father   . Colon cancer Sister        x2    Allergies: Allergies  Allergen Reactions  . Demerol [Meperidine] Nausea And Vomiting and Other (See Comments)    And vertigo       Prior to Admission medications   Medication Sig Start Date End Date Taking? Authorizing Provider  donepezil (ARICEPT) 10 MG tablet Take 10 mg by mouth at bedtime. 01/19/17   [provider]  DULoxetine (CYMBALTA) 30 MG capsule Take  30 mg by mouth daily.    [provider]  ELIQUIS 5 MG TABS tablet Take 5 mg by mouth 2 (two) times daily. 10/25/16   [provider]  furosemide (LASIX) 20 MG tablet Take 20 mg by mouth daily as needed for fluid or edema.  10/29/16   [provider]  irbesartan (AVAPRO) 300 MG tablet Take 300 mg by mouth daily.  05/23/13   [provider]  memantine (NAMENDA) 5 MG tablet Take 5 mg by mouth 2 (two) times daily.  12/29/19   [provider]  metoprolol tartrate (LOPRESSOR) 100 MG tablet Take 100 mg by mouth 2 (two) times daily.     [provider]  nitroGLYCERIN (NITROSTAT) 0.4 MG SL tablet Place 0.4 mg under the tongue every 5 (five) minutes as needed for chest pain.     [provider]  omeprazole (PRILOSEC) 20 MG capsule Take 1 capsule (20 mg total) by mouth daily. 10/21/16   Bonnielee Haff, MD  oxyCODONE (ROXICODONE) 5 MG immediate release tablet Take 1 tablet (5 mg total) by mouth every 4 (four) hours as needed for severe pain. 09/17/14   Jessy Oto, MD  polyethylene glycol (MIRALAX / Floria Raveling) packet Take 17 g by mouth daily as needed for mild constipation.     [provider]  simvastatin (ZOCOR) 20 MG tablet Take 20 mg by mouth daily at 6 PM.  12/26/19   [provider]  simvastatin (ZOCOR) 40 MG tablet Take 20 mg by mouth daily at 6 PM.    [provider]  telmisartan (MICARDIS) 20 MG tablet Take 20 mg by mouth daily.  12/26/19   [provider]   Physical Exam: Vitals with BMI 02/20/2020 02/20/2020 02/20/2020  Height - - -  Weight - - -  BMI - - -  Systolic 417 408 144  Diastolic 59 68 818  Pulse 103 103 114    1. General:  in No Acute distress     Chronically ill  -appearing 2. Psychological: somnolent and not Oriented 3. Head/ENT:    Dry Mucous Membranes                          Head Non traumatic, neck supple                           Poor Dentition 4. SKIN:   decreased Skin turgor,  Skin clean Dry and intact no rash 5. Heart: Regular rate and rhythm no  Murmur, no Rub or gallop 6. Lungs:  no wheezes or crackles   7. Abdomen: Soft,  non-tender, Non distended  bowel sounds present 8. Lower extremities: no clubbing, cyanosis, bilateral edema 9. Neurologically Grossly intact, moving all 4 extremities equally  10. MSK: Normal range of motion   All other LABS:    Recent Labs  Lab 02/20/20 1355  WBC 23.4*  NEUTROABS 20.2*  HGB 17.2*  HCT 53.9*  MCV 94.2  PLT 193     Recent Labs  Lab 02/20/20 1355  NA 146*  K 2.6*  CL 111  CO2 16*  GLUCOSE 193*  BUN 63*  CREATININE 4.61*  CALCIUM 7.9*     Recent Labs  Lab 02/20/20 1355  AST 106*  ALT 31  ALKPHOS 95  BILITOT 1.6*  PROT 5.7*  ALBUMIN 2.7*    Cultures: No results found for: SDES, SPECREQUEST, CULT, REPTSTATUS  Radiological Exams on Admission: CT Head Wo Contrast  Result Date: 02/20/2020 CLINICAL DATA:  Found on floor today. Ninety no x1. Does not remember fall. EXAM: CT HEAD WITHOUT CONTRAST CT CERVICAL SPINE WITHOUT CONTRAST TECHNIQUE: Multidetector CT imaging of the head and cervical spine was performed following the standard protocol without intravenous contrast. Multiplanar CT image reconstructions of the cervical spine were also generated. COMPARISON:  CT head and cervical spine 09/16/2014, chest x-ray 02/20/2020, CT chest 09/29/2012 FINDINGS: CT HEAD FINDINGS Brain: Cerebral ventricle sizes are concordant with the degree of cerebral volume loss. Patchy and confluent areas of decreased attenuation are noted throughout the deep and periventricular white matter of the cerebral hemispheres bilaterally, compatible with chronic microvascular ischemic  disease. no evidence of large-territorial acute infarction. No parenchymal hemorrhage. No mass lesion. No extra-axial collection. No mass effect or midline shift. No hydrocephalus. Basilar cisterns are patent. Vascular: No hyperdense vessel. Skull: No acute fracture or focal lesion. Sinuses/Orbits: Paranasal sinuses and mastoid air cells are clear. Bilateral lens replacement. Otherwise the orbits are unremarkable. Other: None. CT CERVICAL SPINE FINDINGS Alignment: Normal. Skull base and vertebrae: Severe multilevel degenerative changes of the spine with osteophyte formation, facet arthropathy, uncovertebral arthropathy, intervertebral disc space narrowing. Osseous neural foraminal narrowing of the right C4-C5 level and bilateral C5-C6 levels. Redemonstration of central canal stenosis at the C4-C5 and C5-C6 levels. No acute fracture. No aggressive appearing focal osseous lesion or focal pathologic process. Soft tissues and spinal canal: No prevertebral fluid or swelling. No visible canal hematoma. Upper chest: Right apical 0.9 x 0.7 cm density. Other: Nuchal ligament calcifications. IMPRESSION: 1. No acute intracranial abnormality. 2. No acute displaced fracture or traumatic listhesis. 3. Right apical lung 0.9 x 0.7 cm density. Finding could represent infection, inflammation, nodule. Electronically Signed   By: Iven Finn M.D.   On: 02/20/2020 15:51   CT Cervical Spine Wo Contrast  Result Date: 02/20/2020 CLINICAL DATA:  Found on floor today. Ninety no x1. Does not remember fall. EXAM: CT HEAD WITHOUT CONTRAST CT CERVICAL SPINE WITHOUT CONTRAST TECHNIQUE: Multidetector CT imaging of the head and cervical spine was performed following the standard protocol without intravenous contrast. Multiplanar CT image reconstructions of the cervical spine were also generated. COMPARISON:  CT head and cervical spine 09/16/2014, chest x-ray 02/20/2020, CT chest 09/29/2012 FINDINGS: CT HEAD FINDINGS Brain: Cerebral  ventricle sizes are concordant with the degree of cerebral volume loss. Patchy and confluent areas of decreased attenuation are noted throughout the deep and periventricular white matter of the cerebral hemispheres bilaterally, compatible with chronic microvascular ischemic disease. no evidence of large-territorial acute infarction. No parenchymal hemorrhage. No mass lesion. No extra-axial collection. No mass effect or midline shift. No hydrocephalus. Basilar cisterns are patent. Vascular: No hyperdense vessel. Skull: No acute fracture or focal lesion. Sinuses/Orbits: Paranasal sinuses and mastoid air cells are clear. Bilateral lens replacement. Otherwise the orbits are unremarkable. Other: None. CT CERVICAL SPINE FINDINGS Alignment: Normal. Skull base and vertebrae: Severe multilevel degenerative changes of the spine with osteophyte formation, facet arthropathy, uncovertebral arthropathy, intervertebral disc space narrowing. Osseous neural foraminal narrowing of the right C4-C5 level and bilateral C5-C6 levels. Redemonstration of central canal stenosis at the C4-C5 and C5-C6 levels. No acute fracture. No aggressive appearing focal osseous lesion or focal pathologic process. Soft tissues and spinal canal: No prevertebral fluid or swelling. No visible canal hematoma. Upper chest: Right apical 0.9 x 0.7 cm density. Other: Nuchal ligament calcifications. IMPRESSION: 1. No acute intracranial abnormality. 2. No acute displaced  fracture or traumatic listhesis. 3. Right apical lung 0.9 x 0.7 cm density. Finding could represent infection, inflammation, nodule. Electronically Signed   By: Iven Finn M.D.   On: 02/20/2020 15:51   DG Chest Port 1 View  Result Date: 02/20/2020 CLINICAL DATA:  Unwitnessed fall EXAM: PORTABLE CHEST 1 VIEW COMPARISON:  04/13/2017 FINDINGS: The heart size and mediastinal contours are within normal limits. Both lungs are clear. The visualized skeletal structures are unremarkable.  IMPRESSION: No active disease. Electronically Signed   By: Donavan Foil M.D.   On: 02/20/2020 15:48   DG Hip Unilat W or Wo Pelvis 2-3 Views Left  Result Date: 02/20/2020 CLINICAL DATA:  Unwitnessed fall EXAM: DG HIP (WITH OR WITHOUT PELVIS) 2-3V LEFT COMPARISON:  08/14/2015 FINDINGS: Mild SI joint degenerative changes. Pubic symphysis and rami appear intact. Mild degenerative changes of the bilateral hips. No definite acute displaced fracture or malalignment. IMPRESSION: No definite acute osseous abnormality. Electronically Signed   By: Donavan Foil M.D.   On: 02/20/2020 15:47    Chart has been reviewed   Assessment/Plan   86 y.o. female with medical history significant of A.fib on Eliquis, Dm2, CKD, HTN, HLD     Admitted for severe dehydration resulting in rhabdomyolysis and Covid infection  Present on Admission: . Rhabdomyolysis -we will continue to rehydrate follow CK in a.m.  Marland Kitchen Hypokalemia -replace and repeat no evidence of significant hypomagnesemia  . Mild cognitive impairment -when able to tolerate would restart home meds  . Hypertension -allow permissive hypertension  . Hyperlipidemia -hold statins while LFTs elevated  . CKD (chronic kidney disease) stage 3, GFR 30-59 ml/min (HCC) -avoid hypotension and nephrotoxic medications  . Atrial fibrillation (HCC) -chronic hold blood pressure meds for now given soft BP hold Eliquis given melena and anemia Hemoglobin likely will drop after IV fluid administration  . AKI (acute kidney injury) ([place]) -in the setting of rhabdo and dehydration will rehydrate obtain urine electrolytes If not significantly improved with IV fluids consider further imaging such as renal ultrasound At this point family is leaning to comfort care would like to avoid aggressive interventions  . Dehydration -will rehydrate allow comfort feeding discussed with family  . Elevated lactic acid level in the setting of severe dehydration Will rehydrate given also  new Covid infection would have to be judicious with IV fluids.  Marland Kitchen SIRS (systemic inflammatory response syndrome) (HCC) patient meeting SIRS criteria.  Tachycardia and increased respiratory rate as well as white blood cell count elevation. Patient was initially given IV antibiotics Later tested positive for Covid At this point no clear evidence of bacterial infection UA still pending Now continue IV antibiotic Zosyn to cover possible respiratory source urine/intra-abdominal etiology. Got 1 dose of vancomycin in ER given renal function will likely remain therapeutic for PROM.  We will obtain MRSa serologies We will try to avoid Zosyn bank combination if possible given already AKI  DM2 -  - Order Sensitive  SSI     -  check TSH and HgA1C  - Hold by mouth medications     . COVID-19 virus infection at this point no evidence of infiltrates on chest x-ray or hypoxia.  Continue to monitor discussed monoclonal antibody infusion with the family at this point they are leaning towards comfort care if patient improves regarding acute renal failure and severe dehydration and has improved prognosis would reconsider Family was uncertain if they would want to start remdesivir or any other medications At this point would hold off  and rediscuss pending how patient progresses  . Melena -noted anemia patient on Eliquis will hold for tonight and continue to monitor CBC family at this point wanted to avoid aggressive interventions but if hemoglobin continues to drop would need to discuss with family need for further work-up and blood transfusion if at some point I would want to consider  Elevated troponin -in the setting of rhabdomyolysis with elevated CK not complaining of any chest pain unclear if patient had any cardiac event At this point family was leaning against aggressive interventions.  Troponin seems to be coming down. No acute EKG changes Would consider echo if patient is stable and family would like  to pursue  Other plan as per orders.  DVT prophylaxis:  scd      Code Status:    Code Status: Prior   DNR/DNI leaning towards comfort care as per  family  Consult to palliative care Overall very poor prognosis I had personally discussed CODE STATUS with  family     Family Communication:   Family   at  Bedside  plan of care was discussed on the phone with niece  Disposition Plan:    likely will need placement for rehabilitation                           Following barriers for discharge:                            Electrolytes corrected                               Anemia stable                                                          white count improving able to transition to PO antibiotics                             Will need to be able to tolerate PO                                                                            Would benefit from PT/OT eval prior to DC  Ordered                                     Palliative care    consulted                      Consults called: none     Admission status:  ED Disposition    ED Disposition Condition [place]: [facility] [[id number]]  Level of Care: Telemetry [5]  Admit to tele based on following criteria: Monitor for Ischemic changes  May admit  patient to [facility] or [facility] if equivalent level of care is available:: No  Covid Evaluation: Confirmed COVID Positive  Diagnosis: Rhabdomyolysis [728.88.ICD-9-CM]  Admitting Physician: Toy Baker [[id number]]  Attending Physician: Toy Baker [[id number]]  Estimated length of stay: 3 - 4 days  Certification:: I certify this patient will need inpatient services for at least 2 midnights          inpatient     I Expect 2 midnight stay secondary to severity of patient's current illness need for inpatient interventions justified by the following:  hemodynamic instability despite optimal treatment (tachycardia   tachypnea  )   Severe lab/radiological/exam abnormalities including:   Covid positive and extensive comorbidities including:  DM2   CKD .   Dementia  . Chronic anticoagulation  That are currently affecting medical management.   I expect  patient to be hospitalized for 2 midnights requiring inpatient medical care.  Patient is at high risk for adverse outcome (such as loss of life or disability) if not treated.  Indication for inpatient stay as follows:  Severe change from baseline regarding mental status Hemodynamic instability despite maximal medical therapy,    inability to maintain oral hydration     Need for IV antibiotics, IV fluids     Level of care    tele  For   24H      Lab Results  Component Value Date   [place] (A) 02/20/2020     Precautions: admitted as  covid positive Airborne and Contact precautions    PPE: Used by the provider:   P100  eye Goggles,  Gloves  gown     Loraina Stauffer 02/21/2020, 12:38 AM    [facility]     after 2 AM please page floor coverage PA If 7AM-7PM, please contact the day team taking care of the patient using [email or web address]   Patient was evaluated in the context of the global COVID-19 pandemic, which necessitated consideration that the patient might be at risk for infection with the SARS-CoV-2 virus that causes COVID-19. Institutional protocols and algorithms that pertain to the evaluation of patients at risk for COVID-19 are in a state of rapid change based on information released by regulatory bodies including the CDC and federal and state organizations. These policies and algorithms were followed during the patient's care.

## 2020-02-20 NOTE — ED Triage Notes (Signed)
Pt last seen on Monday evening at home. Pt lives alone. Found today on the floor today. Pt aox1 does not remember fall. Pt states pain all over. Presents with c-collar from ems

## 2020-02-20 NOTE — ED Notes (Signed)
Date and time results received: 02/20/20 1512 (use smartphrase ".now" to insert current time)  Test: troponin  Critical Value: 667  Name of Provider Notified: Carlisle Cater PA  Orders Received? Or Actions Taken?: no orders at this time

## 2020-02-20 NOTE — ED Provider Notes (Signed)
[facility] DEPT Provider Note   CSN: [id number] Arrival date & time: 02/20/20  1325     History Chief Complaint  Patient presents with  . Fall    Janice [name] is a 86 y.o. female.  Patient with history of chronic kidney disease, dementia, atrial fibrillation on Eliquis, diabetes --presents the emergency department after an unwitnessed fall.  Level 5 caveat due to dementia.  Per EMS report, patient found down on the floor today.  Unknown downtime.  C-collar placed by EMS.  Also placed sheet around pelvis prior to arrival.   Additional history obtained from the patient's niece Tammy.  It is reported that the patient was last seen 2 days ago by landlord.  At that time she seemed to be feeling poorly.  Last seen approximately 3 PM.  Patient was found by niece today with her head and upper body underneath of the bed.  She was more confused than normal.  Patient has not taken her medications over the past few days, possibly up to 5 or 6 days.        Past Medical History:  Diagnosis Date  . Allergic rhinitis   . Cataracts, bilateral   . Chest pain   . CKD (chronic kidney disease), stage III ([place])   . Dementia ([place])   . Diabetes mellitus   . Endometrial cancer ([place]) 2010  . GERD (gastroesophageal reflux disease)   . History of echocardiogram    Echo 3/19: EF 60-65, no RWMA, Gr 1 DD, normal RVSF, PASP 32  . Hyperlipidemia   . Hypertension   . Lymphedema    left leg  . Normal coronary arteries 2011  . Obesity   . Osteoarthritis   . PAF (paroxysmal atrial fibrillation) ([place])    on Coumadin    Patient Active Problem List   Diagnosis Date Noted  . Mild cognitive impairment 07/27/2016  . Chronic pain of left knee 04/27/2016  . Long term current use of anticoagulant therapy 09/27/2014  . CKD (chronic kidney disease) stage 3, GFR 30-59 ml/min ([facility]) 09/20/2014  . Pelvic fracture ([place]) 09/17/2014  . Sacral fracture, closed ([place]) 09/17/2014  .  Chest pain 06/22/2013  . Other dysphagia 07/24/2012  . Atrial fibrillation ([place])   . Diabetes mellitus without complication ([place])   . Hypertension   . Hyperlipidemia     Past Surgical History:  Procedure Laterality Date  . APPENDECTOMY    . BACK SURGERY    . CARDIAC CATHETERIZATION  06/2009   REVEALED SMOOTH AND NORMAL CORONARY ARTERIES  . CHOLECYSTECTOMY    . VAGINAL HYSTERECTOMY       OB History   No obstetric history on file.     Family History  Problem Relation Age of Onset  . Lung cancer Sister   . Colon cancer Mother   . Kidney cancer Father   . Colon cancer Sister        x2    Social History   Tobacco Use  . Smoking status: Never Smoker  . Smokeless tobacco: Never Used  Vaping Use  . Vaping Use: Never used  Substance Use Topics  . Alcohol use: No  . Drug use: No    Home Medications Prior to Admission medications   Medication Sig Start Date End Date Taking? Authorizing Provider  donepezil (ARICEPT) 10 MG tablet Take 10 mg by mouth at bedtime. 01/19/17   [provider]  DULoxetine (CYMBALTA) 30 MG capsule Take 30 mg by mouth daily.  [provider]  ELIQUIS 5 MG TABS tablet Take 5 mg by mouth 2 (two) times daily. 10/25/16   [provider]  furosemide (LASIX) 20 MG tablet Take 20 mg by mouth daily as needed for fluid or edema.  10/29/16   [provider]  irbesartan (AVAPRO) 300 MG tablet Take 300 mg by mouth daily.  05/23/13   [provider]  metoprolol tartrate (LOPRESSOR) 100 MG tablet Take 100 mg by mouth 2 (two) times daily.     [provider]  nitroGLYCERIN (NITROSTAT) 0.4 MG SL tablet Place 0.4 mg under the tongue every 5 (five) minutes as needed for chest pain.     [provider]  omeprazole (PRILOSEC) 20 MG capsule Take 1 capsule (20 mg total) by mouth daily. 10/21/16   Bonnielee Haff, MD  oxyCODONE (ROXICODONE) 5 MG immediate release tablet Take 1 tablet (5 mg total) by mouth every 4  (four) hours as needed for severe pain. 09/17/14   Jessy Oto, MD  polyethylene glycol (MIRALAX / Floria Raveling) packet Take 17 g by mouth daily as needed for mild constipation.     [provider]  simvastatin (ZOCOR) 40 MG tablet Take 20 mg by mouth daily at 6 PM.    [provider]    Allergies    Demerol [meperidine]  Review of Systems   Review of Systems  Unable to perform ROS: Dementia    Physical Exam Updated Vital Signs BP 122/79   Pulse (!) 112   Resp (!) 26   SpO2 100%   Physical Exam Vitals and nursing note reviewed.  Constitutional:      General: She is not in acute distress.    Appearance: She is well-developed.  HENT:     Head: Normocephalic and atraumatic.     Right Ear: External ear normal.     Left Ear: External ear normal.     Nose: Nose normal.  Eyes:     Conjunctiva/sclera: Conjunctivae normal.  Neck:     Comments: Rigid cervical collar present. Cardiovascular:     Rate and Rhythm: Tachycardia present. Rhythm irregular.     Heart sounds: No murmur heard.   Pulmonary:     Effort: No respiratory distress.     Breath sounds: No wheezing, rhonchi or rales.  Abdominal:     Palpations: Abdomen is soft.     Tenderness: There is no abdominal tenderness. There is no guarding or rebound.  Musculoskeletal:     Cervical back: Neck supple.     Right lower leg: No edema.     Left lower leg: No edema.     Comments: I am able to passively range both upper extremities.  Patient winces with abduction of both shoulders, but no blocks to motion.  I am able to passively flex the patient's right hip without much difficulty but she does wince in pain.  Left lower extremity is somewhat externally rotated, does not appear foreshortened.  She seems to have pain with palpation over the left hip.  Also complains of pain in the left lateral ribs.  No ecchymosis.  Skin:    General: Skin is warm and dry.     Findings: No rash.  Neurological:     General: No  focal deficit present.     Mental Status: She is alert. Mental status is at baseline.     Motor: No weakness.  Psychiatric:        Mood and Affect: Mood normal.  ED Results / Procedures / Treatments   Labs (all labs ordered are listed, but only abnormal results are displayed) Labs Reviewed  CBC WITH DIFFERENTIAL/PLATELET - Abnormal; Notable for the following components:      Result Value   WBC 23.4 (*)    RBC 5.72 (*)    Hemoglobin 17.2 (*)    HCT 53.9 (*)    Neutro Abs 20.2 (*)    Monocytes Absolute 1.9 (*)    Abs Immature Granulocytes 0.26 (*)    All other components within normal limits  COMPREHENSIVE METABOLIC PANEL - Abnormal; Notable for the following components:   Sodium 146 (*)    Potassium 2.6 (*)    CO2 16 (*)    Glucose, Bld 193 (*)    BUN 63 (*)    Creatinine, Ser 4.61 (*)    Calcium 7.9 (*)    Total Protein 5.7 (*)    Albumin 2.7 (*)    AST 106 (*)    Total Bilirubin 1.6 (*)    GFR, Estimated 9 (*)    Anion gap 19 (*)    All other components within normal limits  CK - Abnormal; Notable for the following components:   Total CK 2,078 (*)    All other components within normal limits  TROPONIN I (HIGH SENSITIVITY) - Abnormal; Notable for the following components:   Troponin I (High Sensitivity) 667 (*)    All other components within normal limits  URINE CULTURE  CULTURE, BLOOD (ROUTINE X 2)  CULTURE, BLOOD (ROUTINE X 2)  RESP PANEL BY RT-PCR (FLU A&B, COVID) ARPGX2  RESP PANEL BY RT-PCR (FLU A&B, COVID) ARPGX2  URINALYSIS, ROUTINE W REFLEX MICROSCOPIC  LACTIC ACID, PLASMA  PROTIME-INR  APTT  MAGNESIUM    ED ECG REPORT   Date: 02/20/2020  Rate: 104  Rhythm: atrial fibrillation  QRS Axis: normal  Intervals: normal  ST/T Wave abnormalities: nonspecific T wave changes  Conduction Disutrbances:none  Narrative Interpretation:   Old EKG Reviewed: unchanged  I have personally reviewed the EKG tracing and agree with the computerized printout as  noted.  Radiology No results found.  Procedures Procedures (including critical care time)  Medications Ordered in ED Medications  sodium chloride 0.9 % bolus 500 mL (has no administration in time range)  piperacillin-tazobactam (ZOSYN) IVPB 3.375 g (has no administration in time range)  sodium chloride 0.9 % bolus 500 mL (500 mLs Intravenous New Bag/Given 02/20/20 1446)    ED Course  I have reviewed the triage vital signs and the nursing notes.  Pertinent labs & imaging results that were available during my care of the patient were reviewed by me and considered in my medical decision making (see chart for details).  Patient seen and examined. Work-up initiated.   Vital signs reviewed and are as follows: BP 122/79   Pulse (!) 112   Temp (!) 96.7 F (35.9 C) (Rectal)   Resp (!) 26   SpO2 100%   Niece now at bedside --additional history obtained.  Patient seen at bedside with Dr. Roderic Palau.  Warm blankets placed on patient.  Lactate, blood cultures ordered.  Will give empiric abx 2/2 WBC, hypothermia, tachycardia.   3:21 PM Signout to [employer] at shift change.     MDM Rules/Calculators/A&P                          Admit.  Final Clinical Impression(s) / ED Diagnoses Final diagnoses:  Fall, initial encounter  Rx / DC Orders ED Discharge Orders    None       Carlisle Cater, Hershal Coria 02/20/20 1522    Milton Ferguson, MD 02/22/20 1031

## 2020-02-21 ENCOUNTER — Inpatient Hospital Stay (HOSPITAL_COMMUNITY): Payer: Medicare HMO

## 2020-02-21 ENCOUNTER — Other Ambulatory Visit: Payer: Self-pay

## 2020-02-21 DIAGNOSIS — N179 Acute kidney failure, unspecified: Secondary | ICD-10-CM | POA: Diagnosis present

## 2020-02-21 LAB — COMPREHENSIVE METABOLIC PANEL
ALT: 31 U/L (ref 0–44)
ALT: 30 U/L (ref 0–44)
AST: 85 U/L — ABNORMAL HIGH (ref 15–41)
AST: 75 U/L — ABNORMAL HIGH (ref 15–41)
Albumin: 2.6 g/dL — ABNORMAL LOW (ref 3.5–5.0)
Albumin: 2.3 g/dL — ABNORMAL LOW (ref 3.5–5.0)
Alkaline Phosphatase: 86 U/L (ref 38–126)
Alkaline Phosphatase: 82 U/L (ref 38–126)
Anion gap: 16 — ABNORMAL HIGH (ref 5–15)
Anion gap: 15 (ref 5–15)
BUN: 79 mg/dL — ABNORMAL HIGH (ref 8–23)
BUN: 77 mg/dL — ABNORMAL HIGH (ref 8–23)
CO2: 18 mmol/L — ABNORMAL LOW (ref 22–32)
CO2: 17 mmol/L — ABNORMAL LOW (ref 22–32)
Calcium: 8.3 mg/dL — ABNORMAL LOW (ref 8.9–10.3)
Calcium: 8.1 mg/dL — ABNORMAL LOW (ref 8.9–10.3)
Chloride: 111 mmol/L (ref 98–111)
Chloride: 113 mmol/L — ABNORMAL HIGH (ref 98–111)
Creatinine, Ser: 4.88 mg/dL — ABNORMAL HIGH (ref 0.44–1.00)
Creatinine, Ser: 4.51 mg/dL — ABNORMAL HIGH (ref 0.44–1.00)
GFR, Estimated: 8 mL/min — ABNORMAL LOW (ref >60)
GFR, Estimated: 9 mL/min — ABNORMAL LOW (ref >60)
Glucose, Bld: 176 mg/dL — ABNORMAL HIGH (ref 70–99)
Glucose, Bld: 145 mg/dL — ABNORMAL HIGH (ref 70–99)
Potassium: 3.4 mmol/L — ABNORMAL LOW (ref 3.5–5.1)
Potassium: 3.5 mmol/L (ref 3.5–5.1)
Sodium: 145 mmol/L (ref 135–145)
Sodium: 145 mmol/L (ref 135–145)
Total Bilirubin: 1.4 mg/dL — ABNORMAL HIGH (ref 0.3–1.2)
Total Bilirubin: 1.2 mg/dL (ref 0.3–1.2)
Total Protein: 5.5 g/dL — ABNORMAL LOW (ref 6.5–8.1)
Total Protein: 5.3 g/dL — ABNORMAL LOW (ref 6.5–8.1)

## 2020-02-21 LAB — MRSA PCR SCREENING: MRSA by PCR: NEGATIVE

## 2020-02-21 LAB — URINALYSIS, ROUTINE W REFLEX MICROSCOPIC
Bilirubin Urine: NEGATIVE
Glucose, UA: 50 mg/dL — AB
Ketones, ur: NEGATIVE mg/dL
Leukocytes,Ua: NEGATIVE
Nitrite: NEGATIVE
Protein, ur: 100 mg/dL — AB
Specific Gravity, Urine: 1.018 (ref 1.005–1.030)
pH: 5 (ref 5.0–8.0)

## 2020-02-21 LAB — CBG MONITORING, ED
Glucose-Capillary: 172 mg/dL — ABNORMAL HIGH (ref 70–99)
Glucose-Capillary: 159 mg/dL — ABNORMAL HIGH (ref 70–99)
Glucose-Capillary: 155 mg/dL — ABNORMAL HIGH (ref 70–99)
Glucose-Capillary: 143 mg/dL — ABNORMAL HIGH (ref 70–99)
Glucose-Capillary: 166 mg/dL — ABNORMAL HIGH (ref 70–99)

## 2020-02-21 LAB — CBC WITH DIFFERENTIAL/PLATELET
Abs Immature Granulocytes: 0.7 10*3/uL — ABNORMAL HIGH (ref 0.00–0.07)
Basophils Absolute: 0 10*3/uL (ref 0.0–0.1)
Basophils Relative: 0 %
Eosinophils Absolute: 0 10*3/uL (ref 0.0–0.5)
Eosinophils Relative: 0 %
HCT: 47.8 % — ABNORMAL HIGH (ref 36.0–46.0)
Hemoglobin: 15.3 g/dL — ABNORMAL HIGH (ref 12.0–15.0)
Immature Granulocytes: 5 %
Lymphocytes Relative: 6 %
Lymphs Abs: 0.9 10*3/uL (ref 0.7–4.0)
MCH: 30.4 pg (ref 26.0–34.0)
MCHC: 32 g/dL (ref 30.0–36.0)
MCV: 94.8 fL (ref 80.0–100.0)
Monocytes Absolute: 1 10*3/uL (ref 0.1–1.0)
Monocytes Relative: 6 %
Neutro Abs: 12.8 10*3/uL — ABNORMAL HIGH (ref 1.7–7.7)
Neutrophils Relative %: 83 %
Platelets: 123 10*3/uL — ABNORMAL LOW (ref 150–400)
RBC: 5.04 MIL/uL (ref 3.87–5.11)
RDW: 13.2 % (ref 11.5–15.5)
WBC Morphology: INCREASED
WBC: 15.5 10*3/uL — ABNORMAL HIGH (ref 4.0–10.5)
nRBC: 0 % (ref 0.0–0.2)

## 2020-02-21 LAB — PROCALCITONIN: Procalcitonin: 0.88 ng/mL

## 2020-02-21 LAB — LACTIC ACID, PLASMA: Lactic Acid, Venous: 2.9 mmol/L (ref 0.5–1.9)

## 2020-02-21 LAB — C-REACTIVE PROTEIN: CRP: 15.2 mg/dL — ABNORMAL HIGH (ref <1.0)

## 2020-02-21 LAB — FERRITIN: Ferritin: 175 ng/mL (ref 11–307)

## 2020-02-21 LAB — LACTATE DEHYDROGENASE: LDH: 298 U/L — ABNORMAL HIGH (ref 98–192)

## 2020-02-21 LAB — CK: Total CK: 632 U/L — ABNORMAL HIGH (ref 38–234)

## 2020-02-21 LAB — PROTEIN / CREATININE RATIO, URINE
Creatinine, Urine: 122.24 mg/dL
Protein Creatinine Ratio: 1.12 mg/mg{Cre} — ABNORMAL HIGH (ref 0.00–0.15)
Total Protein, Urine: 137 mg/dL

## 2020-02-21 LAB — BRAIN NATRIURETIC PEPTIDE: B Natriuretic Peptide: 270 pg/mL — ABNORMAL HIGH (ref 0.0–100.0)

## 2020-02-21 LAB — POC OCCULT BLOOD, ED: Fecal Occult Bld: POSITIVE — AB

## 2020-02-21 LAB — HEMOGLOBIN A1C
Hgb A1c MFr Bld: 6.6 % — ABNORMAL HIGH (ref 4.8–5.6)
Mean Plasma Glucose: 142.72 mg/dL

## 2020-02-21 LAB — MAGNESIUM: Magnesium: 1.1 mg/dL — ABNORMAL LOW (ref 1.7–2.4)

## 2020-02-21 LAB — GLUCOSE, CAPILLARY: Glucose-Capillary: 155 mg/dL — ABNORMAL HIGH (ref 70–99)

## 2020-02-21 LAB — SODIUM, URINE, RANDOM: Sodium, Ur: 26 mmol/L

## 2020-02-21 MED ORDER — METOPROLOL TARTRATE 5 MG/5ML IV SOLN
2.5000 mg | Freq: Once | INTRAVENOUS | Status: AC
  Administered 2020-02-21: 2.5 mg via INTRAVENOUS
  Filled 2020-02-21: qty 5

## 2020-02-21 MED ORDER — AMIODARONE HCL IN DEXTROSE 360-4.14 MG/200ML-% IV SOLN
60.0000 mg/h | INTRAVENOUS | Status: DC
  Administered 2020-02-21: 60 mg/h via INTRAVENOUS
  Filled 2020-02-21 (×2): qty 200

## 2020-02-21 MED ORDER — ORAL CARE MOUTH RINSE
15.0000 mL | Freq: Two times a day (BID) | OROMUCOSAL | Status: DC
  Administered 2020-02-22 – 2020-02-24 (×4): 15 mL via OROMUCOSAL

## 2020-02-21 MED ORDER — SODIUM BICARBONATE 8.4 % IV SOLN
INTRAVENOUS | Status: DC
  Filled 2020-02-21 (×2): qty 1000

## 2020-02-21 MED ORDER — AMIODARONE LOAD VIA INFUSION
150.0000 mg | Freq: Once | INTRAVENOUS | Status: AC
  Administered 2020-02-21: 150 mg via INTRAVENOUS
  Filled 2020-02-21: qty 83.34

## 2020-02-21 MED ORDER — CHLORHEXIDINE GLUCONATE 0.12 % MT SOLN
15.0000 mL | Freq: Two times a day (BID) | OROMUCOSAL | Status: DC
  Administered 2020-02-22 – 2020-02-24 (×6): 15 mL via OROMUCOSAL
  Filled 2020-02-21 (×4): qty 15

## 2020-02-21 MED ORDER — SODIUM BICARBONATE 8.4 % IV SOLN
INTRAVENOUS | Status: DC
  Filled 2020-02-21 (×5): qty 850

## 2020-02-21 MED ORDER — PANTOPRAZOLE SODIUM 40 MG IV SOLR
40.0000 mg | INTRAVENOUS | Status: DC
  Administered 2020-02-21: 40 mg via INTRAVENOUS
  Filled 2020-02-21 (×2): qty 40

## 2020-02-21 MED ORDER — LACTATED RINGERS IV BOLUS
500.0000 mL | Freq: Once | INTRAVENOUS | Status: AC
  Administered 2020-02-21: 500 mL via INTRAVENOUS

## 2020-02-21 MED ORDER — METOPROLOL TARTRATE 5 MG/5ML IV SOLN
10.0000 mg | Freq: Once | INTRAVENOUS | Status: AC
  Administered 2020-02-21: 10 mg via INTRAVENOUS
  Filled 2020-02-21: qty 10

## 2020-02-21 MED ORDER — ORAL CARE MOUTH RINSE
15.0000 mL | Freq: Two times a day (BID) | OROMUCOSAL | Status: DC
  Administered 2020-02-24: 13:00:00 15 mL via OROMUCOSAL

## 2020-02-21 MED ORDER — SODIUM CHLORIDE 0.9 % IV BOLUS
500.0000 mL | Freq: Once | INTRAVENOUS | Status: AC
  Administered 2020-02-21: 500 mL via INTRAVENOUS

## 2020-02-21 MED ORDER — POTASSIUM CHLORIDE 2 MEQ/ML IV SOLN
INTRAVENOUS | Status: DC
  Filled 2020-02-21: qty 1000

## 2020-02-21 MED ORDER — MAGNESIUM SULFATE 2 GM/50ML IV SOLN
2.0000 g | Freq: Once | INTRAVENOUS | Status: AC
  Administered 2020-02-21: 2 g via INTRAVENOUS
  Filled 2020-02-21: qty 50

## 2020-02-21 MED ORDER — CHLORHEXIDINE GLUCONATE CLOTH 2 % EX PADS
6.0000 | MEDICATED_PAD | Freq: Every day | CUTANEOUS | Status: DC
  Administered 2020-02-22 – 2020-02-24 (×2): 6 via TOPICAL

## 2020-02-21 MED ORDER — PIPERACILLIN-TAZOBACTAM IN DEX 2-0.25 GM/50ML IV SOLN
2.2500 g | Freq: Three times a day (TID) | INTRAVENOUS | Status: DC
  Administered 2020-02-21 – 2020-02-22 (×4): 2.25 g via INTRAVENOUS
  Filled 2020-02-21 (×6): qty 50

## 2020-02-21 MED ORDER — AMIODARONE HCL IN DEXTROSE 360-4.14 MG/200ML-% IV SOLN
30.0000 mg/h | INTRAVENOUS | Status: DC
  Administered 2020-02-21 – 2020-02-22 (×2): 30 mg/h via INTRAVENOUS
  Filled 2020-02-21 (×2): qty 200

## 2020-02-21 MED ORDER — INSULIN ASPART 100 UNIT/ML ~~LOC~~ SOLN
0.0000 [IU] | SUBCUTANEOUS | Status: DC
  Administered 2020-02-21: 2 [IU] via SUBCUTANEOUS
  Administered 2020-02-21: 1 [IU] via SUBCUTANEOUS
  Administered 2020-02-21 (×3): 2 [IU] via SUBCUTANEOUS
  Administered 2020-02-22: 1 [IU] via SUBCUTANEOUS
  Administered 2020-02-22: 2 [IU] via SUBCUTANEOUS
  Administered 2020-02-22: 1 [IU] via SUBCUTANEOUS
  Administered 2020-02-22: 3 [IU] via SUBCUTANEOUS
  Filled 2020-02-21: qty 0.09

## 2020-02-21 NOTE — ED Notes (Signed)
Provider notified to address pt's new-onset tachycardia and afib. Writer awaiting orders.

## 2020-02-21 NOTE — ED Notes (Signed)
Patient originally assigned to bed 1443, but level of care was changed to stepdown. Patient is now awaiting placement for a bed on the stepdown unit. Floor notified of change in patient plan.

## 2020-02-21 NOTE — ED Notes (Signed)
CBG 166 

## 2020-02-21 NOTE — ED Notes (Signed)
Pt bladder scan is 42 ml

## 2020-02-21 NOTE — ED Notes (Signed)
Patient had a small bowel movement Changed pad and brief as well as new purewick Peri care

## 2020-02-21 NOTE — ED Notes (Signed)
Lunch tray delivered.

## 2020-02-21 NOTE — Progress Notes (Signed)
PROGRESS NOTE  Janice [name]  BMW:[id number] DOB: Dec 31, 1931 DOA: 02/20/2020 PCP: Harlan Stains, MD  Brief Narrative: Janice [name] is an 86 y.o. female with a history of PAF, T2DM, stage IIIa CKD, HTN, HLD, and dementia who was brought to the ED confused after being found down for an unknown period of time at home. She was found to have positive SARS-CoV-2 PCR without hypoxia or CXR infiltrates. Also has acute renal failure (Cr 4.66 up from baseline of ~1) with lactic acidosis, rhabdomyolysis (CK 2,078), troponin elevation to 667, multiple metabolic derangements including hypernatremia, hypokalemia, hypomagnesemia, and LFT elevations. She appeared dehydrated with neutrophilic leukocytosis (WBC 23.4k with >20% bands), CRP 15.2, PCT 0.88. She reportedly had a dark black stool which was +FOBT as well. Radiographs of the left hip and knee as well as CT head and CT cervical spine were nonacute  Vancomycin and zosyn were given with IV fluids for sepsis with renal failure, anticoagulation and rate control medications were held due to +FOBT and soft blood pressures. The patient remains confused with slightly improved blood pressure but has now entered into AFib with RVR.   Assessment & Plan: Active Problems:   Atrial fibrillation (HCC)   Diabetes mellitus without complication (HCC)   Hypertension   Hyperlipidemia   CKD (chronic kidney disease) stage 3, GFR 30-59 ml/min (HCC)   Long term current use of anticoagulant therapy   Mild cognitive impairment   Rhabdomyolysis   Hypokalemia   AKI (acute kidney injury) (HCC)   Dehydration   Elevated lactic acid level   SIRS (systemic inflammatory response syndrome) ([place])   COVID-19 virus infection   Melena  Goals of care: Patient has a very high risk of inpatient mortality and has voiced the desire to be DNR and avoid aggressive interventions.  - Palliative care consult appreciated.   Sepsis with lactic acidosis:  - Continue zosyn. With unclear  source, negative MRSA swab and renal failure will not continue vancomycin at this time.  - Monitor blood cultures and urine culture.  - Continue IV fluids, repeat LR bolus now.   Acute renal failure on stage IIIa CKD: Multifactorial due to rhabdomyolysis, prerenal azotemia/dehydration, sepsis. Not yet improved with IVF and treating infection.  - Monitor UOP, monitor volume status with last echo showing G1DD, LVEF 60-65%, normal RV.  - Continue IVF - Renal U/S - Hold lasix/ARB. - Some proteinuria and hematuria on urinalysis. Check FENa. Per family, pt's goals of care would not include aggressive interventions which would include HD/CRRT.   PAF with RVR: RVR worsened when beta blocker held.  - Restart very low dose metoprolol IV with soft blood pressures. Restart po metoprolol if possible. - Holding eliquis as below with +FOBT  Rhabdomyolysis, unwitnessed fall: CK improving with IVF.  - Will treat aggressively at this time all related conditions. If clinically improving, would then order PT/OT.   Demand myocardial ischemia: Most recent catheterization showed no obstruction, pt denies chest pain and has no ischemic changes on ECG. Suspect this is related to sepsis complicated by renal failure. Troponins trending downward.  - Restart beta blocker when able.   Positive FOBT: BUN elevated though this could be due entirely to renal failure. No anemia. No ongoing bleeding noted. - Continue to hold anticoagulation/antiplatelet - Will defer any further work up at this time based on goals of care discussions.  - Give PPI IV  Covid-19 infection: Perhaps triggered weakness which was inciting factor. No infiltrates on CXR or hypoxia, would not  qualify currently for steroids and risk of renal impairment > potential benefits of remdesivir at this time. Family has declined monoclonal antibody treatment as well.   Dementia: Pt is on aricept and namenda.  - Discuss care with HCPOA.   T2DM: HbA1c 6.6%.   - SSI  Hypokalemia: Improved.  - Repeat supplementation  Hypomagnesemia:  - Repeat supplementation  LFT elevation: Suspect primarily due to sepsis and/or covid and rhabdomyolysis. No abd tenderness.  - Monitor  - Hold statin  DVT prophylaxis: SCDs Code Status: DNR Family Communication: None at bedside, will contact [facility] shortly Disposition Plan:  Status is: Inpatient  Remains inpatient appropriate because:Hemodynamically unstable, Persistent severe electrolyte disturbances, Altered mental status, Ongoing diagnostic testing needed not appropriate for outpatient work up and IV treatments appropriate due to intensity of illness or inability to take PO  Dispo: The patient is from: Home              Anticipated d/c is to: TBD              Anticipated d/c date is: > 3 days              Patient currently is not medically stable to d/c.  Consultants:   Palliative care  Procedures:   None  Antimicrobials:  Vancomycin 12/8  Zosyn 12/8 >>>   Subjective: Pt confused, having higher HR today but no symptoms with it. Denies chest pain or dyspnea.   Objective: Vitals:   02/21/20 1230 02/21/20 1245 02/21/20 1300 02/21/20 1315  BP:  (!) 109/58 (!) 140/58 (!) 146/132  Pulse: 99 95 75 (!) 168  Resp: (!) 23 (!) 23 (!) 25 (!) 26  Temp:  97.7 F (36.5 C)    TempSrc:  Axillary    SpO2: 96% 96% 97% 95%  Weight:      Height:        Intake/Output Summary (Last 24 hours) at 02/21/2020 1324 Last data filed at 02/21/2020 1045 Gross per 24 hour  Intake 1303 ml  Output --  Net 1303 ml   Filed Weights   02/20/20 1532  Weight: 68 kg    Gen: Frail elderly female in no acute distress Pulm: Non-labored breathing. Clear to auscultation bilaterally.  CV: Rapid irregular. No murmur, rub, or gallop. No JVD, 1+ pitting pedal edema. GI: Abdomen soft, non-tender, non-distended, with normoactive bowel sounds. No organomegaly or masses felt. Ext: Warm, no deformities. +varicosities  bilateral legs. Skin: No rashes, lesions or ulcers Neuro: Alert and disoriented. Moves all extremities. Unable to lift herself up from bed. Psych: Judgement and insight appear impaired.  Data Reviewed: I have personally reviewed following labs and imaging studies  CBC: Recent Labs  Lab 02/20/20 1355 02/21/20 0555  WBC 23.4* 15.5*  NEUTROABS 20.2* 12.8*  HGB 17.2* 15.3*  HCT 53.9* 47.8*  MCV 94.2 94.8  PLT 193 606*   Basic Metabolic Panel: Recent Labs  Lab 02/20/20 1355 02/20/20 1724 02/21/20 0555 02/21/20 1046  NA 146*  --   --  145  K 2.6*  --   --  3.4*  CL 111  --   --  111  CO2 16*  --   --  18*  GLUCOSE 193*  --   --  176*  BUN 63*  --   --  79*  CREATININE 4.61*  --   --  4.88*  CALCIUM 7.9*  --   --  8.3*  MG  --  1.8 1.1*  --  GFR: Estimated Creatinine Clearance: 7.9 mL/min (A) (by C-G formula based on SCr of 4.88 mg/dL (H)). Liver Function Tests: Recent Labs  Lab 02/20/20 1355 02/21/20 1046  AST 106* 85*  ALT 31 31  ALKPHOS 95 86  BILITOT 1.6* 1.4*  PROT 5.7* 5.5*  ALBUMIN 2.7* 2.6*   No results for input(s): LIPASE, AMYLASE in the last 168 hours. No results for input(s): AMMONIA in the last 168 hours. Coagulation Profile: Recent Labs  Lab 02/20/20 1455  INR 1.2   Cardiac Enzymes: Recent Labs  Lab 02/20/20 1355 02/21/20 0555  CKTOTAL 2,078* 632*   BNP (last 3 results) No results for input(s): PROBNP in the last 8760 hours. HbA1C: Recent Labs    02/20/20 1355  HGBA1C 6.6*   CBG: Recent Labs  Lab 02/21/20 0543 02/21/20 0856 02/21/20 1238  GLUCAP 172* 159* 155*   Lipid Profile: No results for input(s): CHOL, HDL, LDLCALC, TRIG, CHOLHDL, LDLDIRECT in the last 72 hours. Thyroid Function Tests: No results for input(s): TSH, T4TOTAL, FREET4, T3FREE, THYROIDAB in the last 72 hours. Anemia Panel: Recent Labs    02/21/20 0555  FERRITIN 175   Urine analysis:    Component Value Date/Time   COLORURINE AMBER (A) 02/20/2020 1355    APPEARANCEUR CLOUDY (A) 02/20/2020 1355   LABSPEC 1.018 02/20/2020 1355   PHURINE 5.0 02/20/2020 1355   GLUCOSEU 50 (A) 02/20/2020 1355   HGBUR LARGE (A) 02/20/2020 1355   BILIRUBINUR NEGATIVE 02/20/2020 1355   KETONESUR NEGATIVE 02/20/2020 1355   PROTEINUR 100 (A) 02/20/2020 1355   UROBILINOGEN 0.2 08/14/2009 1507   NITRITE NEGATIVE 02/20/2020 1355   LEUKOCYTESUR NEGATIVE 02/20/2020 1355   Recent Results (from the past 240 hour(s))  Blood culture (routine x 2)     Status: None (Preliminary result)   Collection Time: 02/20/20  3:45 PM   Specimen: BLOOD RIGHT FOREARM  Result Value Ref Range Status   Specimen Description   Final    BLOOD RIGHT FOREARM Performed at [facility] [address], [place], [place] [address]    Special Requests   Final    BOTTLES DRAWN AEROBIC AND ANAEROBIC Blood Culture adequate volume Performed at [facility] [address], [place], [place] [address]    Culture   Final    NO GROWTH < 24 HOURS Performed at [facility] [address], [place], [place] [address]    Report Status PENDING  Incomplete  Blood culture (routine x 2)     Status: None (Preliminary result)   Collection Time: 02/20/20  3:45 PM   Specimen: BLOOD  Result Value Ref Range Status   Specimen Description   Final    BLOOD LEFT ANTECUBITAL Performed at [facility] [address] [place], [place] [address]    Special Requests   Final    BOTTLES DRAWN AEROBIC AND ANAEROBIC Blood Culture adequate volume Performed at [facility] [address], [place], [place] [address]    Culture   Final    NO GROWTH < 24 HOURS Performed at [facility] [address] [place], [place] [address]    Report Status PENDING  Incomplete  Resp Panel by RT-PCR (Flu A&B, Covid) Nasopharyngeal Swab     Status: Abnormal   Collection Time: 02/20/20  4:30 PM   Specimen: Nasopharyngeal Swab;  Nasopharyngeal(NP) swabs in vial transport medium  Result Value Ref Range Status   SARS Coronavirus 2 by RT PCR POSITIVE (A)  NEGATIVE Final    Comment: RESULT CALLED TO, READ BACK BY AND VERIFIED WITH: APONTE,R. RN @1800  02/20/20 BILLINGSLEY,L (NOTE) SARS-CoV-2 target nucleic acids are DETECTED.  The SARS-CoV-2 RNA is generally detectable in upper respiratory specimens during the acute phase of infection. Positive results are indicative of the presence of the identified virus, but do not rule out bacterial infection or co-infection with other pathogens not detected by the test. Clinical correlation with patient history and other diagnostic information is necessary to determine patient infection status. The expected result is Negative.  Fact Sheet for Patients: EntrepreneurPulse.com.au  Fact Sheet for Healthcare Providers: IncredibleEmployment.be  This test is not yet approved or cleared by the [place] FDA and  has been authorized for detection and/or diagnosis of SARS-CoV-2 by FDA under an Emergency Use Authorization (EUA).  This EUA will remain in effect (meaning this test  can be used) for the duration of  the COVID-19 declaration under Section 564(b)(1) of the Act, 21 U.S.C. section 360bbb-3(b)(1), unless the authorization is terminated or revoked sooner.     Influenza A by PCR NEGATIVE NEGATIVE Final   Influenza B by PCR NEGATIVE NEGATIVE Final    Comment: (NOTE) The Xpert Xpress SARS-CoV-2/FLU/RSV plus assay is intended as an aid in the diagnosis of influenza from Nasopharyngeal swab specimens and should not be used as a sole basis for treatment. Nasal washings and aspirates are unacceptable for Xpert Xpress SARS-CoV-2/FLU/RSV testing.  Fact Sheet for Patients: EntrepreneurPulse.com.au  Fact Sheet for Healthcare Providers: IncredibleEmployment.be  This test is not yet approved or cleared by  the [place] FDA and has been authorized for detection and/or diagnosis of SARS-CoV-2 by FDA under an Emergency Use Authorization (EUA). This EUA will remain in effect (meaning this test can be used) for the duration of the COVID-19 declaration under Section 564(b)(1) of the Act, 21 U.S.C. section 360bbb-3(b)(1), unless the authorization is terminated or revoked.  Performed at [facility], [place] [address], [place], [place] [address]   MRSA PCR Screening     Status: None   Collection Time: 02/21/20  2:00 AM   Specimen: Nasal Mucosa; Nasopharyngeal  Result Value Ref Range Status   MRSA by PCR NEGATIVE NEGATIVE Final    Comment:        The GeneXpert MRSA Assay (FDA approved for NASAL specimens only), is one component of a comprehensive MRSA colonization surveillance program. It is not intended to diagnose MRSA infection nor to guide or monitor treatment for MRSA infections. Performed at [facility], [place] [address] [place], [place] [address]       Radiology Studies: CT Head Wo Contrast  Result Date: 02/20/2020 CLINICAL DATA:  Found on floor today. Ninety no x1. Does not remember fall. EXAM: CT HEAD WITHOUT CONTRAST CT CERVICAL SPINE WITHOUT CONTRAST TECHNIQUE: Multidetector CT imaging of the head and cervical spine was performed following the standard protocol without intravenous contrast. Multiplanar CT image reconstructions of the cervical spine were also generated. COMPARISON:  CT head and cervical spine 09/16/2014, chest x-ray 02/20/2020, CT chest 09/29/2012 FINDINGS: CT HEAD FINDINGS Brain: Cerebral ventricle sizes are concordant with the degree of cerebral volume loss. Patchy and confluent areas of decreased attenuation are noted throughout the deep and periventricular white matter of the cerebral hemispheres bilaterally, compatible with chronic microvascular ischemic disease. no evidence of large-territorial acute infarction. No  parenchymal hemorrhage. No mass lesion. No extra-axial collection. No mass effect or midline shift. No hydrocephalus. Basilar cisterns are patent. Vascular: No  hyperdense vessel. Skull: No acute fracture or focal lesion. Sinuses/Orbits: Paranasal sinuses and mastoid air cells are clear. Bilateral lens replacement. Otherwise the orbits are unremarkable. Other: None. CT CERVICAL SPINE FINDINGS Alignment: Normal. Skull base and vertebrae: Severe multilevel degenerative changes of the spine with osteophyte formation, facet arthropathy, uncovertebral arthropathy, intervertebral disc space narrowing. Osseous neural foraminal narrowing of the right C4-C5 level and bilateral C5-C6 levels. Redemonstration of central canal stenosis at the C4-C5 and C5-C6 levels. No acute fracture. No aggressive appearing focal osseous lesion or focal pathologic process. Soft tissues and spinal canal: No prevertebral fluid or swelling. No visible canal hematoma. Upper chest: Right apical 0.9 x 0.7 cm density. Other: Nuchal ligament calcifications. IMPRESSION: 1. No acute intracranial abnormality. 2. No acute displaced fracture or traumatic listhesis. 3. Right apical lung 0.9 x 0.7 cm density. Finding could represent infection, inflammation, nodule. Electronically Signed   By: Iven Finn M.D.   On: 02/20/2020 15:51   CT Cervical Spine Wo Contrast  Result Date: 02/20/2020 CLINICAL DATA:  Found on floor today. Ninety no x1. Does not remember fall. EXAM: CT HEAD WITHOUT CONTRAST CT CERVICAL SPINE WITHOUT CONTRAST TECHNIQUE: Multidetector CT imaging of the head and cervical spine was performed following the standard protocol without intravenous contrast. Multiplanar CT image reconstructions of the cervical spine were also generated. COMPARISON:  CT head and cervical spine 09/16/2014, chest x-ray 02/20/2020, CT chest 09/29/2012 FINDINGS: CT HEAD FINDINGS Brain: Cerebral ventricle sizes are concordant with the degree of cerebral volume loss.  Patchy and confluent areas of decreased attenuation are noted throughout the deep and periventricular white matter of the cerebral hemispheres bilaterally, compatible with chronic microvascular ischemic disease. no evidence of large-territorial acute infarction. No parenchymal hemorrhage. No mass lesion. No extra-axial collection. No mass effect or midline shift. No hydrocephalus. Basilar cisterns are patent. Vascular: No hyperdense vessel. Skull: No acute fracture or focal lesion. Sinuses/Orbits: Paranasal sinuses and mastoid air cells are clear. Bilateral lens replacement. Otherwise the orbits are unremarkable. Other: None. CT CERVICAL SPINE FINDINGS Alignment: Normal. Skull base and vertebrae: Severe multilevel degenerative changes of the spine with osteophyte formation, facet arthropathy, uncovertebral arthropathy, intervertebral disc space narrowing. Osseous neural foraminal narrowing of the right C4-C5 level and bilateral C5-C6 levels. Redemonstration of central canal stenosis at the C4-C5 and C5-C6 levels. No acute fracture. No aggressive appearing focal osseous lesion or focal pathologic process. Soft tissues and spinal canal: No prevertebral fluid or swelling. No visible canal hematoma. Upper chest: Right apical 0.9 x 0.7 cm density. Other: Nuchal ligament calcifications. IMPRESSION: 1. No acute intracranial abnormality. 2. No acute displaced fracture or traumatic listhesis. 3. Right apical lung 0.9 x 0.7 cm density. Finding could represent infection, inflammation, nodule. Electronically Signed   By: Iven Finn M.D.   On: 02/20/2020 15:51   DG Chest Port 1 View  Result Date: 02/20/2020 CLINICAL DATA:  Unwitnessed fall EXAM: PORTABLE CHEST 1 VIEW COMPARISON:  04/13/2017 FINDINGS: The heart size and mediastinal contours are within normal limits. Both lungs are clear. The visualized skeletal structures are unremarkable. IMPRESSION: No active disease. Electronically Signed   By: Donavan Foil M.D.    On: 02/20/2020 15:48   DG Knee Complete 4 Views Left  Result Date: 02/20/2020 CLINICAL DATA:  Pain fall EXAM: LEFT KNEE - COMPLETE 4+ VIEW COMPARISON:  08/14/2015 FINDINGS: Slightly limited by positioning. No definitive fracture or malalignment. Moderate severe tricompartment arthritis. No large knee effusion. IMPRESSION: 1. No definite acute osseous abnormality. 2. Moderate severe tricompartment arthritis.  Electronically Signed   By: Donavan Foil M.D.   On: 02/20/2020 18:55   DG Hip Unilat W or Wo Pelvis 2-3 Views Left  Result Date: 02/20/2020 CLINICAL DATA:  Unwitnessed fall EXAM: DG HIP (WITH OR WITHOUT PELVIS) 2-3V LEFT COMPARISON:  08/14/2015 FINDINGS: Mild SI joint degenerative changes. Pubic symphysis and rami appear intact. Mild degenerative changes of the bilateral hips. No definite acute displaced fracture or malalignment. IMPRESSION: No definite acute osseous abnormality. Electronically Signed   By: Donavan Foil M.D.   On: 02/20/2020 15:47    Scheduled Meds: . donepezil  10 mg Oral QHS  . insulin aspart  0-9 Units Subcutaneous Q4H  . memantine  5 mg Oral BID  . metoprolol tartrate  2.5 mg Intravenous Once  . sodium chloride flush  3 mL Intravenous Q12H   Continuous Infusions: . lactated ringers    . magnesium sulfate bolus IVPB    . piperacillin-tazobactam (ZOSYN)  IV Stopped (02/21/20 1027)     LOS: 1 day   Time spent: 35 minutes.  Patrecia Pour, MD [facility] [email or web address] 02/21/2020, 1:24 PM

## 2020-02-21 NOTE — ED Notes (Signed)
Patient received dinner tray Observed patient not chewing food Patient ate 100% of her apple sauce  3 spoons of tomato soup  Small sips of tea Observed patient did not sip straw well Washed patients face

## 2020-02-21 NOTE — Progress Notes (Addendum)
INTERIM PROGRESS NOTE Subjective: Patient seen and examined again a couple times this afternoon. She seems a bit more alert than this AM, though remains completed disoriented and unable to hold any conversation or follow most commands. This is not her baseline per her Niece who is her only caretaker/family. No bleeding/melena today.   Objective: BP 113/62   Pulse (!) 56   Temp 97.7 F (36.5 C) (Axillary)   Resp (!) 30   Ht 5\' 6"  (1.676 m)   Wt 68 kg   SpO2 97%   BMI 24.21 kg/m   Gen: Elderly, frail female eating applesuace with assistance Pulm: Nonlabored, tachypneic with scant crackles at bases. No hypoxia when good pleth on monitor.   CV: Irreg irreg, rate is in 90's, no murmur, no JVD, stable 1+ symmetric LE edema GI: Soft, NT, ND, +BS  Neuro: Alert and confused, tracks and opens mouth/swallows.   <5 cc dark brown urine in canister. Renal U/S without obstruction, unremarkable.  Assessment & Plan: This is an 86yo F admitted for acute renal failure in the setting of dehydration, nontraumatic rhabdomyolysis complicated by AFib with RVR and demand myocardial ischemia/troponin leak. Covid-19 infection may have been precipitating event causing initial weakness or is currently incidental.  - Rate is better controlled after metoprolol and amiodarone load and with continued IV fluids, BP has improved. Will continue amiodarone gtt and above maintenance IVF.  - Repeat PM labs reveal improvement in lactic acid level despite ongoing poor renal function. Creatinine has stabilized, though it is unclear at this time whether we will see true renal recovery. UOP has been essentially nil since this morning. Can bladder scan if no UOP by end of this shift with I/O if >300cc retained.  - Acidosis ongoing and IVF changed to sodium bicarbonate. Pt is fortunately taking some po this evening with assistance. - Continue empiric zosyn, labs ordered for the AM. - I have spoken with the patient's medical  decision maker a couple times through the day. I conveyed my suspicion that Ms. Kaminsky' prognosis remains most likely very poor and confirmed that dialysis is not within the patient's goals of care. We will also not escalate to pressors. Pt is certainly DNR. If the patient's clinical status worsens at any time, the Niece would like to visit with the understanding that the patient would transition to full comfort measures only.   Total critical care time: 40 minutes  Due to a high probability of clinically significant, life threatening deterioration, the patient required my highest level of preparedness to intervene emergently and I personally spent this critical care time directly and personally managing the patient. This critical care time included obtaining a history; examining the patient; pulse oximetry; ordering and review of studies; arranging urgent treatment with development of a management plan; evaluation of patient's response to treatment; frequent reassessment; and, discussions with other providers. This critical care time was performed to assess and manage the high probability of imminent, life-threatening deterioration that could result in multi-organ failure. It was exclusive of separately billable procedures and treating other patients and teaching time.  Patrecia Pour, MD Pager on amion 02/21/2020, 5:34 PM

## 2020-02-21 NOTE — Plan of Care (Signed)

## 2020-02-21 NOTE — ED Notes (Signed)
Patient received breakfast tray 

## 2020-02-21 NOTE — Progress Notes (Signed)
Pharmacy Antibiotic Note  Janice [name] is a 86 y.o. female admitted on 02/20/2020 with sepsis.  Pharmacy has been consulted for Zosyn dosing.  Patient with Stage III CKD (baseline Scr ~1.1) currently with AKI due to dehydration.  CrCl<10 ml/min.    Plan: Zosyn 2.25gm IV q8h Monitor renal function and cx data   Height: 5\' 6"  (167.6 cm) Weight: 68 kg (150 lb) IBW/kg (Calculated) : 59.3  Temp (24hrs), Avg:96.9 F (36.1 C), Min:96.7 F (35.9 C), Max:97.1 F (36.2 C)  Recent Labs  Lab 02/20/20 1355 02/20/20 1454 02/20/20 1629 02/20/20 2015  WBC 23.4*  --   --   --   CREATININE 4.61*  --   --   --   LATICACIDVEN  --  4.4* 4.0* 4.4*    Estimated Creatinine Clearance: 8.4 mL/min (A) (by C-G formula based on SCr of 4.61 mg/dL (H)).    Allergies  Allergen Reactions  . Demerol [Meperidine] Nausea And Vomiting and Other (See Comments)    And vertigo    Antimicrobials this admission: 12/8 Zosyn >>  12/8 Vanc x1 >>   Dose adjustments this admission:  Microbiology results: 12/8 BCx:  12/8 UCx:   12/8 Resp PCR + COVID  Thank you for allowing pharmacy to be a part of this patient's care.  Netta Cedars PharmD 02/21/2020 12:53 AM

## 2020-02-22 DIAGNOSIS — Z7189 Other specified counseling: Secondary | ICD-10-CM

## 2020-02-22 LAB — CBC WITH DIFFERENTIAL/PLATELET
Abs Immature Granulocytes: 0.4 10*3/uL — ABNORMAL HIGH (ref 0.00–0.07)
Basophils Absolute: 0 10*3/uL (ref 0.0–0.1)
Basophils Relative: 0 %
Eosinophils Absolute: 0 10*3/uL (ref 0.0–0.5)
Eosinophils Relative: 0 %
HCT: 44.5 % (ref 36.0–46.0)
Hemoglobin: 14.4 g/dL (ref 12.0–15.0)
Immature Granulocytes: 3 %
Lymphocytes Relative: 4 %
Lymphs Abs: 0.6 10*3/uL — ABNORMAL LOW (ref 0.7–4.0)
MCH: 30.3 pg (ref 26.0–34.0)
MCHC: 32.4 g/dL (ref 30.0–36.0)
MCV: 93.7 fL (ref 80.0–100.0)
Monocytes Absolute: 0.7 10*3/uL (ref 0.1–1.0)
Monocytes Relative: 4 %
Neutro Abs: 14.1 10*3/uL — ABNORMAL HIGH (ref 1.7–7.7)
Neutrophils Relative %: 89 %
Platelets: 123 10*3/uL — ABNORMAL LOW (ref 150–400)
RBC: 4.75 MIL/uL (ref 3.87–5.11)
RDW: 13.2 % (ref 11.5–15.5)
WBC Morphology: INCREASED
WBC: 15.7 10*3/uL — ABNORMAL HIGH (ref 4.0–10.5)
nRBC: 0 % (ref 0.0–0.2)

## 2020-02-22 LAB — URINE CULTURE: Culture: NO GROWTH

## 2020-02-22 LAB — MAGNESIUM: Magnesium: 2.4 mg/dL (ref 1.7–2.4)

## 2020-02-22 LAB — LACTIC ACID, PLASMA: Lactic Acid, Venous: 3.4 mmol/L (ref 0.5–1.9)

## 2020-02-22 LAB — COMPREHENSIVE METABOLIC PANEL
ALT: 32 U/L (ref 0–44)
AST: 93 U/L — ABNORMAL HIGH (ref 15–41)
Albumin: 2.1 g/dL — ABNORMAL LOW (ref 3.5–5.0)
Alkaline Phosphatase: 77 U/L (ref 38–126)
Anion gap: 20 — ABNORMAL HIGH (ref 5–15)
BUN: 74 mg/dL — ABNORMAL HIGH (ref 8–23)
CO2: 17 mmol/L — ABNORMAL LOW (ref 22–32)
Calcium: 7.4 mg/dL — ABNORMAL LOW (ref 8.9–10.3)
Chloride: 102 mmol/L (ref 98–111)
Creatinine, Ser: 4.31 mg/dL — ABNORMAL HIGH (ref 0.44–1.00)
GFR, Estimated: 10 mL/min — ABNORMAL LOW (ref >60)
Glucose, Bld: 368 mg/dL — ABNORMAL HIGH (ref 70–99)
Potassium: 3.7 mmol/L (ref 3.5–5.1)
Sodium: 139 mmol/L (ref 135–145)
Total Bilirubin: 1.8 mg/dL — ABNORMAL HIGH (ref 0.3–1.2)
Total Protein: 4.9 g/dL — ABNORMAL LOW (ref 6.5–8.1)

## 2020-02-22 LAB — GLUCOSE, CAPILLARY
Glucose-Capillary: 141 mg/dL — ABNORMAL HIGH (ref 70–99)
Glucose-Capillary: 374 mg/dL — ABNORMAL HIGH (ref 70–99)
Glucose-Capillary: 142 mg/dL — ABNORMAL HIGH (ref 70–99)
Glucose-Capillary: 208 mg/dL — ABNORMAL HIGH (ref 70–99)

## 2020-02-22 LAB — C-REACTIVE PROTEIN: CRP: 16.8 mg/dL — ABNORMAL HIGH (ref <1.0)

## 2020-02-22 LAB — FERRITIN: Ferritin: 126 ng/mL (ref 11–307)

## 2020-02-22 LAB — D-DIMER, QUANTITATIVE: D-Dimer, Quant: 5.48 ug/mL-FEU — ABNORMAL HIGH (ref 0.00–0.50)

## 2020-02-22 MED ORDER — ACETAMINOPHEN 650 MG RE SUPP: 650.0000 mg | Freq: Four times a day (QID) | RECTAL | Status: DC | PRN

## 2020-02-22 MED ORDER — HALOPERIDOL LACTATE 2 MG/ML PO CONC
0.5000 mg | ORAL | Status: DC | PRN
  Filled 2020-02-22: qty 0.3

## 2020-02-22 MED ORDER — METOPROLOL TARTRATE 25 MG PO TABS: 50.0000 mg | ORAL_TABLET | Freq: Two times a day (BID) | ORAL | Status: DC

## 2020-02-22 MED ORDER — BIOTENE DRY MOUTH MT LIQD: 15.0000 mL | OROMUCOSAL | Status: DC | PRN

## 2020-02-22 MED ORDER — HALOPERIDOL 0.5 MG PO TABS
0.5000 mg | ORAL_TABLET | ORAL | Status: DC | PRN
  Filled 2020-02-22: qty 1

## 2020-02-22 MED ORDER — GLYCOPYRROLATE 0.2 MG/ML IJ SOLN
0.2000 mg | INTRAMUSCULAR | Status: DC | PRN
  Filled 2020-02-22: qty 1

## 2020-02-22 MED ORDER — METOPROLOL TARTRATE 25 MG PO TABS
25.0000 mg | ORAL_TABLET | Freq: Two times a day (BID) | ORAL | Status: DC
  Filled 2020-02-22: qty 1

## 2020-02-22 MED ORDER — POLYVINYL ALCOHOL 1.4 % OP SOLN: 1.0000 [drp] | Freq: Four times a day (QID) | OPHTHALMIC | Status: DC | PRN

## 2020-02-22 MED ORDER — GLYCOPYRROLATE 0.2 MG/ML IJ SOLN
0.4000 mg | Freq: Three times a day (TID) | INTRAMUSCULAR | Status: DC
  Administered 2020-02-22 – 2020-02-24 (×6): 0.4 mg via INTRAVENOUS
  Filled 2020-02-22 (×9): qty 2

## 2020-02-22 MED ORDER — ACETAMINOPHEN 325 MG PO TABS: 650.0000 mg | ORAL_TABLET | Freq: Four times a day (QID) | ORAL | Status: DC | PRN

## 2020-02-22 MED ORDER — DILTIAZEM HCL 25 MG/5ML IV SOLN
5.0000 mg | Freq: Once | INTRAVENOUS | Status: AC
  Administered 2020-02-22: 5 mg via INTRAVENOUS
  Filled 2020-02-22: qty 5

## 2020-02-22 MED ORDER — LORAZEPAM 2 MG/ML IJ SOLN
0.5000 mg | INTRAMUSCULAR | Status: DC | PRN
  Administered 2020-02-24: 17:00:00 0.5 mg via INTRAVENOUS
  Filled 2020-02-22: qty 1

## 2020-02-22 MED ORDER — GLYCOPYRROLATE 1 MG PO TABS
1.0000 mg | ORAL_TABLET | ORAL | Status: DC | PRN
  Filled 2020-02-22: qty 1

## 2020-02-22 MED ORDER — HALOPERIDOL LACTATE 5 MG/ML IJ SOLN: 0.5000 mg | INTRAMUSCULAR | Status: DC | PRN

## 2020-02-22 MED ORDER — HYDROMORPHONE HCL 1 MG/ML IJ SOLN
0.5000 mg | INTRAMUSCULAR | Status: DC | PRN
Start: 2020-02-22 — End: 2020-02-24
  Administered 2020-02-22 – 2020-02-24 (×9): 1 mg via INTRAVENOUS
  Filled 2020-02-22 (×9): qty 1

## 2020-02-22 NOTE — Progress Notes (Signed)
Provider on call paged for HR remaining 150s-160s  with Amio drip infusing. Awaiting response.

## 2020-02-22 NOTE — Progress Notes (Signed)
Provider on call notified of HR remaining 150s with Amio drip infusing. Awaiting response.

## 2020-02-22 NOTE — Progress Notes (Signed)
Notified Dr. Bonner Puna pt HR sustaining in 150s on amiodarone drip. Scheduled metoprolol unable to be given d/t LOC

## 2020-02-22 NOTE — Progress Notes (Signed)
Palliative:  Full note to follow. Discussed with niece, Tammy, via telephone and again at bedside. Goals are for full comfort care. Family hopeful to be present at her side so she is not alone. Tammy tells me that Ms. Tamura is ready when God is ready for her and although she isn't "pushing to go" she is at peace.   Orders adjusted to reflect full comfort care.   Vinie Sill, NP Palliative Medicine Team Pager [phone] (Please see [email or web address] for schedule) Team Phone [phone]

## 2020-02-22 NOTE — Progress Notes (Signed)
Called bed placement to notify that patient was unable to transfer to 6th floor d/t positive COVID status. Confirmed COVID status was in transfer order

## 2020-02-22 NOTE — Progress Notes (Signed)
PROGRESS NOTE  [name]  IHK:[id number] DOB: [birth year] DOA: 02/20/2020 PCP: Harlan Stains, MD  Brief Narrative: Janice [name] is an 86 y.o. female with a history of PAF, T2DM, stage IIIa CKD, HTN, HLD, and dementia who was brought to the ED confused after being found down for an unknown period of time at home. She was found to have positive SARS-CoV-2 PCR without hypoxia or CXR infiltrates. Also has acute renal failure (Cr 4.66 up from baseline of ~1) with lactic acidosis, rhabdomyolysis (CK 2,078), troponin elevation to 667, multiple metabolic derangements including hypernatremia, hypokalemia, hypomagnesemia, and LFT elevations. She appeared dehydrated with neutrophilic leukocytosis (WBC 23.4k with >20% bands), CRP 15.2, PCT 0.88. She reportedly had a dark black stool which was +FOBT as well. Radiographs of the left hip and knee as well as CT head and CT cervical spine were nonacute  Vancomycin and zosyn were given with IV fluids for sepsis with renal failure, anticoagulation and rate control medications were held due to +FOBT and soft blood pressures. The patient remains confused with slightly improved blood pressure but has now entered into AFib with RVR. IV fluids have been given with persistent hypotension and the patient is developing acute hypoxic respiratory failure due to pulmonary edema. Goals of care discussions have been ongoing, on 12/9 facilitated by palliative care consultant. We will transition to full comfort measures, unrestrict visitation.   Assessment & Plan: Active Problems:   Atrial fibrillation (HCC)   Diabetes mellitus without complication (HCC)   Hypertension   Hyperlipidemia   CKD (chronic kidney disease) stage 3, GFR 30-59 ml/min (HCC)   Long term current use of anticoagulant therapy   Mild cognitive impairment   Rhabdomyolysis   Hypokalemia   AKI (acute kidney injury) (HCC)   Dehydration   Elevated lactic acid level   SIRS (systemic inflammatory  response syndrome) ([place])   COVID-19 virus infection   Melena   Acute renal failure (HCC)  Goals of care: Patient has a very high risk of inpatient mortality and has voiced the desire to be DNR and avoid aggressive interventions.  - Palliative care consult appreciated. Transition to comfort measures  Severe sepsis with lactic acidosis:  - Comfort measures  Acute renal failure on stage IIIa CKD with metabolic acidosis: Multifactorial due to rhabdomyolysis, prerenal azotemia/dehydration, sepsis.  - UOP remained very poor despite large volume fluid resuscitation. There is very little chance of meaningful renal recovery. Pt's goals of care would not include aggressive interventions which would include HD/CRRT. We will stop IV fluids and allow, but not force, po intake ad lib.  - Hold lasix/ARB.  PAF with RVR complicated by hypotension:  - Comfort measures.  Rhabdomyolysis, unwitnessed fall: CK improved w/IVF. - Comfort measures  Demand myocardial ischemia: Most recent catheterization showed no obstruction, pt denies chest pain and has no ischemic changes on ECG. Suspect this is related to sepsis complicated by renal failure. Troponins trending downward.  - Comfort measures  Positive FOBT, possible upper GI bleeding: BUN elevated though this could be due entirely to renal failure.  - Continue to hold anticoagulation/antiplatelet  Covid-19 infection: Perhaps triggered weakness which was inciting factor. No infiltrates on CXR or hypoxia, would not qualify currently for steroids and risk of renal impairment > potential benefits of remdesivir at this time. Family has declined monoclonal antibody treatment as well.   Acute hypoxic respiratory failure: Highest suspicion of etiology is due to pulmonary edema based on crackles, increasing edema in light of ongoing IV fluid  volume resuscitation and oliguric renal failure.  - Oxygen prn comfort - Dilaudid prn dyspnea.  Dementia: Pt is on aricept and  namenda.  - Discuss care with HCPOA.   T2DM: HbA1c 6.6%.  - SSI  Hypokalemia: Improved with supplementation.  Hypomagnesemia: Supplemented.  LFT elevation: Suspect primarily due to sepsis and/or covid and rhabdomyolysis. No abd tenderness.   DVT prophylaxis: None Code Status: DNR Family Communication: None at bedside. HCPOA discussed with palliative care colleague. Will call myself later. Disposition Plan:  Status is: Inpatient  Remains inpatient appropriate because:Hemodynamically unstable, Persistent severe electrolyte disturbances, Altered mental status, Ongoing diagnostic testing needed not appropriate for outpatient work up and IV treatments appropriate due to intensity of illness or inability to take PO  Dispo: The patient is from: Home              Anticipated d/c is to: TBD              Anticipated d/c date is: > 3 days              Patient currently is not medically stable to d/c.  Consultants:   Palliative care  Procedures:   None  Antimicrobials:  Vancomycin 12/8  Zosyn 12/8 - 12/10  Subjective: Remains confused, dislikes all interventions including BP cuff, developed hypoxia this morning but denies current dyspnea. HR remains in 150-160's and BP remains soft.    Objective: Vitals:   02/22/20 1100 02/22/20 1130 02/22/20 1253 02/22/20 1300  BP:  (!) 158/60  (!) 160/56  Pulse:    60  Resp:  (!) 24  (!) 30  Temp:   97.7 F (36.5 C)   TempSrc:   Oral   SpO2: 93%   90%  Weight:      Height:        Intake/Output Summary (Last 24 hours) at 02/22/2020 1355 Last data filed at 02/22/2020 9509 Gross per 24 hour  Intake 1378.72 ml  Output --  Net 1378.72 ml   Filed Weights   02/20/20 1532  Weight: 68 kg   Gen: Frail, elderly female in no distress Pulm: Nonlabored tachypnea with supplemental oxygen, crackles now. CV: Rapid irregular without murmur, rub, or gallop. No JVD, 2+ pitting dependent edema. GI: Abdomen soft, non-tender, non-distended, with  normoactive bowel sounds.  Ext: Warm, no deformities Skin: No new rashes, lesions or ulcers on visualized skin. Neuro: Confused, tracks with eyes, intermittently follows commands. Psych: Judgement and insight appear impaired.   Data Reviewed: I have personally reviewed following labs and imaging studies  CBC: Recent Labs  Lab 02/20/20 1355 02/21/20 0555 02/22/20 0339  WBC 23.4* 15.5* 15.7*  NEUTROABS 20.2* 12.8* 14.1*  HGB 17.2* 15.3* 14.4  HCT 53.9* 47.8* 44.5  MCV 94.2 94.8 93.7  PLT 193 123* 326*   Basic Metabolic Panel: Recent Labs  Lab 02/20/20 1355 02/20/20 1724 02/21/20 0555 02/21/20 1046 02/21/20 1616 02/22/20 0339  NA 146*  --   --  145 145 139  K 2.6*  --   --  3.4* 3.5 3.7  CL 111  --   --  111 113* 102  CO2 16*  --   --  18* 17* 17*  GLUCOSE 193*  --   --  176* 145* 368*  BUN 63*  --   --  79* 77* 74*  CREATININE 4.61*  --   --  4.88* 4.51* 4.31*  CALCIUM 7.9*  --   --  8.3* 8.1* 7.4*  MG  --  1.8 1.1*  --   --  2.4   GFR: Estimated Creatinine Clearance: 8.9 mL/min (A) (by C-G formula based on SCr of 4.31 mg/dL (H)). Liver Function Tests: Recent Labs  Lab 02/20/20 1355 02/21/20 1046 02/21/20 1616 02/22/20 0339  AST 106* 85* 75* 93*  ALT 31 31 30  32  ALKPHOS 95 86 82 77  BILITOT 1.6* 1.4* 1.2 1.8*  PROT 5.7* 5.5* 5.3* 4.9*  ALBUMIN 2.7* 2.6* 2.3* 2.1*   No results for input(s): LIPASE, AMYLASE in the last 168 hours. No results for input(s): AMMONIA in the last 168 hours. Coagulation Profile: Recent Labs  Lab 02/20/20 1455  INR 1.2   Cardiac Enzymes: Recent Labs  Lab 02/20/20 1355 02/21/20 0555  CKTOTAL 2,078* 632*   BNP (last 3 results) No results for input(s): PROBNP in the last 8760 hours. HbA1C: Recent Labs    02/20/20 1355  HGBA1C 6.6*   CBG: Recent Labs  Lab 02/21/20 2346 02/22/20 0350 02/22/20 0359 02/22/20 0823 02/22/20 1227  GLUCAP 155* 374* 141* 142* 208*   Lipid Profile: No results for input(s): CHOL, HDL,  LDLCALC, TRIG, CHOLHDL, LDLDIRECT in the last 72 hours. Thyroid Function Tests: No results for input(s): TSH, T4TOTAL, FREET4, T3FREE, THYROIDAB in the last 72 hours. Anemia Panel: Recent Labs    02/21/20 0555 02/22/20 0339  FERRITIN 175 126   Urine analysis:    Component Value Date/Time   COLORURINE AMBER (A) 02/20/2020 1355   APPEARANCEUR CLOUDY (A) 02/20/2020 1355   LABSPEC 1.018 02/20/2020 1355   PHURINE 5.0 02/20/2020 1355   GLUCOSEU 50 (A) 02/20/2020 1355   HGBUR LARGE (A) 02/20/2020 1355   BILIRUBINUR NEGATIVE 02/20/2020 1355   KETONESUR NEGATIVE 02/20/2020 1355   PROTEINUR 100 (A) 02/20/2020 1355   UROBILINOGEN 0.2 08/14/2009 1507   NITRITE NEGATIVE 02/20/2020 1355   LEUKOCYTESUR NEGATIVE 02/20/2020 1355   Recent Results (from the past 240 hour(s))  Urine Culture     Status: None   Collection Time: 02/20/20  1:55 PM   Specimen: Urine, Random  Result Value Ref Range Status   Specimen Description   Final    URINE, RANDOM Performed at [facility] [address] [place], [place] [address]    Special Requests   Final    NONE Performed at [facility], [place] [address], [place], [place] [address]    Culture   Final    NO GROWTH Performed at [facility], [place] [address] [place], [place] [address]    Report Status 02/22/2020 FINAL  Final  Blood culture (routine x 2)     Status: None (Preliminary result)   Collection Time: 02/20/20  3:45 PM   Specimen: BLOOD RIGHT FOREARM  Result Value Ref Range Status   Specimen Description   Final    BLOOD RIGHT FOREARM Performed at [facility] [address], [place], [place] [address]    Special Requests   Final    BOTTLES DRAWN AEROBIC AND ANAEROBIC Blood Culture adequate volume Performed at [facility] [address], [place], [place] [address]    Culture   Final    NO GROWTH 2 DAYS Performed at [facility] [address] [place], [place] [address]    Report Status PENDING  Incomplete  Blood culture (routine x 2)     Status: None (Preliminary result)   Collection Time: 02/20/20  3:45 PM   Specimen: BLOOD  Result Value Ref Range  Status   Specimen Description   Final    BLOOD LEFT ANTECUBITAL Performed at [facility] [address], [place], [place] [address]    Special Requests   Final    BOTTLES DRAWN AEROBIC AND ANAEROBIC Blood Culture adequate volume Performed at [facility] [address] [place], [place] [address]    Culture   Final    NO GROWTH 2 DAYS Performed at [facility] [address] [place], [place] [address]    Report Status PENDING  Incomplete  Resp Panel by RT-PCR (Flu A&B, Covid) Nasopharyngeal Swab     Status: Abnormal   Collection Time: 02/20/20  4:30 PM   Specimen: Nasopharyngeal Swab; Nasopharyngeal(NP) swabs in vial transport medium  Result Value Ref Range Status   SARS Coronavirus 2 by RT PCR POSITIVE (A) NEGATIVE Final    Comment: RESULT CALLED TO, READ BACK BY AND VERIFIED WITH: APONTE,R. RN @1800  02/20/20 BILLINGSLEY,L (NOTE) SARS-CoV-2 target nucleic acids are DETECTED.  The SARS-CoV-2 RNA is generally detectable in upper respiratory specimens during the acute phase of infection. Positive results are indicative of the presence of the identified virus, but do not rule out bacterial infection or co-infection with other pathogens not detected by the test. Clinical correlation with patient history and other diagnostic information is necessary to determine patient infection status. The expected result is Negative.  Fact Sheet for Patients: EntrepreneurPulse.com.au  Fact Sheet for Healthcare Providers: IncredibleEmployment.be  This test is not yet approved or cleared by the [place] FDA and  has been authorized for detection and/or diagnosis of SARS-CoV-2 by FDA under an  Emergency Use Authorization (EUA).  This EUA will remain in effect (meaning this test  can be used) for the duration of  the COVID-19 declaration under Section 564(b)(1) of the Act, 21 U.S.C. section 360bbb-3(b)(1), unless the authorization is terminated or revoked sooner.     Influenza A by PCR NEGATIVE NEGATIVE Final   Influenza B by PCR NEGATIVE NEGATIVE Final    Comment: (NOTE) The Xpert Xpress SARS-CoV-2/FLU/RSV plus assay is intended as an aid in the diagnosis of influenza from Nasopharyngeal swab specimens and should not be used as a sole basis for treatment. Nasal washings and aspirates are unacceptable for Xpert Xpress SARS-CoV-2/FLU/RSV testing.  Fact Sheet for Patients: EntrepreneurPulse.com.au  Fact Sheet for Healthcare Providers: IncredibleEmployment.be  This test is not yet approved or cleared by the [place] FDA and has been authorized for detection and/or diagnosis of SARS-CoV-2 by FDA under an Emergency Use Authorization (EUA). This EUA will remain in effect (meaning this test can be used) for the duration of the COVID-19 declaration under Section 564(b)(1) of the Act, 21 U.S.C. section 360bbb-3(b)(1), unless the authorization is terminated or revoked.  Performed at [facility], [place] [address] [place], [place] [address]   MRSA PCR Screening     Status: None   Collection Time: 02/21/20  2:00 AM   Specimen: Nasal Mucosa; Nasopharyngeal  Result Value Ref Range Status   MRSA by PCR NEGATIVE NEGATIVE Final    Comment:        The GeneXpert MRSA Assay (FDA approved for NASAL specimens only), is one component of a comprehensive MRSA colonization surveillance program. It is not intended to diagnose MRSA infection nor to guide or monitor treatment for MRSA infections. Performed at [facility], [place] [address] [place], [place] [address]       Radiology Studies: CT Head Wo  Contrast  Result Date: 02/20/2020 CLINICAL DATA:  Found on floor today. Ninety no x1. Does not remember fall. EXAM: CT HEAD WITHOUT CONTRAST CT CERVICAL SPINE WITHOUT CONTRAST TECHNIQUE: Multidetector CT imaging of the head and cervical spine was performed following the standard protocol without intravenous contrast. Multiplanar CT image reconstructions of the cervical spine were also generated. COMPARISON:  CT head and cervical spine 09/16/2014, chest x-ray 02/20/2020, CT chest 09/29/2012 FINDINGS: CT HEAD FINDINGS Brain: Cerebral ventricle sizes are concordant with the degree of cerebral volume loss. Patchy and confluent areas of decreased attenuation are noted throughout the deep and periventricular white matter of the cerebral hemispheres bilaterally, compatible with chronic microvascular ischemic disease. no evidence of large-territorial acute infarction. No parenchymal hemorrhage. No mass lesion. No extra-axial collection. No mass effect or midline shift. No hydrocephalus. Basilar cisterns are patent. Vascular: No hyperdense vessel. Skull: No acute fracture or focal lesion. Sinuses/Orbits: Paranasal sinuses and mastoid air cells are clear. Bilateral lens replacement. Otherwise the orbits are unremarkable. Other: None. CT CERVICAL SPINE FINDINGS Alignment: Normal. Skull base and vertebrae: Severe multilevel degenerative changes of the spine with osteophyte formation, facet arthropathy, uncovertebral arthropathy, intervertebral disc space narrowing. Osseous neural foraminal narrowing of the right C4-C5 level and bilateral C5-C6 levels. Redemonstration of central canal stenosis at the C4-C5 and C5-C6 levels. No acute fracture. No aggressive appearing focal osseous lesion or focal pathologic process. Soft tissues and spinal canal: No prevertebral fluid or swelling. No visible canal hematoma. Upper chest: Right apical 0.9 x 0.7 cm density. Other: Nuchal ligament calcifications. IMPRESSION: 1. No acute  intracranial abnormality. 2. No acute displaced fracture or traumatic listhesis. 3. Right apical lung 0.9 x 0.7 cm density. Finding could represent infection, inflammation, nodule. Electronically Signed   By: Iven Finn M.D.   On: 02/20/2020 15:51   CT Cervical Spine Wo Contrast  Result Date: 02/20/2020 CLINICAL DATA:  Found on floor today. Ninety no x1. Does not remember fall. EXAM: CT HEAD WITHOUT CONTRAST CT CERVICAL SPINE WITHOUT CONTRAST TECHNIQUE: Multidetector CT imaging of the head and cervical spine was performed following the standard protocol without intravenous contrast. Multiplanar CT image reconstructions of the cervical spine were also generated. COMPARISON:  CT head and cervical spine 09/16/2014, chest x-ray 02/20/2020, CT chest 09/29/2012 FINDINGS: CT HEAD FINDINGS Brain: Cerebral ventricle sizes are concordant with the degree of cerebral volume loss. Patchy and confluent areas of decreased attenuation are noted throughout the deep and periventricular white matter of the cerebral hemispheres bilaterally, compatible with chronic microvascular ischemic disease. no evidence of large-territorial acute infarction. No parenchymal hemorrhage. No mass lesion. No extra-axial collection. No mass effect or midline shift. No hydrocephalus. Basilar cisterns are patent. Vascular: No hyperdense vessel. Skull: No acute fracture or focal lesion. Sinuses/Orbits: Paranasal sinuses and mastoid air cells are clear. Bilateral lens replacement. Otherwise the orbits are unremarkable. Other: None. CT CERVICAL SPINE FINDINGS Alignment: Normal. Skull base and vertebrae: Severe multilevel degenerative changes of the spine with osteophyte formation, facet arthropathy, uncovertebral arthropathy, intervertebral disc space narrowing. Osseous neural foraminal narrowing of the right C4-C5 level and bilateral C5-C6 levels. Redemonstration of central canal stenosis at the C4-C5 and C5-C6 levels. No acute fracture. No  aggressive appearing focal osseous lesion or focal pathologic process. Soft tissues and spinal canal: No prevertebral fluid or swelling. No visible canal hematoma. Upper chest: Right apical 0.9 x 0.7 cm density. Other: Nuchal ligament calcifications. IMPRESSION: 1. No acute intracranial abnormality. 2. No acute displaced fracture or traumatic listhesis. 3. Right apical  lung 0.9 x 0.7 cm density. Finding could represent infection, inflammation, nodule. Electronically Signed   By: Iven Finn M.D.   On: 02/20/2020 15:51   US RENAL  Result Date: 02/21/2020 CLINICAL DATA:  Acute renal failure. EXAM: RENAL / URINARY TRACT ULTRASOUND COMPLETE COMPARISON:  03/05/2014 FINDINGS: Right Kidney: Renal measurements: 8.8 x 4.5 x 3.7 cm = volume: 77 mL. Echogenicity within normal limits. No mass or hydronephrosis visualized. Left Kidney: Renal measurements: 8.0 x 4.6 x 4.4 cm = volume: 84 mL. Echogenicity within normal limits. No mass or hydronephrosis visualized. Bladder: Appears normal for degree of bladder distention. Other: None. IMPRESSION: Unremarkable renal ultrasound. Electronically Signed   By: Primitivo Gauze M.D.   On: 02/21/2020 14:41   DG Chest Port 1 View  Result Date: 02/20/2020 CLINICAL DATA:  Unwitnessed fall EXAM: PORTABLE CHEST 1 VIEW COMPARISON:  04/13/2017 FINDINGS: The heart size and mediastinal contours are within normal limits. Both lungs are clear. The visualized skeletal structures are unremarkable. IMPRESSION: No active disease. Electronically Signed   By: Donavan Foil M.D.   On: 02/20/2020 15:48   DG Knee Complete 4 Views Left  Result Date: 02/20/2020 CLINICAL DATA:  Pain fall EXAM: LEFT KNEE - COMPLETE 4+ VIEW COMPARISON:  08/14/2015 FINDINGS: Slightly limited by positioning. No definitive fracture or malalignment. Moderate severe tricompartment arthritis. No large knee effusion. IMPRESSION: 1. No definite acute osseous abnormality. 2. Moderate severe tricompartment arthritis.  Electronically Signed   By: Donavan Foil M.D.   On: 02/20/2020 18:55   DG Hip Unilat W or Wo Pelvis 2-3 Views Left  Result Date: 02/20/2020 CLINICAL DATA:  Unwitnessed fall EXAM: DG HIP (WITH OR WITHOUT PELVIS) 2-3V LEFT COMPARISON:  08/14/2015 FINDINGS: Mild SI joint degenerative changes. Pubic symphysis and rami appear intact. Mild degenerative changes of the bilateral hips. No definite acute displaced fracture or malalignment. IMPRESSION: No definite acute osseous abnormality. Electronically Signed   By: Donavan Foil M.D.   On: 02/20/2020 15:47    Scheduled Meds: . chlorhexidine  15 mL Mouth Rinse BID  . Chlorhexidine Gluconate Cloth  6 each Topical Daily  . glycopyrrolate  0.4 mg Intravenous TID  . mouth rinse  15 mL Mouth Rinse q12n4p  . mouth rinse  15 mL Mouth Rinse q12n4p  . metoprolol tartrate  25 mg Oral BID  . pantoprazole (PROTONIX) IV  40 mg Intravenous Q24H  . sodium chloride flush  3 mL Intravenous Q12H   Continuous Infusions: . amiodarone 30 mg/hr (02/22/20 0955)  . dextrose 5 % 850 mL with potassium chloride 10 mEq, sodium bicarbonate 150 mEq infusion 110 mL/hr at 02/22/20 0957     LOS: 2 days   Time spent: 35 minutes.  Patrecia Pour, MD [facility] [email or web address] 02/22/2020, 1:55 PM

## 2020-02-22 NOTE — Consult Note (Signed)
Consultation Note Date: 02/22/2020   Patient Name: Janice [name]  DOB: 1932-01-17  MRN: [id number]  Age / Sex: 86 y.o., female  PCP: Harlan Stains, MD Referring Physician: Patrecia Pour, MD  Reason for Consultation: Establishing goals of care  HPI/Patient Profile: 86 y.o. female  with past medical history of mild cognitive impairment, atrial fibrillation on Eliquis, diabetes, CKD stage 3, HTN, HLD admitted on 02/20/2020 found down on floor at home where she lives alone and diagnosed with COVID infection with SIRS, rhabdomyolysis, severe dehydration, AF RVR, MI. Goals of care have been initiated with limitations for no dialysis, no vasopressors, and consideration of comfort measures.   Clinical Assessment and Goals of Care: Discussed with RN who reports that Janice [name] cries and moans with every touch and care interventions. She just seems overall uncomfortable and miserable. I discussed with niece, Tammy, via telephone and again at bedside. I explained that Janice [name] is very uncomfortable, renal failure, breathing shallow and fast, and I fear that her body is tired and not able to recover. She appears in distress and I recommend that we consider comfort measures for Janice [name] as I feel this will provide her with relief and minimize her suffering at this stage of her life. Tammy is tearful but verbalizes understanding. Goals are for full comfort care. Family hopeful to be present at her side so she is not alone. Tammy tells me that Janice [name] is ready when God is ready for her and although she isn't "pushing to go" she is at peace.   Family holding vigil at bedside. Janice [name] appears much more comfortable. All questions/concerns addressed. Emotional support provided. Discussed with RN and Dr. Bonner Puna.   Primary Decision Maker Niece Tammy    SUMMARY OF RECOMMENDATIONS   - Comfort care  Code  Status/Advance Care Planning:  DNR   Symptom Management:   Comfort care orders placed. As needed medications added. If she has ongoing symptoms requiring frequent medications could consider low dose dilaudid infusion for comfort.   Palliative Prophylaxis:   Aspiration, Bowel Regimen, Delirium Protocol, Frequent Pain Assessment, Oral Care and Turn Reposition  Additional Recommendations (Limitations, Scope, Preferences):  Full Comfort Care  Psycho-social/Spiritual:   Desire for further Chaplaincy support:yes  Additional Recommendations: Grief/Bereavement Support  Prognosis:   Hours - Days  Discharge Planning: Hospital death vs hospice facility.      Primary Diagnoses: Present on Admission: . Rhabdomyolysis . Hypokalemia . Mild cognitive impairment . Hypertension . Hyperlipidemia . CKD (chronic kidney disease) stage 3, GFR 30-59 ml/min (HCC) . Atrial fibrillation ([place]) . AKI (acute kidney injury) ([place]) . Dehydration . Elevated lactic acid level . SIRS (systemic inflammatory response syndrome) (HCC) . COVID-19 virus infection . Melena . Acute renal failure ([place])   I have reviewed the medical record, interviewed the patient and family, and examined the patient. The following aspects are pertinent.  Past Medical History:  Diagnosis Date  . Allergic rhinitis   . Cataracts, bilateral   . Chest pain   .  CKD (chronic kidney disease), stage III ([place])   . Dementia ([place])   . Diabetes mellitus   . Endometrial cancer ([place]) 2010  . GERD (gastroesophageal reflux disease)   . History of echocardiogram    Echo 3/19: EF 60-65, no RWMA, Gr 1 DD, normal RVSF, PASP 32  . Hyperlipidemia   . Hypertension   . Lymphedema    left leg  . Normal coronary arteries 2011  . Obesity   . Osteoarthritis   . PAF (paroxysmal atrial fibrillation) (HCC)    on Coumadin   Social History   Socioeconomic History  . Marital status: Widowed    Spouse name: Not on file  . Number of  children: 0  . Years of education: Not on file  . Highest education level: Not on file  Occupational History  . Occupation: Retired  Tobacco Use  . Smoking status: Never Smoker  . Smokeless tobacco: Never Used  Vaping Use  . Vaping Use: Never used  Substance and Sexual Activity  . Alcohol use: No  . Drug use: No  . Sexual activity: Not on file  Other Topics Concern  . Not on file  Social History Narrative  . Not on file   Social Determinants of Health   Financial Resource Strain: Not on file  Food Insecurity: Not on file  Transportation Needs: Not on file  Physical Activity: Not on file  Stress: Not on file  Social Connections: Not on file   Family History  Problem Relation Age of Onset  . Lung cancer Sister   . Colon cancer Mother   . Kidney cancer Father   . Colon cancer Sister        x2   Scheduled Meds: . chlorhexidine  15 mL Mouth Rinse BID  . Chlorhexidine Gluconate Cloth  6 each Topical Daily  . insulin aspart  0-9 Units Subcutaneous Q4H  . mouth rinse  15 mL Mouth Rinse q12n4p  . mouth rinse  15 mL Mouth Rinse q12n4p  . metoprolol tartrate  25 mg Oral BID  . pantoprazole (PROTONIX) IV  40 mg Intravenous Q24H  . sodium chloride flush  3 mL Intravenous Q12H   Continuous Infusions: . amiodarone 30 mg/hr (02/22/20 0955)  . dextrose 5 % 850 mL with potassium chloride 10 mEq, sodium bicarbonate 150 mEq infusion 110 mL/hr at 02/22/20 0957  . piperacillin-tazobactam (ZOSYN)  IV Stopped (02/22/20 0451)   PRN Meds:.acetaminophen, ondansetron **OR** ondansetron (ZOFRAN) IV Allergies  Allergen Reactions  . Demerol [Meperidine] Nausea And Vomiting and Other (See Comments)    And vertigo  . Hydrochlorothiazide     Other reaction(s): hyponatremia  . Lisinopril     Other reaction(s): cough  . Mirtazapine     Other reaction(s): memory difficulties  . Nebivolol Hcl     Other reaction(s): weak  . Valsartan     Other reaction(s): memory issues?   Review of  Systems  Unable to perform ROS: Acuity of condition    Physical Exam Constitutional:      General: She is not in acute distress.    Appearance: She is ill-appearing.  Cardiovascular:     Rate and Rhythm: Tachycardia present. Rhythm irregularly irregular.  Pulmonary:     Effort: Tachypnea present.     Comments: Shallow Abdominal:     General: Abdomen is flat.  Neurological:     Mental Status: She is confused.     Vital Signs: BP (!) 158/60   Pulse (!) 158  Temp 98.1 F (36.7 C) (Axillary)   Resp (!) 24   Ht 5\' 6"  (1.676 m)   Wt 68 kg   SpO2 93%   BMI 24.21 kg/m  Pain Scale: PAINAD       SpO2: SpO2: 93 % O2 Device:SpO2: 93 % O2 Flow Rate: .O2 Flow Rate (L/min): 4 L/min  IO: Intake/output summary:   Intake/Output Summary (Last 24 hours) at 02/22/2020 1153 Last data filed at 02/22/2020 2979 Gross per 24 hour  Intake 1378.72 ml  Output --  Net 1378.72 ml    LBM: Last BM Date: 02/21/20 Baseline Weight: Weight: 68 kg Most recent weight: Weight: 68 kg     Palliative Assessment/Data:     Time In/Out: 1230-1320, 1450-1510 Time Total: 70 min Greater than 50%  of this time was spent counseling and coordinating care related to the above assessment and plan.  Signed by: Vinie Sill, NP Palliative Medicine Team Pager # [phone] (M-F 8a-5p) Team Phone # [phone] (Nights/Weekends)

## 2020-02-22 NOTE — TOC Initial Note (Signed)
Transition of Care [facility]) - Initial/Assessment Note    Patient Details  Name: Janice [name] MRN: [id number] Date of Birth: 1931-05-28  Transition of Care [facility]) CM/SW Contact:    Leeroy Cha, RN Phone Number: 02/22/2020, 9:11 AM  Clinical Narrative:                  86 y.o. female with medical history significant of A.fib on Eliquis, Dm2, CKD, HTN, HLD    Presented with  Fall was found down on the floor unclear for how many days  Could be 1-2. Patient appears more confused. Found by her Niece Niece is primary caretaker she went out of town Thursday Have not seen her since Patient has not been taking any of her meds   Infectious risk factors:  Reports severe fatigue   Has   NOt been vaccinated against COVID    Initial COVID TEST    POSITIVE,  PLAN: To go home possibly with hhc for p.t. or may need s.t. snf for rehab Following for progression. Expected Discharge Plan: [place] Barriers to Discharge: No Barriers Identified   Patient Goals and CMS Choice Patient states their goals for this hospitalization and ongoing recovery are:: to go home CMS Medicare.gov Compare Post Acute Care list provided to:: Patient    Expected Discharge Plan and Services Expected Discharge Plan: [place]       Living arrangements for the past 2 months: Apartment                                      Prior Living Arrangements/Services Living arrangements for the past 2 months: Apartment Lives with:: Self Patient language and need for interpreter reviewed:: Yes Do you feel safe going back to the place where you live?: Yes      Need for Family Participation in Patient Care: Yes (Comment) Care giver support system in place?: Yes (comment)   Criminal Activity/Legal Involvement Pertinent to Current Situation/Hospitalization: No - Comment as needed  Activities of Daily Living Home Assistive Devices/Equipment: Eyeglasses,Dentures  (specify type) (lower partial - has lost them) ADL Screening (condition at time of admission) Patient's cognitive ability adequate to safely complete daily activities?: No Is the patient deaf or have difficulty hearing?: Yes Does the patient have difficulty seeing, even when wearing glasses/contacts?: No Does the patient have difficulty concentrating, remembering, or making decisions?: Yes Patient able to express need for assistance with ADLs?: No Does the patient have difficulty dressing or bathing?: Yes Independently performs ADLs?: No Communication: Independent Dressing (OT): Needs assistance Is this a change from baseline?: Change from baseline, expected to last >3 days Grooming: Needs assistance Is this a change from baseline?: Change from baseline, expected to last >3 days Feeding: Needs assistance Is this a change from baseline?: Change from baseline, expected to last >3 days Bathing: Needs assistance Is this a change from baseline?: Change from baseline, expected to last >3 days Toileting: Needs assistance Is this a change from baseline?: Change from baseline, expected to last >3days In/Out Bed: Needs assistance Is this a change from baseline?: Change from baseline, expected to last >3 days Walks in Home: Needs assistance Is this a change from baseline?: Change from baseline, expected to last >3 days Does the patient have difficulty walking or climbing stairs?: Yes Weakness of Legs: Both Weakness of Arms/Hands: Both  Permission Sought/Granted  Emotional Assessment Appearance:: Appears stated age Attitude/Demeanor/Rapport: Engaged Affect (typically observed): Calm Orientation: : Oriented to Place,Oriented to Self,Oriented to  Time,Oriented to Situation Alcohol / Substance Use: Not Applicable Psych Involvement: No (comment)  Admission diagnosis:  Rhabdomyolysis [M62.82] Hypokalemia [E87.6] Lactic acidosis [E87.2] ARF (acute renal failure) ([place])  [N17.9] Tachycardia [R00.0] Acute renal failure (HCC) [N17.9] Elevated troponin [R77.8] Fall, initial encounter [W19.XXXA] Traumatic rhabdomyolysis, initial encounter ([place]) [T79.6XXA] Leukocytosis, unspecified type [D72.829] COVID-19 [U07.1] Patient Active Problem List   Diagnosis Date Noted  . Acute renal failure ([place]) 02/21/2020  . Rhabdomyolysis 02/20/2020  . Hypokalemia 02/20/2020  . AKI (acute kidney injury) ([place]) 02/20/2020  . Dehydration 02/20/2020  . Elevated lactic acid level 02/20/2020  . SIRS (systemic inflammatory response syndrome) ([place]) 02/20/2020  . COVID-19 virus infection 02/20/2020  . Melena 02/20/2020  . Mild cognitive impairment 07/27/2016  . Chronic pain of left knee 04/27/2016  . Long term current use of anticoagulant therapy 09/27/2014  . CKD (chronic kidney disease) stage 3, GFR 30-59 ml/min (HCC) 09/20/2014  . Pelvic fracture ([place]) 09/17/2014  . Sacral fracture, closed ([place]) 09/17/2014  . Chest pain 06/22/2013  . Other dysphagia 07/24/2012  . Atrial fibrillation ([place])   . Diabetes mellitus without complication ([place])   . Hypertension   . Hyperlipidemia    PCP:  Harlan Stains, MD Pharmacy:   [facility], [address] [place] [address] Phone: [phone] Fax: [place], [address], [address] [place], [place] [address] Phone: [phone] Fax: [phone]     Social Determinants of Health (SDOH) Interventions    Readmission Risk Interventions No flowsheet data found.

## 2020-02-23 NOTE — Progress Notes (Addendum)
PROGRESS NOTE  Janice [name]  PIR:[id number] DOB: 1931-03-16 DOA: 02/20/2020 PCP: Harlan Stains, MD  Brief Narrative: Janice [name] is an 86 y.o. female with a history of PAF, T2DM, stage IIIa CKD, HTN, HLD, and dementia who was brought to the ED confused after being found down for an unknown period of time at home. She was found to have positive SARS-CoV-2 PCR without hypoxia or CXR infiltrates. Also has acute renal failure (Cr 4.66 up from baseline of ~1) with lactic acidosis, rhabdomyolysis (CK 2,078), troponin elevation to 667, multiple metabolic derangements including hypernatremia, hypokalemia, hypomagnesemia, and LFT elevations. She appeared dehydrated with neutrophilic leukocytosis (WBC 23.4k with >20% bands), CRP 15.2, PCT 0.88. She reportedly had a dark black stool which was +FOBT as well. Radiographs of the left hip and knee as well as CT head and CT cervical spine were nonacute  Vancomycin and zosyn were given with IV fluids for sepsis with renal failure, anticoagulation and rate control medications were held due to +FOBT and soft blood pressures. The patient remains confused with slightly improved blood pressure but has now entered into AFib with RVR. IV fluids have been given with persistent hypotension and the patient is developing acute hypoxic respiratory failure due to pulmonary edema. Goals of care discussions have been ongoing, on 12/9 facilitated by palliative care consultant. We will transition to full comfort measures, unrestrict visitation.   Assessment & Plan: Active Problems:   Atrial fibrillation (HCC)   Diabetes mellitus without complication (HCC)   Hypertension   Hyperlipidemia   CKD (chronic kidney disease) stage 3, GFR 30-59 ml/min (HCC)   Long term current use of anticoagulant therapy   Mild cognitive impairment   Rhabdomyolysis   Hypokalemia   AKI (acute kidney injury) (HCC)   Dehydration   Elevated lactic acid level   SIRS (systemic inflammatory  response syndrome) ([place])   COVID-19 virus infection   Melena   Acute renal failure (HCC)  Goals of care: Patient has a very high risk of inpatient mortality and has voiced the desire to be DNR and avoid aggressive interventions.  - Palliative care consult appreciated. Transition to comfort measures. Pt much more comfortable this AM.  Severe sepsis with lactic acidosis:  - Comfort measures  Acute renal failure on stage IIIa CKD with metabolic acidosis: Multifactorial due to rhabdomyolysis, prerenal azotemia/dehydration, sepsis.  - UOP remained very poor despite large volume fluid resuscitation. There is very little chance of meaningful renal recovery. Pt's goals of care would not include aggressive interventions which would include HD/CRRT. We will stop IV fluids and allow, but not force, po intake ad lib.  - Hold lasix/ARB.  PAF with RVR complicated by hypotension:  - Comfort measures. Can stop monitoring.  Rhabdomyolysis, unwitnessed fall: CK improved w/IVF. - Comfort measures  Demand myocardial ischemia: Most recent catheterization showed no obstruction, pt denies chest pain and has no ischemic changes on ECG. Suspect this is related to sepsis complicated by renal failure. Troponins trending downward.  - Comfort measures  Positive FOBT, possible upper GI bleeding: BUN elevated though this could be due entirely to renal failure.  - Continue to hold anticoagulation/antiplatelet  Covid-19 infection: Perhaps triggered weakness which was inciting factor. No infiltrates on CXR or hypoxia, would not qualify currently for steroids and risk of renal impairment > potential benefits of remdesivir at this time. Family has declined monoclonal antibody treatment as well.   Acute hypoxic respiratory failure: Highest suspicion of etiology is due to pulmonary edema based on  crackles, increasing edema in light of ongoing IV fluid volume resuscitation and oliguric renal failure.  - Oxygen prn comfort -  Dilaudid prn dyspnea.  Dementia: Pt is on aricept and namenda.  - Discuss care with HCPOA.   T2DM: HbA1c 6.6%.  - SSI  Hypokalemia: Improved with supplementation.  Hypomagnesemia: Supplemented.  LFT elevation: Suspect primarily due to sepsis and/or covid and rhabdomyolysis. No abd tenderness.   DVT prophylaxis: None Code Status: DNR Family Communication: None at bedside this AM. Called HCPOA yesterday PM without answer. Disposition Plan:  Status is: Inpatient  Remains inpatient appropriate because:Hemodynamically unstable, Persistent severe electrolyte disturbances, Altered mental status, Ongoing diagnostic testing needed not appropriate for outpatient work up and IV treatments appropriate due to intensity of illness or inability to take PO  Dispo: The patient is from: Home              Anticipated d/c is to: residential hospice preferred.  Death anticipated within hours to days              Patient currently is not medically stable to d/c.  Consultants:   Palliative care  Procedures:   None  Antimicrobials:  Vancomycin 12/8  Zosyn 12/8 - 12/10  Subjective: Pt minimally responsive, mouth wide open, appears comfortable in no distress.   Objective: Vitals:   02/23/20 0800 02/23/20 0900 02/23/20 1000 02/23/20 1100  BP:      Pulse: 76 77 77 73  Resp: (!) 26 15 16 17   Temp:      TempSrc:      SpO2: (!) 86% (!) 88% (!) 89% 90%  Weight:      Height:        Intake/Output Summary (Last 24 hours) at 02/23/2020 1147 Last data filed at 02/22/2020 1549 Gross per 24 hour  Intake 951.08 ml  Output --  Net 951.08 ml   Filed Weights   02/20/20 1532  Weight: 68 kg   Gen: Elderly frail female in no distress Pulm: Nonlabored. CV: Irregular, no JVD, 3+ dependent edema. GI: Abdomen soft, non-tender, non-distended, with normoactive bowel sounds.  Ext: Warm, dry Skin: No new rashes, lesions or ulcers on visualized skin. Neuro: Not cooperative with exam Psych:  Calm  Data Reviewed: I have personally reviewed following labs and imaging studies  CBC: Recent Labs  Lab 02/20/20 1355 02/21/20 0555 02/22/20 0339  WBC 23.4* 15.5* 15.7*  NEUTROABS 20.2* 12.8* 14.1*  HGB 17.2* 15.3* 14.4  HCT 53.9* 47.8* 44.5  MCV 94.2 94.8 93.7  PLT 193 123* 382*   Basic Metabolic Panel: Recent Labs  Lab 02/20/20 1355 02/20/20 1724 02/21/20 0555 02/21/20 1046 02/21/20 1616 02/22/20 0339  NA 146*  --   --  145 145 139  K 2.6*  --   --  3.4* 3.5 3.7  CL 111  --   --  111 113* 102  CO2 16*  --   --  18* 17* 17*  GLUCOSE 193*  --   --  176* 145* 368*  BUN 63*  --   --  79* 77* 74*  CREATININE 4.61*  --   --  4.88* 4.51* 4.31*  CALCIUM 7.9*  --   --  8.3* 8.1* 7.4*  MG  --  1.8 1.1*  --   --  2.4   GFR: Estimated Creatinine Clearance: 8.9 mL/min (A) (by C-G formula based on SCr of 4.31 mg/dL (H)). Liver Function Tests: Recent Labs  Lab 02/20/20 1355 02/21/20 1046 02/21/20 1616 02/22/20  0339  AST 106* 85* 75* 93*  ALT 31 31 30  32  ALKPHOS 95 86 82 77  BILITOT 1.6* 1.4* 1.2 1.8*  PROT 5.7* 5.5* 5.3* 4.9*  ALBUMIN 2.7* 2.6* 2.3* 2.1*   No results for input(s): LIPASE, AMYLASE in the last 168 hours. No results for input(s): AMMONIA in the last 168 hours. Coagulation Profile: Recent Labs  Lab 02/20/20 1455  INR 1.2   Cardiac Enzymes: Recent Labs  Lab 02/20/20 1355 02/21/20 0555  CKTOTAL 2,078* 632*   BNP (last 3 results) No results for input(s): PROBNP in the last 8760 hours. HbA1C: Recent Labs    02/20/20 1355  HGBA1C 6.6*   CBG: Recent Labs  Lab 02/21/20 2346 02/22/20 0350 02/22/20 0359 02/22/20 0823 02/22/20 1227  GLUCAP 155* 374* 141* 142* 208*   Lipid Profile: No results for input(s): CHOL, HDL, LDLCALC, TRIG, CHOLHDL, LDLDIRECT in the last 72 hours. Thyroid Function Tests: No results for input(s): TSH, T4TOTAL, FREET4, T3FREE, THYROIDAB in the last 72 hours. Anemia Panel: Recent Labs    02/21/20 0555  02/22/20 0339  FERRITIN 175 126   Urine analysis:    Component Value Date/Time   COLORURINE AMBER (A) 02/20/2020 1355   APPEARANCEUR CLOUDY (A) 02/20/2020 1355   LABSPEC 1.018 02/20/2020 1355   PHURINE 5.0 02/20/2020 1355   GLUCOSEU 50 (A) 02/20/2020 1355   HGBUR LARGE (A) 02/20/2020 1355   BILIRUBINUR NEGATIVE 02/20/2020 1355   KETONESUR NEGATIVE 02/20/2020 1355   PROTEINUR 100 (A) 02/20/2020 1355   UROBILINOGEN 0.2 08/14/2009 1507   NITRITE NEGATIVE 02/20/2020 1355   LEUKOCYTESUR NEGATIVE 02/20/2020 1355   Recent Results (from the past 240 hour(s))  Urine Culture     Status: None   Collection Time: 02/20/20  1:55 PM   Specimen: Urine, Random  Result Value Ref Range Status   Specimen Description   Final    URINE, RANDOM Performed at [facility] [address], [place], [place] [address]    Special Requests   Final    NONE Performed at [facility], [place] [address] [place], [place] [address]    Culture   Final    NO GROWTH Performed at [facility], [place] [address], [place], [place] [address]    Report Status 02/22/2020 FINAL  Final  Blood culture (routine x 2)     Status: None (Preliminary result)   Collection Time: 02/20/20  3:45 PM   Specimen: BLOOD RIGHT FOREARM  Result Value Ref Range Status   Specimen Description   Final    BLOOD RIGHT FOREARM Performed at [facility] [address] [place], [place] [address]    Special Requests   Final    BOTTLES DRAWN AEROBIC AND ANAEROBIC Blood Culture adequate volume Performed at [facility] [address], [place], [place] [address]    Culture   Final    NO GROWTH 3 DAYS Performed at [facility], [place] [address], [place], [place] [address]    Report Status PENDING  Incomplete  Blood culture (routine x 2)     Status: None (Preliminary result)   Collection Time: 02/20/20  3:45 PM   Specimen: BLOOD  Result Value Ref Range  Status   Specimen Description   Final    BLOOD LEFT ANTECUBITAL Performed at [facility] [address], [place], [place] [address]    Special Requests   Final    BOTTLES DRAWN AEROBIC AND  ANAEROBIC Blood Culture adequate volume Performed at [facility] [address] [place], [place] [address]    Culture   Final    NO GROWTH 3 DAYS Performed at [facility], [place] [address], [place], [place] [address]    Report Status PENDING  Incomplete  Resp Panel by RT-PCR (Flu A&B, Covid) Nasopharyngeal Swab     Status: Abnormal   Collection Time: 02/20/20  4:30 PM   Specimen: Nasopharyngeal Swab; Nasopharyngeal(NP) swabs in vial transport medium  Result Value Ref Range Status   SARS Coronavirus 2 by RT PCR POSITIVE (A) NEGATIVE Final    Comment: RESULT CALLED TO, READ BACK BY AND VERIFIED WITH: APONTE,R. RN @1800  02/20/20 BILLINGSLEY,L (NOTE) SARS-CoV-2 target nucleic acids are DETECTED.  The SARS-CoV-2 RNA is generally detectable in upper respiratory specimens during the acute phase of infection. Positive results are indicative of the presence of the identified virus, but do not rule out bacterial infection or co-infection with other pathogens not detected by the test. Clinical correlation with patient history and other diagnostic information is necessary to determine patient infection status. The expected result is Negative.  Fact Sheet for Patients: EntrepreneurPulse.com.au  Fact Sheet for Healthcare Providers: IncredibleEmployment.be  This test is not yet approved or cleared by the [place] FDA and  has been authorized for detection and/or diagnosis of SARS-CoV-2 by FDA under an Emergency Use Authorization (EUA).  This EUA will remain in effect (meaning this test  can be used) for the duration of  the COVID-19 declaration under Section 564(b)(1) of the Act, 21 U.S.C. section 360bbb-3(b)(1),  unless the authorization is terminated or revoked sooner.     Influenza A by PCR NEGATIVE NEGATIVE Final   Influenza B by PCR NEGATIVE NEGATIVE Final    Comment: (NOTE) The Xpert Xpress SARS-CoV-2/FLU/RSV plus assay is intended as an aid in the diagnosis of influenza from Nasopharyngeal swab specimens and should not be used as a sole basis for treatment. Nasal washings and aspirates are unacceptable for Xpert Xpress SARS-CoV-2/FLU/RSV testing.  Fact Sheet for Patients: EntrepreneurPulse.com.au  Fact Sheet for Healthcare Providers: IncredibleEmployment.be  This test is not yet approved or cleared by the [place] FDA and has been authorized for detection and/or diagnosis of SARS-CoV-2 by FDA under an Emergency Use Authorization (EUA). This EUA will remain in effect (meaning this test can be used) for the duration of the COVID-19 declaration under Section 564(b)(1) of the Act, 21 U.S.C. section 360bbb-3(b)(1), unless the authorization is terminated or revoked.  Performed at [facility], [place] [address], [place], [place] [address]   MRSA PCR Screening     Status: None   Collection Time: 02/21/20  2:00 AM   Specimen: Nasal Mucosa; Nasopharyngeal  Result Value Ref Range Status   MRSA by PCR NEGATIVE NEGATIVE Final    Comment:        The GeneXpert MRSA Assay (FDA approved for NASAL specimens only), is one component of a comprehensive MRSA colonization surveillance program. It is not intended to diagnose MRSA infection nor to guide or monitor treatment for MRSA infections. Performed at [facility], [place] [address], [place], [place] [address]       Radiology Studies: US RENAL  Result Date: 02/21/2020 CLINICAL DATA:  Acute renal failure. EXAM: RENAL / URINARY TRACT ULTRASOUND COMPLETE COMPARISON:  03/05/2014 FINDINGS: Right Kidney: Renal measurements: 8.8 x 4.5 x 3.7 cm = volume: 77 mL.  Echogenicity within normal limits. No mass or hydronephrosis  visualized. Left Kidney: Renal measurements: 8.0 x 4.6 x 4.4 cm = volume: 84 mL. Echogenicity within normal limits. No mass or hydronephrosis visualized. Bladder: Appears normal for degree of bladder distention. Other: None. IMPRESSION: Unremarkable renal ultrasound. Electronically Signed   By: Primitivo Gauze M.D.   On: 02/21/2020 14:41    Scheduled Meds: . chlorhexidine  15 mL Mouth Rinse BID  . Chlorhexidine Gluconate Cloth  6 each Topical Daily  . glycopyrrolate  0.4 mg Intravenous TID  . mouth rinse  15 mL Mouth Rinse q12n4p  . mouth rinse  15 mL Mouth Rinse q12n4p  . sodium chloride flush  3 mL Intravenous Q12H   Continuous Infusions:    LOS: 3 days   Time spent: 35 minutes.  Patrecia Pour, MD [facility] [email or web address] 02/23/2020, 11:47 AM

## 2020-02-23 NOTE — TOC Progression Note (Addendum)
Transition of Care [facility]) - Progression Note    Patient Details  Name: Janice [name] MRN: [id number] Date of Birth: [birth year]  [facility] of [facility]) CM/SW [place], LCSW Phone Number: 02/23/2020, 2:45 PM  Clinical Narrative:    CSW reached out to niece Tammy. CSW explain transition to residential hospice. Niece report understanding. Lynelle Smoke is agreeable to [facility]. CSW left message for hospital liaison and facilitate intake person. CSW waiting for a return call to coordinate the patient transfer to residential hospice.   Referral given to liaison Karen Kitchens.  Patient information under review.    Expected Discharge Plan: [place] Barriers to Discharge: Hospice Bed not available  Expected Discharge Plan and Services Expected Discharge Plan: [place]       Living arrangements for the past 2 months: Apartment                                       Social Determinants of Health (SDOH) Interventions    Readmission Risk Interventions No flowsheet data found.

## 2020-02-23 NOTE — Plan of Care (Signed)
°  Problem: Coping: °Goal: Level of anxiety will decrease °Outcome: Progressing °  °

## 2020-02-24 DIAGNOSIS — Z515 Encounter for palliative care: Secondary | ICD-10-CM

## 2020-02-24 MED ORDER — HYDROMORPHONE HCL 1 MG/ML IJ SOLN: 0.5000 mg | INTRAMUSCULAR | 0 refills | Status: AC | PRN

## 2020-02-24 NOTE — Progress Notes (Signed)
Discharged pt to [facility] via [facility]. Report called to RN at [facility]. 3 IV sites were left in for hospice.

## 2020-02-24 NOTE — Progress Notes (Addendum)
Pt has been reviewed and approved for admission to [facility]) [place].  Consents have been signed by pt niece Isabela Lions. [facility] is ready to accept pt at any time. Please call me with any questions and time for patient to transfer.  Lin Landsman, RN BSN [phone])

## 2020-02-24 NOTE — Discharge Summary (Signed)
Physician Discharge Summary  Janice [name] IEP:[id number] DOB: 1931-10-06 DOA: 02/20/2020  PCP: Harlan Stains, MD  Admit date: 02/20/2020 Discharge date: 02/24/2020  Admitted From: Home Disposition: Residential hospice   Recommendations for Outpatient Follow-up:  1. Comfort care  Discharge Condition: Stable for transfer CODE STATUS: DNR Diet recommendation: As tolerated   Brief/Interim Summary: Janice [name] is an 86 y.o. female with a history of PAF, T2DM, stage IIIa CKD, HTN, HLD, and dementia who was brought to the ED confused after being found down for an unknown period of time at home. She was found to have positive SARS-CoV-2 PCR without hypoxia or CXR infiltrates. Also has acute renal failure (Cr 4.66 up from baseline of ~1) with lactic acidosis, rhabdomyolysis (CK 2,078), troponin elevation to 667, multiple metabolic derangements including hypernatremia, hypokalemia, hypomagnesemia, and LFT elevations. She appeared dehydrated with neutrophilic leukocytosis (WBC 23.4k with >20% bands), CRP 15.2, PCT 0.88. She reportedly had a dark black stool which was +FOBT as well. Radiographs of the left hip and knee as well as CT head and CT cervical spine were nonacute  Vancomycin and zosyn were given with IV fluids for sepsis with renal failure, anticoagulation and rate control medications were held due to +FOBT and soft blood pressures. The patient remains confused with slightly improved blood pressure but has now entered into AFib with RVR. IV fluids have been given with persistent hypotension and the patient is developing acute hypoxic respiratory failure due to pulmonary edema. Goals of care discussions have been ongoing, on 12/9 facilitated by palliative care consultant. We will transition to full comfort measures, unrestrict visitation. Residential hospice placement is sought.   Discharge Diagnoses:  Active Problems:   Atrial fibrillation (HCC)   Diabetes mellitus without  complication (HCC)   Hypertension   Hyperlipidemia   CKD (chronic kidney disease) stage 3, GFR 30-59 ml/min (HCC)   Long term current use of anticoagulant therapy   Mild cognitive impairment   Rhabdomyolysis   Hypokalemia   AKI (acute kidney injury) (HCC)   Dehydration   Elevated lactic acid level   SIRS (systemic inflammatory response syndrome) ([place])   COVID-19 virus infection   Melena   Acute renal failure ([place])   Hospice care patient  Goals of care: Patient has a very high risk of inpatient mortality and has voiced the desire to be DNR and avoid aggressive interventions.  - Palliative care consult appreciated. Transition to comfort measures. Pt much more comfortable this AM.  Severe sepsis with lactic acidosis:  - Comfort measures  Acute renal failure on stage IIIa CKD with metabolic acidosis: Multifactorial due to rhabdomyolysis, prerenal azotemia/dehydration, sepsis.  - UOP remained very poor despite large volume fluid resuscitation. There is very little chance of meaningful renal recovery. Pt's goals of care would not include aggressive interventions which would include HD/CRRT. We will stop IV fluids and allow, but not force, po intake ad lib.  - Hold lasix/ARB.  PAF with RVR complicated by hypotension:  - Comfort measures. Can stop monitoring.  Rhabdomyolysis, unwitnessed fall: CK improved w/IVF. - Comfort measures  Demand myocardial ischemia: Most recent catheterization showed no obstruction, pt denies chest pain and has no ischemic changes on ECG. Suspect this is related to sepsis complicated by renal failure. Troponins trending downward.  - Comfort measures  Positive FOBT, possible upper GI bleeding: BUN elevated though this could be due entirely to renal failure.  - Continue to hold anticoagulation/antiplatelet  Covid-19 infection: Perhaps triggered weakness which was inciting factor. No  infiltrates on CXR or hypoxia, would not qualify currently for steroids  and risk of renal impairment > potential benefits of remdesivir at this time. Family declined monoclonal antibody treatment as well.   Acute hypoxic respiratory failure: Highest suspicion of etiology is due to pulmonary edema based on crackles, increasing edema in light of ongoing IV fluid volume resuscitation and oliguric renal failure.  - Oxygen prn comfort - Dilaudid prn dyspnea or evidence of pain  Acute metabolic encephalopathy on chronic dementia:   T2DM: HbA1c 6.6%.  - Comfort measures  Hypokalemia: Improved with supplementation.  Hypomagnesemia: Supplemented.  LFT elevation: Suspect primarily due to sepsis and/or covid and rhabdomyolysis. No abd tenderness.   Discharge Instructions  Allergies as of 02/24/2020      Reactions   Demerol [meperidine] Nausea And Vomiting, Other (See Comments)   And vertigo   Hydrochlorothiazide    Other reaction(s): hyponatremia   Lisinopril    Other reaction(s): cough   Mirtazapine    Other reaction(s): memory difficulties   Nebivolol Hcl    Other reaction(s): weak   Valsartan    Other reaction(s): memory issues?      Medication List    STOP taking these medications   donepezil 10 MG tablet Commonly known as: ARICEPT   DULoxetine 30 MG capsule Commonly known as: CYMBALTA   Eliquis 5 MG Tabs tablet Generic drug: apixaban   irbesartan 300 MG tablet Commonly known as: AVAPRO   memantine 5 MG tablet Commonly known as: NAMENDA   metoprolol tartrate 100 MG tablet Commonly known as: LOPRESSOR   omeprazole 20 MG capsule Commonly known as: PRILOSEC   simvastatin 20 MG tablet Commonly known as: ZOCOR   telmisartan 20 MG tablet Commonly known as: MICARDIS     TAKE these medications   acetaminophen 325 MG tablet Commonly known as: TYLENOL Take 650 mg by mouth every 6 (six) hours as needed for mild pain, fever or headache.   ASPERCREME LIDOCAINE EX Apply 1 application topically as needed (pain).   HYDROmorphone 1  MG/ML injection Commonly known as: DILAUDID Inject 0.5-1 mLs (0.5-1 mg total) into the vein every 2 (two) hours as needed for severe pain (sob).       Allergies  Allergen Reactions  . Demerol [Meperidine] Nausea And Vomiting and Other (See Comments)    And vertigo  . Hydrochlorothiazide     Other reaction(s): hyponatremia  . Lisinopril     Other reaction(s): cough  . Mirtazapine     Other reaction(s): memory difficulties  . Nebivolol Hcl     Other reaction(s): weak  . Valsartan     Other reaction(s): memory issues?    Consultations:  Palliative care  Procedures/Studies: CT Head Wo Contrast  Result Date: 02/20/2020 CLINICAL DATA:  Found on floor today. Ninety no x1. Does not remember fall. EXAM: CT HEAD WITHOUT CONTRAST CT CERVICAL SPINE WITHOUT CONTRAST TECHNIQUE: Multidetector CT imaging of the head and cervical spine was performed following the standard protocol without intravenous contrast. Multiplanar CT image reconstructions of the cervical spine were also generated. COMPARISON:  CT head and cervical spine 09/16/2014, chest x-ray 02/20/2020, CT chest 09/29/2012 FINDINGS: CT HEAD FINDINGS Brain: Cerebral ventricle sizes are concordant with the degree of cerebral volume loss. Patchy and confluent areas of decreased attenuation are noted throughout the deep and periventricular white matter of the cerebral hemispheres bilaterally, compatible with chronic microvascular ischemic disease. no evidence of large-territorial acute infarction. No parenchymal hemorrhage. No mass lesion. No extra-axial collection. No mass effect  or midline shift. No hydrocephalus. Basilar cisterns are patent. Vascular: No hyperdense vessel. Skull: No acute fracture or focal lesion. Sinuses/Orbits: Paranasal sinuses and mastoid air cells are clear. Bilateral lens replacement. Otherwise the orbits are unremarkable. Other: None. CT CERVICAL SPINE FINDINGS Alignment: Normal. Skull base and vertebrae: Severe  multilevel degenerative changes of the spine with osteophyte formation, facet arthropathy, uncovertebral arthropathy, intervertebral disc space narrowing. Osseous neural foraminal narrowing of the right C4-C5 level and bilateral C5-C6 levels. Redemonstration of central canal stenosis at the C4-C5 and C5-C6 levels. No acute fracture. No aggressive appearing focal osseous lesion or focal pathologic process. Soft tissues and spinal canal: No prevertebral fluid or swelling. No visible canal hematoma. Upper chest: Right apical 0.9 x 0.7 cm density. Other: Nuchal ligament calcifications. IMPRESSION: 1. No acute intracranial abnormality. 2. No acute displaced fracture or traumatic listhesis. 3. Right apical lung 0.9 x 0.7 cm density. Finding could represent infection, inflammation, nodule. Electronically Signed   By: Iven Finn M.D.   On: 02/20/2020 15:51   CT Cervical Spine Wo Contrast  Result Date: 02/20/2020 CLINICAL DATA:  Found on floor today. Ninety no x1. Does not remember fall. EXAM: CT HEAD WITHOUT CONTRAST CT CERVICAL SPINE WITHOUT CONTRAST TECHNIQUE: Multidetector CT imaging of the head and cervical spine was performed following the standard protocol without intravenous contrast. Multiplanar CT image reconstructions of the cervical spine were also generated. COMPARISON:  CT head and cervical spine 09/16/2014, chest x-ray 02/20/2020, CT chest 09/29/2012 FINDINGS: CT HEAD FINDINGS Brain: Cerebral ventricle sizes are concordant with the degree of cerebral volume loss. Patchy and confluent areas of decreased attenuation are noted throughout the deep and periventricular white matter of the cerebral hemispheres bilaterally, compatible with chronic microvascular ischemic disease. no evidence of large-territorial acute infarction. No parenchymal hemorrhage. No mass lesion. No extra-axial collection. No mass effect or midline shift. No hydrocephalus. Basilar cisterns are patent. Vascular: No hyperdense vessel.  Skull: No acute fracture or focal lesion. Sinuses/Orbits: Paranasal sinuses and mastoid air cells are clear. Bilateral lens replacement. Otherwise the orbits are unremarkable. Other: None. CT CERVICAL SPINE FINDINGS Alignment: Normal. Skull base and vertebrae: Severe multilevel degenerative changes of the spine with osteophyte formation, facet arthropathy, uncovertebral arthropathy, intervertebral disc space narrowing. Osseous neural foraminal narrowing of the right C4-C5 level and bilateral C5-C6 levels. Redemonstration of central canal stenosis at the C4-C5 and C5-C6 levels. No acute fracture. No aggressive appearing focal osseous lesion or focal pathologic process. Soft tissues and spinal canal: No prevertebral fluid or swelling. No visible canal hematoma. Upper chest: Right apical 0.9 x 0.7 cm density. Other: Nuchal ligament calcifications. IMPRESSION: 1. No acute intracranial abnormality. 2. No acute displaced fracture or traumatic listhesis. 3. Right apical lung 0.9 x 0.7 cm density. Finding could represent infection, inflammation, nodule. Electronically Signed   By: Iven Finn M.D.   On: 02/20/2020 15:51   US RENAL  Result Date: 02/21/2020 CLINICAL DATA:  Acute renal failure. EXAM: RENAL / URINARY TRACT ULTRASOUND COMPLETE COMPARISON:  03/05/2014 FINDINGS: Right Kidney: Renal measurements: 8.8 x 4.5 x 3.7 cm = volume: 77 mL. Echogenicity within normal limits. No mass or hydronephrosis visualized. Left Kidney: Renal measurements: 8.0 x 4.6 x 4.4 cm = volume: 84 mL. Echogenicity within normal limits. No mass or hydronephrosis visualized. Bladder: Appears normal for degree of bladder distention. Other: None. IMPRESSION: Unremarkable renal ultrasound. Electronically Signed   By: Primitivo Gauze M.D.   On: 02/21/2020 14:41   DG Chest Port 1 View  Result Date:  02/20/2020 CLINICAL DATA:  Unwitnessed fall EXAM: PORTABLE CHEST 1 VIEW COMPARISON:  04/13/2017 FINDINGS: The heart size and mediastinal  contours are within normal limits. Both lungs are clear. The visualized skeletal structures are unremarkable. IMPRESSION: No active disease. Electronically Signed   By: Donavan Foil M.D.   On: 02/20/2020 15:48   DG Knee Complete 4 Views Left  Result Date: 02/20/2020 CLINICAL DATA:  Pain fall EXAM: LEFT KNEE - COMPLETE 4+ VIEW COMPARISON:  08/14/2015 FINDINGS: Slightly limited by positioning. No definitive fracture or malalignment. Moderate severe tricompartment arthritis. No large knee effusion. IMPRESSION: 1. No definite acute osseous abnormality. 2. Moderate severe tricompartment arthritis. Electronically Signed   By: Donavan Foil M.D.   On: 02/20/2020 18:55   DG Hip Unilat W or Wo Pelvis 2-3 Views Left  Result Date: 02/20/2020 CLINICAL DATA:  Unwitnessed fall EXAM: DG HIP (WITH OR WITHOUT PELVIS) 2-3V LEFT COMPARISON:  08/14/2015 FINDINGS: Mild SI joint degenerative changes. Pubic symphysis and rami appear intact. Mild degenerative changes of the bilateral hips. No definite acute displaced fracture or malalignment. IMPRESSION: No definite acute osseous abnormality. Electronically Signed   By: Donavan Foil M.D.   On: 02/20/2020 15:47    Subjective: Pt appears comfortable, no overnight events noted. No family at bedside. Spoke with [facility]CP[facility] by phone this AM.  Discharge Exam: Vitals:   02/23/20 1300 02/24/20 0548  BP:  130/70  Pulse: 82 (!) 153  Resp: 16 20  Temp:  (!) 97.4 F (36.3 C)  SpO2: 91%    General: No distress Cardiovascular: Irreg Respiratory: Nonlabored  Labs: BNP (last 3 results) Recent Labs    02/21/20 1616  BNP 734.2*   Basic Metabolic Panel: Recent Labs  Lab 02/20/20 1355 02/20/20 1724 02/21/20 0555 02/21/20 1046 02/21/20 1616 02/22/20 0339  NA 146*  --   --  145 145 139  K 2.6*  --   --  3.4* 3.5 3.7  CL 111  --   --  111 113* 102  CO2 16*  --   --  18* 17* 17*  GLUCOSE 193*  --   --  176* 145* 368*  BUN 63*  --   --  79* 77* 74*  CREATININE 4.61*   --   --  4.88* 4.51* 4.31*  CALCIUM 7.9*  --   --  8.3* 8.1* 7.4*  MG  --  1.8 1.1*  --   --  2.4   Liver Function Tests: Recent Labs  Lab 02/20/20 1355 02/21/20 1046 02/21/20 1616 02/22/20 0339  AST 106* 85* 75* 93*  ALT 31 31 30  32  ALKPHOS 95 86 82 77  BILITOT 1.6* 1.4* 1.2 1.8*  PROT 5.7* 5.5* 5.3* 4.9*  ALBUMIN 2.7* 2.6* 2.3* 2.1*   No results for input(s): LIPASE, AMYLASE in the last 168 hours. No results for input(s): AMMONIA in the last 168 hours. CBC: Recent Labs  Lab 02/20/20 1355 02/21/20 0555 02/22/20 0339  WBC 23.4* 15.5* 15.7*  NEUTROABS 20.2* 12.8* 14.1*  HGB 17.2* 15.3* 14.4  HCT 53.9* 47.8* 44.5  MCV 94.2 94.8 93.7  PLT 193 123* 123*   Cardiac Enzymes: Recent Labs  Lab 02/20/20 1355 02/21/20 0555  CKTOTAL 2,078* 632*   BNP: Invalid input(s): POCBNP CBG: Recent Labs  Lab 02/21/20 2346 02/22/20 0350 02/22/20 0359 02/22/20 0823 02/22/20 1227  GLUCAP 155* 374* 141* 142* 208*   D-Dimer Recent Labs    02/22/20 0339  DDIMER 5.48*   Hgb A1c No results for input(s):  HGBA1C in the last 72 hours. Lipid Profile No results for input(s): CHOL, HDL, LDLCALC, TRIG, CHOLHDL, LDLDIRECT in the last 72 hours. Thyroid function studies No results for input(s): TSH, T4TOTAL, T3FREE, THYROIDAB in the last 72 hours.  Invalid input(s): FREET3 Anemia work up Recent Labs    02/22/20 0339  FERRITIN 126   Urinalysis    Component Value Date/Time   COLORURINE AMBER (A) 02/20/2020 1355   APPEARANCEUR CLOUDY (A) 02/20/2020 1355   LABSPEC 1.018 02/20/2020 1355   PHURINE 5.0 02/20/2020 1355   GLUCOSEU 50 (A) 02/20/2020 1355   HGBUR LARGE (A) 02/20/2020 1355   BILIRUBINUR NEGATIVE 02/20/2020 1355   KETONESUR NEGATIVE 02/20/2020 1355   PROTEINUR 100 (A) 02/20/2020 1355   UROBILINOGEN 0.2 08/14/2009 1507   NITRITE NEGATIVE 02/20/2020 1355   LEUKOCYTESUR NEGATIVE 02/20/2020 1355    Microbiology Recent Results (from the past 240 hour(s))  Urine  Culture     Status: None   Collection Time: 02/20/20  1:55 PM   Specimen: Urine, Random  Result Value Ref Range Status   Specimen Description   Final    URINE, RANDOM Performed at [facility], [place] [address], [place], [place] [address]    Special Requests   Final    NONE Performed at [facility], [place] [address], [place], [place] [address]    Culture   Final    NO GROWTH Performed at [facility], [place] [address], [place], [place] [address]    Report Status 02/22/2020 FINAL  Final  Blood culture (routine x 2)     Status: None (Preliminary result)   Collection Time: 02/20/20  3:45 PM   Specimen: BLOOD RIGHT FOREARM  Result Value Ref Range Status   Specimen Description   Final    BLOOD RIGHT FOREARM Performed at [place] [address], [place], [place] [address]    Special Requests   Final    BOTTLES DRAWN AEROBIC AND ANAEROBIC Blood Culture adequate volume Performed at [place] [address] [place], [place] [address]    Culture   Final    NO GROWTH 3 DAYS Performed at [facility], [place] [address], [place], [place] [address]    Report Status PENDING  Incomplete  Blood culture (routine x 2)     Status: None (Preliminary result)   Collection Time: 02/20/20  3:45 PM   Specimen: BLOOD  Result Value Ref Range Status   Specimen Description   Final    BLOOD LEFT ANTECUBITAL Performed at [place] [address] [place], [place] [address]    Special Requests   Final    BOTTLES DRAWN AEROBIC AND ANAEROBIC Blood Culture adequate volume Performed at [place] [address], [place], [place] [address]    Culture   Final    NO GROWTH 3 DAYS Performed at [facility], [place] [address], [place], [place] [address]    Report Status PENDING  Incomplete  Resp Panel by RT-PCR (Flu A&B, Covid) Nasopharyngeal Swab     Status: Abnormal    Collection Time: 02/20/20  4:30 PM   Specimen: Nasopharyngeal Swab; Nasopharyngeal(NP) swabs in vial transport medium  Result Value Ref Range Status   SARS Coronavirus 2 by RT PCR POSITIVE (A) NEGATIVE Final    Comment: RESULT CALLED TO, READ BACK BY AND VERIFIED WITH: APONTE,R. RN @1800  02/20/20 BILLINGSLEY,L (NOTE) SARS-CoV-2 target nucleic acids are DETECTED.  The SARS-CoV-2 RNA is generally detectable in upper respiratory specimens during the acute phase of infection. Positive results are indicative of the presence of the identified virus, but do not rule out bacterial infection or co-infection with other pathogens not detected by the test. Clinical correlation with patient history and other diagnostic information is necessary to determine patient infection status. The expected result is Negative.  Fact Sheet for Patients: EntrepreneurPulse.com.au  Fact Sheet for Healthcare Providers: IncredibleEmployment.be  This test is not yet approved or cleared by the [place] FDA and  has been authorized for detection and/or diagnosis of SARS-CoV-2 by FDA under an Emergency Use Authorization (EUA).  This EUA will remain in effect (meaning this test  can be used) for the duration of  the COVID-19 declaration under Section 564(b)(1) of the Act, 21 U.S.C. section 360bbb-3(b)(1), unless the authorization is terminated or revoked sooner.     Influenza A by PCR NEGATIVE NEGATIVE Final   Influenza B by PCR NEGATIVE NEGATIVE Final    Comment: (NOTE) The Xpert Xpress SARS-CoV-2/FLU/RSV plus assay is intended as an aid in the diagnosis of influenza from Nasopharyngeal swab specimens and should not be used as a sole basis for treatment. Nasal washings and aspirates are unacceptable for Xpert Xpress SARS-CoV-2/FLU/RSV testing.  Fact Sheet for Patients: EntrepreneurPulse.com.au  Fact Sheet for Healthcare  Providers: IncredibleEmployment.be  This test is not yet approved or cleared by the [place] FDA and has been authorized for detection and/or diagnosis of SARS-CoV-2 by FDA under an Emergency Use Authorization (EUA). This EUA will remain in effect (meaning this test can be used) for the duration of the COVID-19 declaration under Section 564(b)(1) of the Act, 21 U.S.C. section 360bbb-3(b)(1), unless the authorization is terminated or revoked.  Performed at [facility], [place] [address], [place], [place] [address]   MRSA PCR Screening     Status: None   Collection Time: 02/21/20  2:00 AM   Specimen: Nasal Mucosa; Nasopharyngeal  Result Value Ref Range Status   MRSA by PCR NEGATIVE NEGATIVE Final    Comment:        The GeneXpert MRSA Assay (FDA approved for NASAL specimens only), is one component of a comprehensive MRSA colonization surveillance program. It is not intended to diagnose MRSA infection nor to guide or monitor treatment for MRSA infections. Performed at [facility], [place] [address], [place], [place] [address]     Time coordinating discharge: Approximately 40 minutes  Patrecia Pour, MD  [facility] 02/24/2020, 11:32 AM

## 2020-02-24 NOTE — TOC Transition Note (Signed)
Transition of Care [facility]) - CM/SW Discharge Note   Patient Details  Name: Janice [name] MRN: [id number] Date of Birth: 07/01/1931  Transition of Care [facility]) CM/SW Contact:  Ova Freshwater Phone Number: [phone] 02/24/2020, 4:05 PM   Clinical Narrative:     Patient will d/c to [place], [place], report# [phone]. Room# COVID Unit.  Attending and Unit RN notified.  Facesheet, [employer] in binder.  DNR form pending Attending signature, Attending notified.  [facility] contacted for transport.  TOC consult completed.   Final next level of care: [place] Barriers to Discharge: Hospice Bed not available   Patient Goals and CMS Choice Patient states their goals for this hospitalization and ongoing recovery are:: to go home CMS Medicare.gov Compare Post Acute Care list provided to:: Patient    Discharge Placement                       Discharge Plan and Services                                     Social Determinants of Health (SDOH) Interventions     Readmission Risk Interventions No flowsheet data found.

## 2020-02-25 DIAGNOSIS — E1121 Type 2 diabetes mellitus with diabetic nephropathy: Secondary | ICD-10-CM | POA: Diagnosis not present

## 2020-02-25 DIAGNOSIS — M1612 Unilateral primary osteoarthritis, left hip: Secondary | ICD-10-CM | POA: Diagnosis not present

## 2020-02-25 DIAGNOSIS — E785 Hyperlipidemia, unspecified: Secondary | ICD-10-CM | POA: Diagnosis not present

## 2020-02-25 DIAGNOSIS — F325 Major depressive disorder, single episode, in full remission: Secondary | ICD-10-CM | POA: Diagnosis not present

## 2020-02-25 DIAGNOSIS — I129 Hypertensive chronic kidney disease with stage 1 through stage 4 chronic kidney disease, or unspecified chronic kidney disease: Secondary | ICD-10-CM | POA: Diagnosis not present

## 2020-02-25 DIAGNOSIS — K219 Gastro-esophageal reflux disease without esophagitis: Secondary | ICD-10-CM | POA: Diagnosis not present

## 2020-02-25 DIAGNOSIS — I480 Paroxysmal atrial fibrillation: Secondary | ICD-10-CM | POA: Diagnosis not present

## 2020-02-25 DIAGNOSIS — M160 Bilateral primary osteoarthritis of hip: Secondary | ICD-10-CM | POA: Diagnosis not present

## 2020-02-25 DIAGNOSIS — N183 Chronic kidney disease, stage 3 unspecified: Secondary | ICD-10-CM | POA: Diagnosis not present

## 2020-02-25 LAB — CULTURE, BLOOD (ROUTINE X 2)
Culture: NO GROWTH
Culture: NO GROWTH
Special Requests: ADEQUATE
Special Requests: ADEQUATE
